# Patient Record
Sex: Female | Born: 1940 | Race: Black or African American | Hispanic: No | Smoking: Never smoker
Health system: Southern US, Community
[De-identification: ages and names within clinical notes are randomized; demographics above are authoritative.]

## PROBLEM LIST (undated history)

## (undated) DIAGNOSIS — E785 Hyperlipidemia, unspecified: Secondary | ICD-10-CM

## (undated) DIAGNOSIS — Z923 Personal history of irradiation: Secondary | ICD-10-CM

## (undated) DIAGNOSIS — G4730 Sleep apnea, unspecified: Secondary | ICD-10-CM

## (undated) DIAGNOSIS — H9190 Unspecified hearing loss, unspecified ear: Secondary | ICD-10-CM

## (undated) DIAGNOSIS — T7840XA Allergy, unspecified, initial encounter: Secondary | ICD-10-CM

## (undated) DIAGNOSIS — Z973 Presence of spectacles and contact lenses: Secondary | ICD-10-CM

## (undated) DIAGNOSIS — E079 Disorder of thyroid, unspecified: Secondary | ICD-10-CM

## (undated) DIAGNOSIS — J302 Other seasonal allergic rhinitis: Secondary | ICD-10-CM

## (undated) DIAGNOSIS — C50919 Malignant neoplasm of unspecified site of unspecified female breast: Secondary | ICD-10-CM

## (undated) DIAGNOSIS — H269 Unspecified cataract: Secondary | ICD-10-CM

## (undated) DIAGNOSIS — G709 Myoneural disorder, unspecified: Secondary | ICD-10-CM

## (undated) DIAGNOSIS — K219 Gastro-esophageal reflux disease without esophagitis: Secondary | ICD-10-CM

## (undated) DIAGNOSIS — H409 Unspecified glaucoma: Secondary | ICD-10-CM

## (undated) DIAGNOSIS — M1990 Unspecified osteoarthritis, unspecified site: Secondary | ICD-10-CM

## (undated) DIAGNOSIS — C801 Malignant (primary) neoplasm, unspecified: Secondary | ICD-10-CM

## (undated) DIAGNOSIS — D649 Anemia, unspecified: Secondary | ICD-10-CM

## (undated) DIAGNOSIS — F419 Anxiety disorder, unspecified: Secondary | ICD-10-CM

## (undated) HISTORY — DX: Sleep apnea, unspecified: G47.30

## (undated) HISTORY — PX: LEFT OOPHORECTOMY: SHX1961

## (undated) HISTORY — PX: COLONOSCOPY: SHX174

## (undated) HISTORY — DX: Myoneural disorder, unspecified: G70.9

## (undated) HISTORY — DX: Disorder of thyroid, unspecified: E07.9

## (undated) HISTORY — DX: Gastro-esophageal reflux disease without esophagitis: K21.9

## (undated) HISTORY — DX: Anemia, unspecified: D64.9

## (undated) HISTORY — DX: Unspecified glaucoma: H40.9

## (undated) HISTORY — PX: BREAST LUMPECTOMY: SHX2

## (undated) HISTORY — DX: Unspecified cataract: H26.9

## (undated) HISTORY — DX: Anxiety disorder, unspecified: F41.9

## (undated) HISTORY — PX: EYE SURGERY: SHX253

## (undated) HISTORY — PX: ABDOMINAL HYSTERECTOMY: SHX81

## (undated) HISTORY — DX: Allergy, unspecified, initial encounter: T78.40XA

## (undated) HISTORY — DX: Hyperlipidemia, unspecified: E78.5

---

## 2000-12-29 ENCOUNTER — Ambulatory Visit (HOSPITAL_COMMUNITY): Admission: RE | Admit: 2000-12-29 | Discharge: 2000-12-29 | Payer: Self-pay | Admitting: Gastroenterology

## 2003-04-26 HISTORY — PX: BREAST EXCISIONAL BIOPSY: SUR124

## 2003-07-04 LAB — HM COLONOSCOPY: HM Colonoscopy: NORMAL

## 2006-04-25 LAB — HM COLONOSCOPY: HM Colonoscopy: NORMAL

## 2008-12-09 ENCOUNTER — Encounter (INDEPENDENT_AMBULATORY_CARE_PROVIDER_SITE_OTHER): Payer: Self-pay | Admitting: *Deleted

## 2008-12-22 ENCOUNTER — Ambulatory Visit: Payer: Self-pay | Admitting: Family Medicine

## 2008-12-22 DIAGNOSIS — J029 Acute pharyngitis, unspecified: Secondary | ICD-10-CM

## 2008-12-22 DIAGNOSIS — M542 Cervicalgia: Secondary | ICD-10-CM

## 2008-12-22 DIAGNOSIS — E785 Hyperlipidemia, unspecified: Secondary | ICD-10-CM | POA: Insufficient documentation

## 2008-12-22 DIAGNOSIS — R42 Dizziness and giddiness: Secondary | ICD-10-CM

## 2008-12-22 DIAGNOSIS — M810 Age-related osteoporosis without current pathological fracture: Secondary | ICD-10-CM | POA: Insufficient documentation

## 2008-12-22 DIAGNOSIS — J309 Allergic rhinitis, unspecified: Secondary | ICD-10-CM | POA: Insufficient documentation

## 2008-12-24 ENCOUNTER — Telehealth (INDEPENDENT_AMBULATORY_CARE_PROVIDER_SITE_OTHER): Payer: Self-pay | Admitting: *Deleted

## 2008-12-24 ENCOUNTER — Encounter: Payer: Self-pay | Admitting: Family Medicine

## 2008-12-24 LAB — CONVERTED CEMR LAB
ALT: 18 units/L (ref 0–35)
AST: 21 units/L (ref 0–37)
BUN: 16 mg/dL (ref 6–23)
CO2: 28 meq/L (ref 19–32)
Chloride: 110 meq/L (ref 96–112)
Creatinine, Ser: 1.1 mg/dL (ref 0.4–1.2)
Direct LDL: 169.6 mg/dL
Eosinophils Relative: 1.7 % (ref 0.0–5.0)
Free T4: 0.8 ng/dL (ref 0.6–1.6)
Glucose, Bld: 90 mg/dL (ref 70–99)
HCT: 36.9 % (ref 36.0–46.0)
HDL: 62.1 mg/dL (ref >39.00)
MCHC: 33.6 g/dL (ref 30.0–36.0)
Monocytes Absolute: 0.5 10*3/uL (ref 0.1–1.0)
Monocytes Relative: 11.3 % (ref 3.0–12.0)
Neutrophils Relative %: 39.8 % — ABNORMAL LOW (ref 43.0–77.0)
Platelets: 221 10*3/uL (ref 150.0–400.0)
RDW: 14 % (ref 11.5–14.6)
Sodium: 142 meq/L (ref 135–145)
T3, Free: 2.7 pg/mL (ref 2.3–4.2)
TSH: 5.2 microintl units/mL (ref 0.35–5.50)
Total Protein: 7.6 g/dL (ref 6.0–8.3)
VLDL: 16.8 mg/dL (ref 0.0–40.0)

## 2009-02-10 ENCOUNTER — Ambulatory Visit: Payer: Self-pay | Admitting: Family Medicine

## 2009-02-10 DIAGNOSIS — E559 Vitamin D deficiency, unspecified: Secondary | ICD-10-CM | POA: Insufficient documentation

## 2009-02-10 DIAGNOSIS — D239 Other benign neoplasm of skin, unspecified: Secondary | ICD-10-CM | POA: Insufficient documentation

## 2009-02-11 ENCOUNTER — Encounter: Payer: Self-pay | Admitting: Family Medicine

## 2009-02-11 ENCOUNTER — Telehealth (INDEPENDENT_AMBULATORY_CARE_PROVIDER_SITE_OTHER): Payer: Self-pay | Admitting: *Deleted

## 2009-03-05 ENCOUNTER — Encounter: Admission: RE | Admit: 2009-03-05 | Discharge: 2009-03-05 | Payer: Self-pay | Admitting: Family Medicine

## 2009-03-05 ENCOUNTER — Encounter: Payer: Self-pay | Admitting: Internal Medicine

## 2009-03-06 ENCOUNTER — Encounter: Payer: Self-pay | Admitting: Internal Medicine

## 2009-12-02 ENCOUNTER — Ambulatory Visit: Payer: Self-pay | Admitting: Family Medicine

## 2009-12-02 DIAGNOSIS — E039 Hypothyroidism, unspecified: Secondary | ICD-10-CM

## 2009-12-03 ENCOUNTER — Telehealth: Payer: Self-pay | Admitting: Family Medicine

## 2009-12-03 LAB — CONVERTED CEMR LAB
ALT: 16 units/L (ref 0–35)
Alkaline Phosphatase: 35 units/L — ABNORMAL LOW (ref 39–117)
Basophils Absolute: 0 10*3/uL (ref 0.0–0.1)
Basophils Relative: 0.3 % (ref 0.0–3.0)
Bilirubin, Direct: 0.1 mg/dL (ref 0.0–0.3)
CO2: 27 meq/L (ref 19–32)
Calcium: 9 mg/dL (ref 8.4–10.5)
Chloride: 105 meq/L (ref 96–112)
Eosinophils Absolute: 0.1 10*3/uL (ref 0.0–0.7)
Lymphocytes Relative: 48.1 % — ABNORMAL HIGH (ref 12.0–46.0)
Lymphs Abs: 2.2 10*3/uL (ref 0.7–4.0)
MCHC: 33.6 g/dL (ref 30.0–36.0)
Monocytes Absolute: 0.5 10*3/uL (ref 0.1–1.0)
Monocytes Relative: 10.7 % (ref 3.0–12.0)
Neutro Abs: 1.8 10*3/uL (ref 1.4–7.7)
RBC: 3.89 M/uL (ref 3.87–5.11)
RDW: 15.4 % — ABNORMAL HIGH (ref 11.5–14.6)
Total Bilirubin: 0.5 mg/dL (ref 0.3–1.2)
Total CHOL/HDL Ratio: 3
Total Protein: 6.7 g/dL (ref 6.0–8.3)
Triglycerides: 46 mg/dL (ref 0.0–149.0)
VLDL: 9.2 mg/dL (ref 0.0–40.0)
WBC: 4.5 10*3/uL (ref 4.5–10.5)

## 2010-03-25 ENCOUNTER — Ambulatory Visit: Payer: Self-pay | Admitting: Family Medicine

## 2010-03-25 ENCOUNTER — Other Ambulatory Visit
Admission: RE | Admit: 2010-03-25 | Discharge: 2010-03-25 | Payer: Self-pay | Source: Home / Self Care | Admitting: Family Medicine

## 2010-03-25 DIAGNOSIS — M21619 Bunion of unspecified foot: Secondary | ICD-10-CM

## 2010-03-25 LAB — CONVERTED CEMR LAB
Alkaline Phosphatase: 38 units/L — ABNORMAL LOW (ref 39–117)
BUN: 14 mg/dL (ref 6–23)
Basophils Absolute: 0 10*3/uL (ref 0.0–0.1)
Basophils Relative: 0.3 % (ref 0.0–3.0)
Bilirubin, Direct: 0.1 mg/dL (ref 0.0–0.3)
CO2: 30 meq/L (ref 19–32)
Calcium: 9.2 mg/dL (ref 8.4–10.5)
Chloride: 103 meq/L (ref 96–112)
Cholesterol: 171 mg/dL (ref 0–200)
Creatinine, Ser: 1.1 mg/dL (ref 0.4–1.2)
Eosinophils Relative: 1.7 % (ref 0.0–5.0)
GFR calc non Af Amer: 66.33 mL/min (ref >60)
Glucose, Bld: 88 mg/dL (ref 70–99)
HCT: 36.8 % (ref 36.0–46.0)
LDL Cholesterol: 95 mg/dL (ref 0–99)
Lymphocytes Relative: 44.6 % (ref 12.0–46.0)
Lymphs Abs: 1.8 10*3/uL (ref 0.7–4.0)
Neutro Abs: 1.7 10*3/uL (ref 1.4–7.7)
Neutrophils Relative %: 42.5 % — ABNORMAL LOW (ref 43.0–77.0)
Potassium: 4.3 meq/L (ref 3.5–5.1)
RDW: 15.1 % — ABNORMAL HIGH (ref 11.5–14.6)
Sodium: 139 meq/L (ref 135–145)
Total Bilirubin: 0.7 mg/dL (ref 0.3–1.2)
Total Protein: 7.2 g/dL (ref 6.0–8.3)
VLDL: 9.4 mg/dL (ref 0.0–40.0)

## 2010-03-26 LAB — CONVERTED CEMR LAB: Vit D, 25-Hydroxy: 49 ng/mL

## 2010-03-31 ENCOUNTER — Encounter (INDEPENDENT_AMBULATORY_CARE_PROVIDER_SITE_OTHER): Payer: Self-pay | Admitting: *Deleted

## 2010-04-08 ENCOUNTER — Encounter: Payer: Self-pay | Admitting: Family Medicine

## 2010-04-08 ENCOUNTER — Encounter
Admission: RE | Admit: 2010-04-08 | Discharge: 2010-04-08 | Payer: Self-pay | Source: Home / Self Care | Attending: Family Medicine | Admitting: Family Medicine

## 2010-05-03 ENCOUNTER — Encounter: Payer: Self-pay | Admitting: Family Medicine

## 2010-05-27 NOTE — Consult Note (Signed)
Summary: [facility]   Imported By: Lanelle Bal 05/10/2010 10:09:14  _____________________________________________________________________  External Attachment:    Type:   Image     Comment:   External Document

## 2010-05-27 NOTE — Assessment & Plan Note (Signed)
Summary: cpx & lab/cbs   Vital Signs:  Patient profile:   70 year old female Height:      64 inches Weight:      160 pounds BMI:     27.56 Pulse rate:   86 / minute BP sitting:   110 / 64  (left arm)  Vitals Entered By: Doristine Devoid CMA (March 25, 2010 8:10 AM) CC: CPX AND LABS W/ PAP   History of Present Illness: 70 yo woman here today for CPE w/ pap.  Due for mammogram (breast center).  colonoscopy UTD.  1) R bunion- progressively worsening, having difficulty finding shoes.  when wearing shoes develops a burning pain in her foot.  Preventive Screening-Counseling & Management  Alcohol-Tobacco     Alcohol drinks/day: <1     Smoking Status: never  Caffeine-Diet-Exercise     Does Patient Exercise: no      Drug Use:  never.    Current Medications (verified): 1)  Armour Thyroid 30 Mg Tabs (Thyroid) .... Take One Tablet Daily-Office Visit Due 2)  Nasonex 50 Mcg/act Susp (Mometasone Furoate) .... 2 Sprays Each Nostril Once Daily 3)  Fosamax 70 Mg Tabs (Alendronate Sodium) .Marland Kitchen.. 1 Tab Weekly 4)  Crestor 10 Mg Tabs (Rosuvastatin Calcium) .... Take 1 Tab By Mouth At Bedtime  Allergies (verified): 1)  ! Codeine  Past History:  Past medical, surgical, family and social histories (including risk factors) reviewed, and no changes noted (except as noted below).  Past Medical History: Reviewed history from 12/22/2008 and no changes required. Hyperlipidemia Osteoporosis  Past Surgical History: Reviewed history from 12/22/2008 and no changes required. hysterectomy- fibroids 1 ovary removed (not sure which side, thinks L)  Family History: Reviewed history from 12/22/2008 and no changes required. CAD-no HTN-mother DM-maternal aunt STROKE-grandfather COLON CA-no BREAST CA-no   Social History: Reviewed history from 12/22/2008 and no changes required. 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    Physical Exam  General:  Well-developed,well-nourished,in no acute distress; alert,appropriate and cooperative throughout examination Head:  Normocephalic and atraumatic without obvious abnormalities. No apparent alopecia or balding. Eyes:  No corneal or conjunctival inflammation noted. EOMI. Perrla. Funduscopic exam benign, without hemorrhages, exudates or papilledema. Vision grossly normal. Ears:  External ear exam shows no significant lesions or deformities.  Otoscopic examination reveals clear canals, tympanic membranes are intact bilaterally without bulging, retraction, inflammation or discharge. Hearing is grossly normal bilaterally. Nose:  edematous turbinates Mouth:  Oral mucosa and oropharynx without lesions or exudates.  Teeth in good repair.  + PND Neck:  No deformities, masses, or tenderness noted. Breasts:  No mass, nodules, thickening, tenderness, bulging, retraction, inflamation, nipple discharge or skin changes noted.   Lungs:  Normal respiratory effort, chest expands symmetrically. Lungs are clear to auscultation, no crackles or wheezes. Heart:  Normal rate and regular rhythm. S1 and S2 normal without gallop, murmur, click, rub or other extra sounds. Abdomen:  Bowel sounds positive,abdomen soft and non-tender without masses, organomegaly or hernias noted. Genitalia:  normal introitus, no external lesions, no vaginal discharge, mucosa pink and moist, no vaginal atrophy, and no adnexal masses or tenderness.  vaginal wall scraping obtained  Pulses:  +2 carotid, radial, DP Extremities:  No  clubbing, cyanosis, edema  R foot bunion- prominent Neurologic:  No cranial nerve  deficits noted. Station and gait are normal. Plantar reflexes are down-going bilaterally. DTRs are symmetrical throughout. Sensory, motor and coordinative functions appear intact. Skin:  Intact without suspicious lesions or rashes, multiple hyperpigmented areas consistent w/ 'age spots' Cervical Nodes:  No lymphadenopathy noted Axillary Nodes:  No palpable lymphadenopathy Psych:  Cognition and judgment appear intact. Alert and cooperative with normal attention span and concentration. No apparent delusions, illusions, hallucinations   Impression & Recommendations:  Problem # 1:  PHYSICAL EXAMINATION (ICD-V70.0) Assessment Unchanged pt's PE WNL.  check labs.  anticipatory guidance provided. Orders: TLB-BMP (Basic Metabolic Panel-BMET) (80048-METABOL) TLB-CBC Platelet - w/Differential (85025-CBCD) Specimen Handling (11914)  Problem # 2:  OSTEOPOROSIS (ICD-733.00) Assessment: Unchanged due for DEXA The following medications were removed from the medication list:    Vitamin D (ergocalciferol) 50000 Unit Caps (Ergocalciferol) .Marland Kitchen... 1 tablet once a week for 10 weeks. Her updated medication list for this problem includes:    Fosamax 70 Mg Tabs (Alendronate sodium) .Marland Kitchen... 1 tab weekly  Orders: T-Vitamin D (25-Hydroxy) [id number]) Radiology Referral (Radiology)  Problem # 3:  BUNION (ICD-727.1) Assessment: New  refer to podiatry  Orders: Podiatry Referral (Podiatry)  Complete Medication List: 1)  Armour Thyroid 30 Mg Tabs (Thyroid) .... Take one tablet daily-office visit due 2)  Nasonex 50 Mcg/act Susp (Mometasone furoate) .... 2 sprays each nostril once daily 3)  Fosamax 70 Mg Tabs (Alendronate sodium) .Marland Kitchen.. 1 tab weekly 4)  Crestor 10 Mg Tabs (Rosuvastatin calcium) .... Take 1 tab by mouth at bedtime  Other Orders: Venipuncture (86578) TLB-Lipid Panel (80061-LIPID) TLB-Hepatic/Liver Function Pnl (80076-HEPATIC) TLB-TSH (Thyroid Stimulating Hormone) (46962-XBM)  Patient  Instructions: 1)  Follow up in 6 months to recheck your cholesterol 2)  Your exam looks great!  Keep up the good work! 3)  We'll call you with your podiatry appt 4)  We'll notify you of your mammogram and bone density appt 5)  Call with any questions or concerns 6)  Happy Holidays!!   Orders Added: 1)  Venipuncture [36415] 2)  TLB-Lipid Panel [80061-LIPID] 3)  TLB-Hepatic/Liver Function Pnl [80076-HEPATIC] 4)  TLB-TSH (Thyroid Stimulating Hormone) [84443-TSH] 5)  TLB-BMP (Basic Metabolic Panel-BMET) [80048-METABOL] 6)  TLB-CBC Platelet - w/Differential [85025-CBCD] 7)  T-Vitamin D (25-Hydroxy) [84132-44010] 8)  Specimen Handling [99000] 9)  Podiatry Referral [Podiatry] 10)  Radiology Referral [Radiology] 11)  Est. Patient 65& > [27253]

## 2010-05-27 NOTE — Assessment & Plan Note (Signed)
Summary: followup on meds//kn   Vital Signs:  Patient profile:   70 year old female Height:      64 inches Weight:      157 pounds BMI:     27.05 Temp:     98.0 degrees F Pulse rate:   72 / minute BP sitting:   118 / 76  (left arm)  Vitals Entered By: Almeta Monas CMA Duncan Dull) (December 02, 2009 11:06 AM) CC: f/u pt c/o cugh and sore throat   History of Present Illness: 70 yo woman here today for  1) hypothyroid- taking Armour thyroid.  denies heat or cold intolerance.  no alopecia, brittle nails  2) Hyperlipidemia- stopped Crestor months ago.  due for labs  3) sore throat- R side discomfort, 'i get a lot of mucous'.  no difficulty w/ swallowing.  feels cough and sore throat improved when on the nasal spray.  stopped w/out reason.  acknowledges seasonal allergies, not currently treated.  4) cough- intermittant, persistant dry cough.  no fevers, chills.  Problems Prior to Update: 1)  Cough  (ICD-786.2) 2)  Hypothyroidism  (ICD-244.9) 3)  Benign Neoplasm of Skin Site Unspecified  (ICD-216.9) 4)  Vitamin D Deficiency  (ICD-268.9) 5)  Physical Examination  (ICD-V70.0) 6)  Neck Pain  (ICD-723.1) 7)  Sore Throat  (ICD-462) 8)  Rhinitis  (ICD-477.9) 9)  Vertigo  (ICD-780.4) 10)  Osteoporosis  (ICD-733.00) 11)  Hyperlipidemia  (ICD-272.4)  Current Medications (verified): 1)  Armour Thyroid 30 Mg Tabs (Thyroid) .... Take One Tablet Daily-Office Visit Due  Allergies (verified): 1)  ! 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 No apparent alopecia or balding. Nose:  edematous turbinates Mouth:  Oral mucosa and oropharynx without lesions or exudates.  Teeth in good repair.  + PND Neck:  No deformities, masses, or tenderness noted. Lungs:  Normal respiratory effort, chest expands symmetrically. Lungs are clear to auscultation, no crackles or wheezes. Heart:  Normal rate and regular rhythm. S1 and S2 normal without gallop, murmur, click, rub or other extra sounds. Pulses:  +2 carotid, radial, DP Extremities:  No clubbing, cyanosis, edema, or deformity noted   Impression & Recommendations:  Problem # 1:  HYPOTHYROIDISM (ICD-244.9) Assessment Unchanged due for labs.  asymptomatic at this time. Her updated medication list for this problem includes:    Armour Thyroid 30 Mg Tabs (Thyroid) .Marland Kitchen... Take one tablet daily-office visit due  Orders: Venipuncture ([address]) TLB-TSH (Thyroid Stimulating Hormone) (84443-TSH) TLB-BMP (Basic Metabolic Panel-BMET) (80048-METABOL) TLB-CBC Platelet - w/Differential (85025-CBCD)  Problem # 2:  HYPERLIPIDEMIA (ICD-272.4) Assessment: Unchanged no longer on meds, ran out.  will check labs and restart meds at appropriate dose. The following medications were removed from the medication list:    Crestor 10 Mg Tabs (Rosuvastatin calcium) .Marland Kitchen... Take one tablet daily  Orders: TLB-Lipid Panel (80061-LIPID) TLB-Hepatic/Liver Function Pnl (80076-HEPATIC)  Problem # 3:  SORE THROAT (ICD-462) Assessment: Unchanged pt's sxs again most likely related to PND.  restart nasal steroid.  if no improvement in sxs will refer to ENT.  Problem # 4:  COUGH (ICD-786.2) Assessment: Unchanged likely combo  of untreated PND and GERD.  start nasal steroids and PPI.  if no improvement will get CXR.  Complete Medication List: 1)  Armour Thyroid 30 Mg Tabs (Thyroid) .... Take one tablet daily-office visit due 2)  Nasonex 50 Mcg/act Susp (Mometasone  furoate) .... 2 sprays each nostril once daily 3)  Fosamax 70 Mg Tabs (Alendronate sodium) .Marland Kitchen.. 1 tab weekly  Other Orders: T-Vitamin D (25-Hydroxy) [id number])  Patient Instructions: 1)  Schedule your complete physical for October- you can eat before this appt 2)  We'll notify you of your lab results and adjust your cholesterol meds based on this 3)  Start the Nasonex daily for the post nasal drip 4)  Start over the counter Prilosec for the cough 5)  Call with any questions or concerns 6)  Welcome Back...it's been awhile! Prescriptions: FOSAMAX 70 MG TABS (ALENDRONATE SODIUM) 1 tab weekly  #4 x 11   Entered and Authorized by:   Neena Rhymes MD   Signed by:   Neena Rhymes MD on 12/02/2009   Method used:   Electronically to        [facility] [phone]* (retail)       [address]       [place], [place]  [address]       Ph: [phone]       Fax: [phone]   RxID:   [id number] NASONEX 50 MCG/ACT SUSP (MOMETASONE FUROATE) 2 sprays each nostril once daily  #1 x 3   Entered and Authorized by:   Neena Rhymes MD   Signed by:   Neena Rhymes MD on 12/02/2009   Method used:   Electronically to        [facility] [phone]* (retail)       [address]       [place], [place]  [address]       Ph: [phone]       Fax: [phone]   RxID:   [id number] ARMOUR THYROID 30 MG TABS (THYROID) take one tablet daily-OFFICE VISIT DUE  #30 x 6   Entered and Authorized by:   Neena Rhymes MD   Signed by:   Neena Rhymes MD on 12/02/2009   Method used:   Electronically to        [facility] [phone]* (retail)       [address]       [place], [place]  [address]       Ph: [phone]       Fax: [phone]   RxID:   [id number]

## 2010-05-27 NOTE — Letter (Signed)
Summary: Results Follow up Letter  [facility] [place]  [address] [place], [place] [address]   Phone: [phone]  Fax: [phone]    03/31/2010 MRN: [id number]  [facility] [address] [place], [place]  [address]  Dear Ms. Usery,  The following are the results of your recent test(s):  Test         Result    Pap Smear:        Normal __X___  Not Normal _____ Comments: no further paps needed due to hysterectomy

## 2010-05-27 NOTE — Progress Notes (Signed)
Summary: rx crestor, vit d  Phone Note Outgoing Call      New/Updated Medications: CRESTOR 10 MG TABS (ROSUVASTATIN CALCIUM) Take 1 tab by mouth at bedtime VITAMIN D (ERGOCALCIFEROL) 50000 UNIT CAPS (ERGOCALCIFEROL) 1 tablet once a week for 10 weeks. Prescriptions: VITAMIN D (ERGOCALCIFEROL) 50000 UNIT CAPS (ERGOCALCIFEROL) 1 tablet once a week for 10 weeks.  #4 x 3   Entered by:   Mervin Kung CMA (AAMA)   Authorized by:   Neena Rhymes MD   Signed by:   Mervin Kung CMA (AAMA) on 12/03/2009   Method used:   Electronically to        [facility] [phone]* (retail)       [address]       [place], [place]  [address]       Ph: [phone]       Fax: [phone]   RxID:   [id number] CRESTOR 10 MG TABS (ROSUVASTATIN CALCIUM) Take 1 tab by mouth at bedtime  #30 x 3   Entered by:   Mervin Kung CMA (AAMA)   Authorized by:   Neena Rhymes MD   Signed by:   Mervin Kung CMA (AAMA) on 12/03/2009   Method used:   Electronically to        [facility] [phone]* (retail)       [address]       [place], [place]  [address]       Ph: [phone]       Fax: [phone]   RxID:   [id number]

## 2010-08-23 ENCOUNTER — Other Ambulatory Visit: Payer: Self-pay | Admitting: *Deleted

## 2010-08-23 MED ORDER — THYROID 30 MG PO TABS: 30.0000 mg | ORAL_TABLET | Freq: Every day | ORAL | Status: DC

## 2010-08-23 NOTE — Telephone Encounter (Signed)
Pt will be due for cholesterol check in June 2012.

## 2010-09-01 ENCOUNTER — Encounter: Payer: Self-pay | Admitting: Family Medicine

## 2010-09-10 NOTE — Op Note (Signed)
[facility]  Patient:    Monica Diaz, Monica Diaz Visit Number: [id number] MRN: [id number]          Service Type: END Location: ENDO Attending Physician:  Charna Elizabeth Dictated by:   Anselmo Rod, M.D. Proc. Date: 12/29/00 Admit Date:  12/29/2000   CC:         Cala Bradford R. Renae Gloss, M.D.   Operative Report  DATE OF BIRTH:  09-20-1940.  PROCEDURE:  Colonoscopy.  ENDOSCOPIST:  Anselmo Rod, M.D.  INSTRUMENT USED:  Olympus video colonoscope.  INDICATION FOR PROCEDURE:  Blood in stool in a 70 year old African-American female.  Patient also has a recent change in bowel habits.  Rule out colonic polyps, masses, hemorrhoids, etc.  PREPROCEDURE PREPARATION:  Informed consent was procured from the patient. The patient was fasted for eight hours prior to the procedure and prepped with a bottle of magnesium citrate and a gallon of NuLytely the night prior to the procedure.  PREPROCEDURE PHYSICAL:  VITAL SIGNS:  The patient had stable vital signs.  NECK:  Supple.  CHEST:  Clear to auscultation.  S1, S2 regular.  ABDOMEN:  Soft with normal bowel sounds.  DESCRIPTION OF PROCEDURE:  The patient was placed in the left lateral decubitus position and sedated with 60 mg of Demerol and 7 mg of Versed intravenously.  Once the patient was adequately sedate and maintained on low-flow oxygen and continuous cardiac monitoring, the Olympus video colonoscope was advanced from the rectum to the cecum with extreme difficulty secondary to a large amount of residual stool in the colon.  No masses, polyps, erosions, ulcerations, or diverticula were seen.  IMPRESSION:  Normal colon.  Small lesions could have been missed secondary to an inadequate prep.  RECOMMENDATIONS: 1. Continue high-fiber diet. 2. Outpatient follow-up within the next four weeks.  Further recommendations    will be made at that time. Dictated by:   Anselmo Rod, M.D. Attending  Physician:  Charna Elizabeth DD:  12/29/00 TD:  12/30/00 Job: [id number]

## 2010-11-25 ENCOUNTER — Other Ambulatory Visit: Payer: Self-pay | Admitting: Family Medicine

## 2010-11-25 MED ORDER — THYROID 30 MG PO TABS: 30.0000 mg | ORAL_TABLET | Freq: Every day | ORAL | Status: DC

## 2010-11-25 NOTE — Telephone Encounter (Signed)
done

## 2011-03-30 ENCOUNTER — Other Ambulatory Visit: Payer: Self-pay | Admitting: Family Medicine

## 2011-03-30 DIAGNOSIS — Z1231 Encounter for screening mammogram for malignant neoplasm of breast: Secondary | ICD-10-CM

## 2011-04-04 ENCOUNTER — Other Ambulatory Visit: Payer: Self-pay | Admitting: Family Medicine

## 2011-04-05 NOTE — Telephone Encounter (Signed)
.  left message to have patient return my call. Per pt needs OV

## 2011-04-11 ENCOUNTER — Encounter: Payer: Self-pay | Admitting: Family Medicine

## 2011-04-11 ENCOUNTER — Ambulatory Visit (INDEPENDENT_AMBULATORY_CARE_PROVIDER_SITE_OTHER): Payer: BC Managed Care – PPO | Admitting: Family Medicine

## 2011-04-11 DIAGNOSIS — E785 Hyperlipidemia, unspecified: Secondary | ICD-10-CM

## 2011-04-11 DIAGNOSIS — E039 Hypothyroidism, unspecified: Secondary | ICD-10-CM

## 2011-04-11 DIAGNOSIS — E559 Vitamin D deficiency, unspecified: Secondary | ICD-10-CM

## 2011-04-11 NOTE — Telephone Encounter (Signed)
Pt came in for OV, MD Tabori wanted to wait til the labs came back before we sent thyroid medicine to see if any changes needed to be made

## 2011-04-11 NOTE — Progress Notes (Signed)
  Subjective:    Patient ID: Monica Diaz, female    DOB: 1940-11-21, 70 y.o.   MRN: [id number]  HPI Hypothyroid- overdue on labs.  Needs refill on meds.  Denies heat or cold intolerance, denies changes to skin/hair/nails.  Hyperlipidemia- has been managing w/ diet and exercise, not currently on meds.  Vit D deficiency- pt overdue for labs.  Need to determine whether prescription replacement tx is required.  Hx of osteoporosis- has stopped fosamax.  Due for CPE.  Of note, pt did not mention any of the concerns she brought up to the nurse- when asked if she had other concerns, she replied 'i guess i'm due for my physical- i'll be sure to schedule that'.   Review of Systems For ROS see HPI     Objective:   Physical Exam  Vitals reviewed. Constitutional: She is oriented to person, place, and time. She appears well-developed and well-nourished. No distress.  HENT:  Head: Normocephalic and atraumatic.  Eyes: Conjunctivae and EOM are normal. Pupils are equal, round, and reactive to light.  Neck: Normal range of motion. Neck supple. No thyromegaly present.  Cardiovascular: Normal rate, regular rhythm, normal heart sounds and intact distal pulses.   No murmur heard. Pulmonary/Chest: Effort normal and breath sounds normal. No respiratory distress.  Abdominal: Soft. She exhibits no distension. There is no tenderness.  Musculoskeletal: She exhibits no edema.  Lymphadenopathy:    She has no cervical adenopathy.  Neurological: She is alert and oriented to person, place, and time.  Skin: Skin is warm and dry.  Psychiatric: She has a normal mood and affect. Her behavior is normal.          Assessment & Plan:

## 2011-04-11 NOTE — Patient Instructions (Signed)
Schedule your complete physical at your convenience- you can eat before this We'll notify you of your lab results and make any adjustments as needed Call with any questions or concerns Happy Holidays!!!

## 2011-04-12 LAB — CBC WITH DIFFERENTIAL/PLATELET
Basophils Relative: 0.2 % (ref 0.0–3.0)
Eosinophils Absolute: 0.1 10*3/uL (ref 0.0–0.7)
Eosinophils Relative: 1.6 % (ref 0.0–5.0)
Hemoglobin: 11.9 g/dL — ABNORMAL LOW (ref 12.0–15.0)
Lymphs Abs: 2.4 10*3/uL (ref 0.7–4.0)
MCHC: 33.3 g/dL (ref 30.0–36.0)
MCV: 94 fl (ref 78.0–100.0)
Monocytes Absolute: 0.5 10*3/uL (ref 0.1–1.0)
Monocytes Relative: 13.6 % — ABNORMAL HIGH (ref 3.0–12.0)
Neutro Abs: 0.5 10*3/uL — ABNORMAL LOW (ref 1.4–7.7)
Platelets: 235 10*3/uL (ref 150.0–400.0)
RBC: 3.8 Mil/uL — ABNORMAL LOW (ref 3.87–5.11)
RDW: 15.1 % — ABNORMAL HIGH (ref 11.5–14.6)
WBC: 3.4 10*3/uL — ABNORMAL LOW (ref 4.5–10.5)

## 2011-04-12 LAB — LIPID PANEL
Cholesterol: 257 mg/dL — ABNORMAL HIGH (ref 0–200)
HDL: 74.1 mg/dL (ref >39.00)
Triglycerides: 61 mg/dL (ref 0.0–149.0)
VLDL: 12.2 mg/dL (ref 0.0–40.0)

## 2011-04-12 LAB — BASIC METABOLIC PANEL
BUN: 12 mg/dL (ref 6–23)
Chloride: 110 mEq/L (ref 96–112)
Creatinine, Ser: 0.9 mg/dL (ref 0.4–1.2)
Glucose, Bld: 88 mg/dL (ref 70–99)

## 2011-04-12 LAB — HEPATIC FUNCTION PANEL
Albumin: 4.1 g/dL (ref 3.5–5.2)
Bilirubin, Direct: 0 mg/dL (ref 0.0–0.3)

## 2011-04-12 LAB — LDL CHOLESTEROL, DIRECT: Direct LDL: 163.8 mg/dL

## 2011-04-13 ENCOUNTER — Ambulatory Visit: Payer: Self-pay

## 2011-04-14 ENCOUNTER — Other Ambulatory Visit: Payer: Self-pay | Admitting: Family Medicine

## 2011-04-14 MED ORDER — THYROID 30 MG PO TABS: 30.0000 mg | ORAL_TABLET | Freq: Every day | ORAL | Status: DC

## 2011-04-14 NOTE — Telephone Encounter (Signed)
rx sent to pharmacy by e-script  

## 2011-04-17 NOTE — Assessment & Plan Note (Signed)
Chronic problem, overdue for labs.  Not on meds.  Attempting to control w/ diet and exercise.

## 2011-04-17 NOTE — Assessment & Plan Note (Signed)
Chronic problem, due for labs.  Restart Vit D prn.

## 2011-04-17 NOTE — Assessment & Plan Note (Signed)
Chronic problem for pt.  Overdue on labs.  Adjust meds prn.

## 2011-04-20 ENCOUNTER — Ambulatory Visit
Admission: RE | Admit: 2011-04-20 | Discharge: 2011-04-20 | Disposition: A | Discharge status: Home / Self Care | Payer: BC Managed Care – PPO | Source: Ambulatory Visit | Attending: Family Medicine | Admitting: Family Medicine

## 2011-04-20 DIAGNOSIS — Z1231 Encounter for screening mammogram for malignant neoplasm of breast: Secondary | ICD-10-CM

## 2011-04-22 MED ORDER — ROSUVASTATIN CALCIUM 10 MG PO TABS: 10.0000 mg | ORAL_TABLET | Freq: Every day | ORAL | Status: DC

## 2011-04-22 NOTE — Progress Notes (Signed)
Addended by: Derry Lory A on: 04/22/2011 03:39 PM   Modules accepted: Orders

## 2011-05-13 ENCOUNTER — Ambulatory Visit (INDEPENDENT_AMBULATORY_CARE_PROVIDER_SITE_OTHER): Payer: BC Managed Care – PPO | Admitting: Family Medicine

## 2011-05-13 ENCOUNTER — Encounter: Payer: BC Managed Care – PPO | Admitting: Family Medicine

## 2011-05-13 ENCOUNTER — Telehealth: Payer: Self-pay | Admitting: *Deleted

## 2011-05-13 ENCOUNTER — Encounter: Payer: Self-pay | Admitting: Family Medicine

## 2011-05-13 DIAGNOSIS — D708 Other neutropenia: Secondary | ICD-10-CM

## 2011-05-13 DIAGNOSIS — M810 Age-related osteoporosis without current pathological fracture: Secondary | ICD-10-CM

## 2011-05-13 DIAGNOSIS — Z0000 Encounter for general adult medical examination without abnormal findings: Secondary | ICD-10-CM

## 2011-05-13 LAB — CBC WITH DIFFERENTIAL/PLATELET
Basophils Absolute: 0 10*3/uL (ref 0.0–0.1)
Basophils Relative: 0 % (ref 0–1)
Hemoglobin: 11.9 g/dL — ABNORMAL LOW (ref 12.0–15.0)
Lymphocytes Relative: 52 % — ABNORMAL HIGH (ref 12–46)
Monocytes Absolute: 0.4 10*3/uL (ref 0.1–1.0)
Neutro Abs: 1.7 10*3/uL (ref 1.7–7.7)
Neutrophils Relative %: 37 % — ABNORMAL LOW (ref 43–77)
Platelets: 278 10*3/uL (ref 150–400)
RBC: 3.98 MIL/uL (ref 3.87–5.11)
RDW: 13.9 % (ref 11.5–15.5)
WBC: 4.6 10*3/uL (ref 4.0–10.5)

## 2011-05-13 MED ORDER — MOMETASONE FUROATE 50 MCG/ACT NA SUSP: 2.0000 | Freq: Every day | NASAL | Status: DC

## 2011-05-13 NOTE — Progress Notes (Signed)
  Subjective:    Patient ID: Monica Diaz, female    DOB: [birth year], 71 y.o.   MRN: [id number]  HPI Here today for CPE.  Physical Activity: plans on starting exercise program now that she retired Fall Risk: steady on feet, very low risk Depression: denies sxs Hearing: decreased to whispered voice ADL's: independent Cognitive: normal linear thought process, memory and attention intact Home Safety: safe at home Height, Weight, BMI, Visual Acuity: see vitals, vision corrected to 20/20 w/ glasses Counseling: UTD on mammo, had colonoscopy 3 yrs ago w/ Dr Loreta Ave, overdue for DEXA Labs Ordered: See A&P Care Plan: See A&P    Review of Systems Patient reports no vision/ hearing changes, adenopathy,fever, weight change,  persistant/recurrent hoarseness , swallowing issues, chest pain, palpitations, edema, persistant/recurrent cough, hemoptysis, dyspnea (rest/exertional/paroxysmal nocturnal), gastrointestinal bleeding (melena, rectal bleeding), abdominal pain, significant heartburn, bowel changes, GU symptoms (dysuria, hematuria, incontinence), Gyn symptoms (abnormal  bleeding, pain),  syncope, focal weakness, memory loss, numbness & tingling, skin/hair/nail changes, abnormal bruising or bleeding, anxiety, or depression.     Objective:   Physical Exam General Appearance:    Alert, cooperative, no distress, appears stated age  Head:    Normocephalic, without obvious abnormality, atraumatic  Eyes:    PERRL, conjunctiva/corneas clear, EOM's intact, fundi    benign, both eyes  Ears:    Normal TM's and external ear canals, both ears  Nose:   Nares normal, septum midline, mucosa normal, no drainage    or sinus tenderness  Throat:   Lips, mucosa, and tongue normal; teeth and gums normal  Neck:   Supple, symmetrical, trachea midline, no adenopathy;    Thyroid: no enlargement/tenderness/nodules  Back:     Symmetric, no curvature, ROM normal, no CVA tenderness  Lungs:     Clear to auscultation  bilaterally, respirations unlabored  Chest Wall:    No tenderness or deformity   Heart:    Regular rate and rhythm, S1 and S2 normal, no murmur, rub   or gallop  Breast Exam:    Deferred at pt's request  Abdomen:     Soft, non-tender, bowel sounds active all four quadrants,    no masses, no organomegaly  Genitalia:    Deferred at pt's request  Rectal:    Extremities:   Extremities normal, atraumatic, no cyanosis or edema  Pulses:   2+ and symmetric all extremities  Skin:   Skin color, texture, turgor normal, no rashes or lesions  Lymph nodes:   Cervical, supraclavicular, and axillary nodes normal  Neurologic:   CNII-XII intact, normal strength, sensation and reflexes    throughout          Assessment & Plan:

## 2011-05-13 NOTE — Assessment & Plan Note (Signed)
Overdue for DEXA- will refer.

## 2011-05-13 NOTE — Telephone Encounter (Signed)
Pt called concerned about her abnormal labs per appt was cancelled due to office opening late, spoke with MD Tabori and set up appt for 1pm for exam, advised pt she can eat per does not have to be fasting for her labs per MD Tabori, pt understood and accepted appt

## 2011-05-13 NOTE — Assessment & Plan Note (Signed)
Pt's PE WNL.  UTD on colonoscopy and mammo- no need for paps due to TAH-BSO.  Labs reviewed from previous visit.  Anticipatory guidance provided.

## 2011-05-13 NOTE — Patient Instructions (Signed)
Schedule a follow up in 6 months to recheck your cholesterol Someone will call you with your bone density appt (We'll hold off on the chest xray until Medicare- they cover this) We'll notify you of your lab results Call with any questions or concerns Keep up the good work!  You look great! Enjoy DC!

## 2011-05-13 NOTE — Assessment & Plan Note (Signed)
Repeat CBC

## 2011-05-18 MED ORDER — ALENDRONATE SODIUM 70 MG PO TABS: 70.0000 mg | ORAL_TABLET | ORAL | Status: DC

## 2011-05-18 NOTE — Progress Notes (Signed)
Addended by: Derry Lory A on: 05/18/2011 12:04 PM   Modules accepted: Orders

## 2011-08-12 ENCOUNTER — Other Ambulatory Visit: Payer: Self-pay | Admitting: Family Medicine

## 2011-08-12 MED ORDER — THYROID 30 MG PO TABS: 30.0000 mg | ORAL_TABLET | Freq: Every day | ORAL | Status: DC

## 2011-08-12 NOTE — Telephone Encounter (Signed)
rx sent to pharmacy by e-script  

## 2011-08-12 NOTE — Telephone Encounter (Signed)
Refill for Thyroid Armour 0.5GR 30MG  Tabs Qty 30 Take 1-tablet by mouth daily Last filled 3.23.13  Last OV 1.18.13

## 2011-12-20 ENCOUNTER — Other Ambulatory Visit: Payer: Self-pay | Admitting: Family Medicine

## 2011-12-20 NOTE — Telephone Encounter (Signed)
Pt to have apt tomorrow per protocol will await lab results prior to sending medication refill per possible med changes

## 2011-12-20 NOTE — Telephone Encounter (Signed)
Refill Thyroid (Tab) ARMOUR 30 MG Take 1 tablet (30 mg total) by mouth daily #30 last fill 7.31.13 last ov 1.18.13 V70

## 2011-12-20 NOTE — Telephone Encounter (Signed)
Refill Alendronate Sodium (Tab) 70 MG Take 1 tablet (70 mg total) by mouth every 7 (seven) days. Take with a full glass of water on an empty stomach. #4, last filll 7.31.13

## 2011-12-21 ENCOUNTER — Encounter: Payer: Self-pay | Admitting: Family Medicine

## 2011-12-21 ENCOUNTER — Ambulatory Visit (INDEPENDENT_AMBULATORY_CARE_PROVIDER_SITE_OTHER): Payer: Medicare Other | Admitting: Family Medicine

## 2011-12-21 ENCOUNTER — Encounter: Payer: Self-pay | Admitting: *Deleted

## 2011-12-21 VITALS — BP 123/75 | HR 77 | Temp 97.9°F | Ht 63.75 in | Wt 162.2 lb

## 2011-12-21 DIAGNOSIS — E785 Hyperlipidemia, unspecified: Secondary | ICD-10-CM

## 2011-12-21 DIAGNOSIS — J309 Allergic rhinitis, unspecified: Secondary | ICD-10-CM

## 2011-12-21 DIAGNOSIS — E039 Hypothyroidism, unspecified: Secondary | ICD-10-CM

## 2011-12-21 DIAGNOSIS — M542 Cervicalgia: Secondary | ICD-10-CM

## 2011-12-21 LAB — LIPID PANEL
Cholesterol: 145 mg/dL (ref 0–200)
HDL: 74.9 mg/dL (ref >39.00)
LDL Cholesterol: 61 mg/dL (ref 0–99)
Total CHOL/HDL Ratio: 2
Triglycerides: 48 mg/dL (ref 0.0–149.0)
VLDL: 9.6 mg/dL (ref 0.0–40.0)

## 2011-12-21 LAB — HEPATIC FUNCTION PANEL
ALT: 20 U/L (ref 0–35)
AST: 26 U/L (ref 0–37)
Albumin: 4.1 g/dL (ref 3.5–5.2)
Alkaline Phosphatase: 36 U/L — ABNORMAL LOW (ref 39–117)
Bilirubin, Direct: 0.1 mg/dL (ref 0.0–0.3)
Total Bilirubin: 0.4 mg/dL (ref 0.3–1.2)
Total Protein: 7.6 g/dL (ref 6.0–8.3)

## 2011-12-21 LAB — TSH: TSH: 2.48 u[IU]/mL (ref 0.35–5.50)

## 2011-12-21 NOTE — Assessment & Plan Note (Signed)
Recurrent issue.  Asymptomatic today.  Pt feels claritin improved her sxs- not clear on how this works but since pt is asymptomatic will not investigate further at this time.

## 2011-12-21 NOTE — Assessment & Plan Note (Signed)
Chronic problem.  Pt feels thyroid dose is appropriate.  Currently asymptomatic.  Due for labs.  Adjust meds prn.

## 2011-12-21 NOTE — Assessment & Plan Note (Signed)
Chronic problem.  Encouraged pt to continue Claritin and nasal steroid daily.  Will continue to follow.

## 2011-12-21 NOTE — Patient Instructions (Addendum)
Schedule your complete physical after 1/18 We'll notify you of your lab results and make any changes if needed Keep up the good work! Continue the Claritin and nasal spray daily If your head/neck pain returns- please call! Enjoy your trip!!!

## 2011-12-21 NOTE — Progress Notes (Signed)
  Subjective:    Patient ID: Monica Diaz, female    DOB: 05-29-1940, 71 y.o.   MRN: [id number]  HPI Hyperlipidemia- chronic problem, on Crestor.  No abd pain, N/V, myalgias.  Hypothyroid- chronic problem, on 30mg  Armour thyroid.  Denies fatigue, heat/cold intolerance.  R head/neck pain- going on for several months but has recently intensified.  Difficult to turn head, felt like a tight muscle.  sxs improved when she started Claritin regularly.  + nasal congestion, PND, no facial pain/pressure.  Denies increased stress.   Review of Systems For ROS see HPI     Objective:   Physical Exam  Vitals reviewed. Constitutional: She appears well-developed and well-nourished. No distress.  HENT:  Head: Normocephalic and atraumatic.  Right Ear: Tympanic membrane normal.  Left Ear: Tympanic membrane normal.  Nose: Mucosal edema and rhinorrhea present. Right sinus exhibits no maxillary sinus tenderness and no frontal sinus tenderness. Left sinus exhibits no maxillary sinus tenderness and no frontal sinus tenderness.  Mouth/Throat: Mucous membranes are normal. Posterior oropharyngeal erythema (w/ PND) present.  Eyes: Conjunctivae and EOM are normal. Pupils are equal, round, and reactive to light.  Neck: Normal range of motion. Neck supple. No thyromegaly present.       No pain w/ palpation, no trap spasm, and no limitation of motion  Cardiovascular: Normal rate, regular rhythm and normal heart sounds.   Pulmonary/Chest: Effort normal and breath sounds normal. No respiratory distress. She has no wheezes. She has no rales.  Abdominal: Soft. Bowel sounds are normal. She exhibits no distension. There is no tenderness. There is no rebound.  Lymphadenopathy:    She has no cervical adenopathy.          Assessment & Plan:

## 2011-12-21 NOTE — Assessment & Plan Note (Signed)
Chronic problem, tolerating statin w/out difficulty.  Due for labs.  Adjust meds prn. 

## 2011-12-23 MED ORDER — THYROID 30 MG PO TABS: 30.0000 mg | ORAL_TABLET | Freq: Every day | ORAL | Status: DC

## 2011-12-23 MED ORDER — ALENDRONATE SODIUM 70 MG PO TABS: 70.0000 mg | ORAL_TABLET | ORAL | Status: DC

## 2011-12-23 NOTE — Telephone Encounter (Signed)
rx sent to pharmacy by e-script  

## 2012-01-09 ENCOUNTER — Telehealth: Payer: Self-pay | Admitting: Family Medicine

## 2012-01-09 NOTE — Telephone Encounter (Signed)
Refill: Crestor 10mg  tablets. Take 1 tablet by mouth every day.. Qty 30. Last fill 6.6.13

## 2012-01-10 ENCOUNTER — Other Ambulatory Visit: Payer: Self-pay | Admitting: Family Medicine

## 2012-01-10 MED ORDER — ROSUVASTATIN CALCIUM 10 MG PO TABS: 10.0000 mg | ORAL_TABLET | Freq: Every day | ORAL | Status: DC

## 2012-01-10 NOTE — Telephone Encounter (Signed)
Medication refilled by e-scribe.

## 2012-03-20 ENCOUNTER — Ambulatory Visit (INDEPENDENT_AMBULATORY_CARE_PROVIDER_SITE_OTHER): Payer: Medicare Other | Admitting: Family Medicine

## 2012-03-20 VITALS — BP 100/80 | HR 79 | Wt 165.0 lb

## 2012-03-20 DIAGNOSIS — G4752 REM sleep behavior disorder: Secondary | ICD-10-CM | POA: Insufficient documentation

## 2012-03-20 DIAGNOSIS — S0510XA Contusion of eyeball and orbital tissues, unspecified eye, initial encounter: Secondary | ICD-10-CM

## 2012-03-20 NOTE — Patient Instructions (Addendum)
Please place your mattress on the floor to prevent additional harm Clear furniture from the immediate vicinity We'll call you with your neuro appt and try and get this ASAP Call with any questions or concerns Happy Holidays!!

## 2012-03-20 NOTE — Progress Notes (Signed)
  Subjective:    Patient ID: Monica Diaz, female    DOB: [birth year], 71 y.o.   MRN: [id number]  HPI Black eye- 'i'm going to kill myself in my own bedroom'.  Having very 'vivid, interactive' dreams.  sxs started 4-5 yrs ago.  Was occuring infrequently previously but now happening more often.  In Sept jumped out of bed, hit head on corner of nightstand, broke 3 fingernails and had knot on side of head.  Friday night had violent dream and hit her R eye on brass handle of dresser- black eye, hematoma of R forehead.  Not taking sleep aides.  Not on any psychotropic meds.   Review of Systems For ROS see HPI     Objective:   Physical Exam  Vitals reviewed. Constitutional: She is oriented to person, place, and time. She appears well-developed and well-nourished. No distress.  HENT:       R forehead hematoma- mildly TTP but w/out obvious bony deformity  Eyes: Conjunctivae normal and EOM are normal. Pupils are equal, round, and reactive to light.       R orbit tender and ecchymotic w/out obvious bony deformity  Neck: Normal range of motion. Neck supple.  Lymphadenopathy:    She has no cervical adenopathy.  Neurological: She is alert and oriented to person, place, and time. No cranial nerve deficit. Coordination normal.  Skin: Skin is warm and dry.  Psychiatric: She has a normal mood and affect. Her behavior is normal. Thought content normal.          Assessment & Plan:

## 2012-03-20 NOTE — Assessment & Plan Note (Signed)
New.  Pt reports she has had vivid interactive dreams for 'years' but these have been worsening and twice in the last 2 months has injured herself.  Not on any psychotropic meds that could cause this.  Reviewed bedroom modifications for safety.  Will refer to neuro for complete evaluation.  Pt expressed understanding and is in agreement w/ plan.

## 2012-03-20 NOTE — Assessment & Plan Note (Signed)
New.  Self inflicted during sleep activity.  No evidence of bony deformity.  No pain over globe.  Reviewed supportive care.

## 2012-04-02 ENCOUNTER — Other Ambulatory Visit: Payer: Self-pay | Admitting: Family Medicine

## 2012-04-02 DIAGNOSIS — Z1231 Encounter for screening mammogram for malignant neoplasm of breast: Secondary | ICD-10-CM

## 2012-04-20 ENCOUNTER — Ambulatory Visit (HOSPITAL_COMMUNITY)
Admission: RE | Admit: 2012-04-20 | Discharge: 2012-04-20 | Disposition: A | Discharge status: Home / Self Care | Payer: Medicare Other | Source: Ambulatory Visit | Attending: Family Medicine | Admitting: Family Medicine

## 2012-04-20 DIAGNOSIS — M810 Age-related osteoporosis without current pathological fracture: Secondary | ICD-10-CM

## 2012-04-20 DIAGNOSIS — Z13820 Encounter for screening for osteoporosis: Secondary | ICD-10-CM | POA: Insufficient documentation

## 2012-04-20 DIAGNOSIS — Z1231 Encounter for screening mammogram for malignant neoplasm of breast: Secondary | ICD-10-CM

## 2012-05-15 ENCOUNTER — Encounter: Payer: Self-pay | Admitting: Family Medicine

## 2012-05-15 ENCOUNTER — Ambulatory Visit (INDEPENDENT_AMBULATORY_CARE_PROVIDER_SITE_OTHER): Payer: BC Managed Care – PPO | Admitting: Family Medicine

## 2012-05-15 VITALS — BP 128/68 | HR 71 | Temp 98.1°F | Ht 65.0 in | Wt 168.4 lb

## 2012-05-15 DIAGNOSIS — E785 Hyperlipidemia, unspecified: Secondary | ICD-10-CM

## 2012-05-15 DIAGNOSIS — E559 Vitamin D deficiency, unspecified: Secondary | ICD-10-CM

## 2012-05-15 DIAGNOSIS — E039 Hypothyroidism, unspecified: Secondary | ICD-10-CM

## 2012-05-15 DIAGNOSIS — Z0000 Encounter for general adult medical examination without abnormal findings: Secondary | ICD-10-CM

## 2012-05-15 DIAGNOSIS — G4733 Obstructive sleep apnea (adult) (pediatric): Secondary | ICD-10-CM

## 2012-05-15 LAB — CBC WITH DIFFERENTIAL/PLATELET
Basophils Absolute: 0 10*3/uL (ref 0.0–0.1)
Basophils Relative: 0.2 % (ref 0.0–3.0)
Eosinophils Absolute: 0.1 10*3/uL (ref 0.0–0.7)
HCT: 35.3 % — ABNORMAL LOW (ref 36.0–46.0)
Hemoglobin: 11.8 g/dL — ABNORMAL LOW (ref 12.0–15.0)
Lymphocytes Relative: 41.7 % (ref 12.0–46.0)
MCV: 90.7 fl (ref 78.0–100.0)
WBC: 4.5 10*3/uL (ref 4.5–10.5)

## 2012-05-15 LAB — HEPATIC FUNCTION PANEL
ALT: 21 U/L (ref 0–35)
AST: 25 U/L (ref 0–37)
Alkaline Phosphatase: 36 U/L — ABNORMAL LOW (ref 39–117)
Bilirubin, Direct: 0 mg/dL (ref 0.0–0.3)
Total Bilirubin: 0.4 mg/dL (ref 0.3–1.2)

## 2012-05-15 LAB — LIPID PANEL
HDL: 66.1 mg/dL (ref >39.00)
LDL Cholesterol: 56 mg/dL (ref 0–99)
VLDL: 13.4 mg/dL (ref 0.0–40.0)

## 2012-05-15 LAB — BASIC METABOLIC PANEL
Chloride: 105 mEq/L (ref 96–112)
GFR: 73.02 mL/min (ref >60.00)
Potassium: 3.7 mEq/L (ref 3.5–5.1)
Sodium: 137 mEq/L (ref 135–145)

## 2012-05-15 NOTE — Assessment & Plan Note (Signed)
Pt's PE WNL.  UTD on mammo, DEXA, colonoscopy.  Check labs.  Anticipatory guidance provided.  

## 2012-05-15 NOTE — Patient Instructions (Addendum)
Follow up in 6 months to recheck cholesterol Keep up the good work!  You look great! We'll notify you of your lab results Call Neuro and set up your follow up appt Call with any questions or concerns Have a great trip! Happy Early [place] Ouch!!!

## 2012-05-15 NOTE — Assessment & Plan Note (Signed)
Chronic problem.  Currently asymptomatic.  Check labs.  Adjust meds prn  

## 2012-05-15 NOTE — Assessment & Plan Note (Signed)
New to provider.  Pt upset b/c she had sleep study done but no f/u OV w/ MD to discuss results.  Pt now wearing CPAP nightly but has multiple questions she would like answered.  Encouraged her to set up appt w/ Neuro to review test results and get answers to her questions.  Will follow.

## 2012-05-15 NOTE — Assessment & Plan Note (Signed)
Chronic problem.  Tolerating statin w/out difficulty.  Check labs.  Adjust meds prn  

## 2012-05-15 NOTE — Progress Notes (Signed)
  Subjective:    Patient ID: Monica Diaz, female    DOB: 06-Mar-1941, 72 y.o.   MRN: [id number]  HPI Here today for CPE.  Risk Factors: Hyperlipidemia- chronic problem, on Crestor nightly.  Denies abd pain, N/V, myalgias. Hypothyroid- chronic problem, on Armour thyroid.  Denies fatigue, palpitations, heat/cold intolerance. Vit D deficiency/osteopenia- UTD on DEXA, now taking CitraCal daily.  Due for Vit D level. Physical Activity: limited exercise, plans to start Silver Sneakers Fall Risk: low risk, only fall occurred during a dream Depression: denies current symptoms Hearing: normal to conversational tones and whispered voice ADL's: independent Cognitive: normal linear thought process, memory/attention intact Home Safety: safe at home Height, Weight, BMI, Visual Acuity: see vitals, vision corrected to 20/20 w/ glasses Counseling: UTD on mammo, DEXA, colonoscopy (3-4 yrs ago w/ Dr Loreta Ave) Labs Ordered: See A&P Care Plan: See A&P    Review of Systems Patient reports no vision/ hearing changes, adenopathy,fever, weight change,  persistant/recurrent hoarseness , swallowing issues, chest pain, palpitations, edema, persistant/recurrent cough, hemoptysis, dyspnea (rest/exertional/paroxysmal nocturnal), gastrointestinal bleeding (melena, rectal bleeding), abdominal pain, significant heartburn, bowel changes, GU symptoms (dysuria, hematuria, incontinence), Gyn symptoms (abnormal  bleeding, pain),  syncope, focal weakness, memory loss, numbness & tingling, skin/hair/nail changes, abnormal bruising or bleeding, anxiety, or depression.     Objective:   Physical Exam General Appearance:    Alert, cooperative, no distress, appears younger than stated age  Head:    Normocephalic, without obvious abnormality, atraumatic  Eyes:    PERRL, conjunctiva/corneas clear, EOM's intact, fundi    benign, both eyes  Ears:    Normal TM's and external ear canals, both ears  Nose:   Nares normal, septum  midline, mucosa normal, no drainage    or sinus tenderness  Throat:   Lips, mucosa, and tongue normal; teeth and gums normal  Neck:   Supple, symmetrical, trachea midline, no adenopathy;    Thyroid: no enlargement/tenderness/nodules  Back:     Symmetric, no curvature, ROM normal, no CVA tenderness  Lungs:     Clear to auscultation bilaterally, respirations unlabored  Chest Wall:    No tenderness or deformity   Heart:    Regular rate and rhythm, S1 and S2 normal, no murmur, rub   or gallop  Breast Exam:    Deferred to mammo  Abdomen:     Soft, non-tender, bowel sounds active all four quadrants,    no masses, no organomegaly  Genitalia:    Deferred due to TAH  Rectal:    Extremities:   Extremities normal, atraumatic, no cyanosis or edema  Pulses:   2+ and symmetric all extremities  Skin:   Skin color, texture, turgor normal, no rashes or lesions  Lymph nodes:   Cervical, supraclavicular, and axillary nodes normal  Neurologic:   CNII-XII intact, normal strength, sensation and reflexes    throughout          Assessment & Plan:

## 2012-05-15 NOTE — Assessment & Plan Note (Signed)
Check labs.  Replete prn. 

## 2012-05-16 ENCOUNTER — Encounter: Payer: Self-pay | Admitting: *Deleted

## 2012-05-18 LAB — VITAMIN D 1,25 DIHYDROXY
Vitamin D 1, 25 (OH)2 Total: 69 pg/mL (ref 18–72)
Vitamin D3 1, 25 (OH)2: 58 pg/mL

## 2012-05-23 ENCOUNTER — Encounter: Payer: Self-pay | Admitting: *Deleted

## 2012-07-09 ENCOUNTER — Telehealth: Payer: Self-pay | Admitting: Family Medicine

## 2012-07-09 NOTE — Telephone Encounter (Signed)
refill Thyroid (Tab) 30 MG Take 1 tablet (30 mg total) by mouth daily. #30 last fill 2.16.14

## 2012-07-11 MED ORDER — THYROID 30 MG PO TABS: 30.0000 mg | ORAL_TABLET | Freq: Every day | ORAL | Status: DC

## 2012-07-23 ENCOUNTER — Telehealth: Payer: Self-pay | Admitting: Family Medicine

## 2012-07-23 NOTE — Telephone Encounter (Signed)
Refill- alendronate 70mg  tablets(4pk). Take one tablet by mouth every 7 days, take with a full glass of water on an empty stomach. Qty 4 last fill 3.2.14

## 2012-07-24 NOTE — Telephone Encounter (Signed)
LM @ (4:55pm) asking the pt to RTC.//AB/CMA

## 2012-07-25 NOTE — Telephone Encounter (Signed)
Pt returned your call-please call back @ [phone]

## 2012-08-01 ENCOUNTER — Other Ambulatory Visit: Payer: Self-pay | Admitting: *Deleted

## 2012-08-01 MED ORDER — ALENDRONATE SODIUM 70 MG PO TABS: 70.0000 mg | ORAL_TABLET | ORAL | Status: DC

## 2012-09-19 ENCOUNTER — Other Ambulatory Visit: Payer: Self-pay | Admitting: Family Medicine

## 2012-09-20 NOTE — Telephone Encounter (Signed)
Med filled.  

## 2012-11-13 ENCOUNTER — Encounter: Payer: Self-pay | Admitting: Neurology

## 2012-11-23 NOTE — Progress Notes (Signed)
Quick Note:  Patient is compliant with CPAP use well over 90% , no changes necessary . CD ______

## 2012-11-24 ENCOUNTER — Encounter: Payer: Self-pay | Admitting: Neurology

## 2013-01-25 ENCOUNTER — Ambulatory Visit (INDEPENDENT_AMBULATORY_CARE_PROVIDER_SITE_OTHER): Payer: Medicare Other | Admitting: Nurse Practitioner

## 2013-01-25 ENCOUNTER — Encounter: Payer: Self-pay | Admitting: Nurse Practitioner

## 2013-01-25 ENCOUNTER — Ambulatory Visit: Payer: Self-pay | Admitting: Nurse Practitioner

## 2013-01-25 VITALS — BP 104/60 | HR 86 | Temp 98.7°F | Ht 64.25 in | Wt 165.0 lb

## 2013-01-25 DIAGNOSIS — G4733 Obstructive sleep apnea (adult) (pediatric): Secondary | ICD-10-CM

## 2013-01-25 DIAGNOSIS — R0609 Other forms of dyspnea: Secondary | ICD-10-CM

## 2013-01-25 DIAGNOSIS — G4739 Other sleep apnea: Secondary | ICD-10-CM

## 2013-01-25 DIAGNOSIS — G4752 REM sleep behavior disorder: Secondary | ICD-10-CM

## 2013-01-25 NOTE — Patient Instructions (Addendum)
Revisit in 6-8 month with NP or Dr. Vickey Huger, with CPAP machine.

## 2013-01-25 NOTE — Progress Notes (Signed)
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 As a younger woman, she  never was able to remember her dreams. Now she has vivid dreams,  starting about five years ago.  These are detailed and  with scarry and threatening  content. "Being mugged,  chasing after something, caught in a house on fire,  having intruders in the house, yelling for help." During one of these spells, she  injured herself, "poked"  her eye.   She started banging on pillows and awoke  from her activities,  embarrassed from this. Her family memembers  are concerned about her falling out of bed.  Her behavior started in the morning hours, and is very likely REM related. Epworth 2 points.  Thunderous snoring and apnea were witnessed by her daughters, who don't want to share a hotel room with her.   06-25-12 , patient underwent SPLIT study with a diagnostic AHi of 15.9 and titrated to 7 cm water. Her  download from DME 2-11 showed an AHI of 2.2 and todays in - office download shows again 2.1 AHi and  6.53 hours nightly use.   She is  compliant but has questions about alternatives therapies as she travels a lot and feels it's a hassle to "drag along".we discussed  TMJ and dental devices. She is travelling to [place] and [place]- and for african travel I would support rather a dental device even if less effective. education about OSA and Sleep study/ download data.   UPDATE 01/25/13 (LL):  Ms. Bonnet returns for follow up for sleep apnea.  Doing very well. She has brought in her machine and results have been downloaded.  She reports much more restful sleep and feeling refreshed when she awakens. AHI now 1.3. CMS compliance established, average hours of usage is 6 hours and 48 minutes, Usage days are 88/90, 98%.  Epworth today  is 2. She discussed getting a dental appliance for travel with her dentist, who refused to make her one, saying it was not advisable due to problems with jaw and teeth alignment that could result because of use.  She takes her machine with her whenever she travels, but has forgotten it a couple times on short trips.  She admits worry when she does not have it.  REVIEW OF SYSTEMS: Full 14 system review of systems performed and notable only for:  Ear/Nose/Throat: hearing loss  Skin: moles  Respiratory: cough   ALLERGIES: Allergies  Allergen Reactions  . Codeine     REACTION: sick to stomach    HOME MEDICATIONS: Outpatient Prescriptions Prior to Visit  Medication Sig Dispense Refill  . alendronate (FOSAMAX) 70 MG tablet Take 1 tablet (70 mg total) by mouth every 7 (seven) days. Take with a full glass of water on an empty stomach.  30 tablet  1  . Coenzyme Q10 (COQ10) 200 MG CAPS Take 1 capsule by mouth at bedtime.      . CRESTOR 10 MG tablet TAKE 1 TABLET BY MOUTH DAILY  30 tablet  3  . loratadine (CLARITIN) 10 MG tablet Take 10 mg by mouth daily.      . mometasone (NASONEX) 50 MCG/ACT nasal spray Place 2 sprays into the nose daily.  17 g  3  . Multiple Vitamins-Minerals (CENTRUM SILVER PO) Take 1 tablet by mouth.      Marland Kitchen  omeprazole (PRILOSEC) 10 MG capsule Take 10 mg by mouth.      . thyroid (ARMOUR) 30 MG tablet Take 1 tablet (30 mg total) by mouth daily.  30 tablet  11  . vitamin B-12 (CYANOCOBALAMIN) 50 MCG tablet Take 50 mcg by mouth.       No facility-administered medications prior to visit.    PAST MEDICAL HISTORY: Past Medical History  Diagnosis Date  . Hyperlipidemia   . Osteoporosis   . Thyroid disease     PAST SURGICAL HISTORY: Past Surgical History  Procedure Laterality Date  . Abdominal hysterectomy      fibroids  . Left oophorectomy      FAMILY HISTORY: Family History  Problem Relation Age of Onset  . Hypertension Mother   . Diabetes Maternal Aunt   .  Stroke      grandfather    SOCIAL HISTORY: History   Social History  . Marital Status: Divorced    Spouse Name: N/A    Number of Children: N/A  . Years of Education: N/A   Occupational History  . Not on file.   Social History Main Topics  . Smoking status: Never Smoker   . Smokeless tobacco: Not on file  . Alcohol Use: Yes     Comment: occasionally  . Drug Use: No  . Sexual Activity: Not on file   Other Topics Concern  . Not on file   Social History Narrative  . No narrative on file    PHYSICAL EXAM  Filed Vitals:   01/25/13 1102  BP: 104/60  Pulse: 86  Temp: 98.7 F (37.1 C)  TempSrc: Oral  Height: 5' 4.25" (1.632 m)  Weight: 165 lb (74.844 kg)   Body mass index is 28.1 kg/(m^2).  General: Patient is awake, alert and in no acute distress. Well developed and groomed. Head: Normocephalic.  Ears, Nose and Throat: Mallompatti 3.  Neck: Neck is supple. Respiratory: LCTA. Cardiovascular: No carotid artery bruits.  Heart is regular rate and rhythm with no murmurs. Skin: No rash. Bruising over the right  eye.  Trunk: Slender.   Neurologic Exam  Mental Status: Awake, alert and oriented to person, place and time.  Recent and remote memory, attention span, concentration and fund of knowledge are normal.  Language is fluent and comprehension intact. Cranial Nerves:  Pupils are equal and reactive to light.  Visual fields are full to confrontation.  Conjugate eye movements are full and symmetric.  Facial sensation and strength are symmetric.  Hearing is intact.  Palate elevated symmetrically and uvula is midline.  Shoulder shrug is symmetric.  Tongue is midline. Motor: Normal bulk and tone.  Full strength in the upper and lower extremities.  No cogwheeling and no  pronator drift. Sensory: Intact and symmetric to light touch, pinprick. Coordination: No ataxia or dysmetria on finger-nose or rapid alternating movement testing. Gait and Station: Narrow based gait, able to  walk on heels and toes. Romberg is negative. Reflexes: Deep tendon reflexes in the upper and lower extremities are present,  1+ and symmetric.    DIAGNOSTIC DATA (LABS, IMAGING, TESTING) - I reviewed patient records, labs, notes, testing and imaging myself where available.  Lab Results  Component Value Date   WBC 4.5 05/15/2012   HGB 11.8* 05/15/2012   HCT 35.3* 05/15/2012   MCV 90.7 05/15/2012   PLT 231.0 05/15/2012      Component Value Date/Time   NA 137 05/15/2012 1100   K 3.7 05/15/2012 1100  CL 105 05/15/2012 1100   CO2 26 05/15/2012 1100   GLUCOSE 81 05/15/2012 1100   BUN 12 05/15/2012 1100   CREATININE 1.0 05/15/2012 1100   CALCIUM 9.1 05/15/2012 1100   PROT 7.6 05/15/2012 1100   ALBUMIN 4.1 05/15/2012 1100   AST 25 05/15/2012 1100   ALT 21 05/15/2012 1100   ALKPHOS 36* 05/15/2012 1100   BILITOT 0.4 05/15/2012 1100   GFRNONAA 66.33 03/25/2010 0828   Lab Results  Component Value Date   CHOL 135 05/15/2012   HDL 66.10 05/15/2012   LDLCALC 56 05/15/2012   LDLDIRECT 163.8 04/11/2011   TRIG 67.0 05/15/2012   CHOLHDL 2 05/15/2012   No results found for this basename: HGBA1C   No results found for this basename: VITAMINB12   Lab Results  Component Value Date   TSH 2.10 05/15/2012    PSG/CPAP Reviewed     ASSESSMENT AND PLAN 1) Patient doing well with CPAP treatment 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 3 downloads compared, AHI now 1.3. CMS compliance established, average hours of usage is 6 hours and 48 minutes, Usage days are 88/90, 98%.  Epworth today is 2.   2) No more reported episodes of confusional arousals on CPAP . 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  Onset five years ago. Some of these led to a fall out of bed.  Medication list reviewed, and no medication found that may promote these changes.   Disposition: Rv in 6-8 month with NP or Dr. Vickey Huger, CPAP   Tawny Asal LAM,  MSN, NP-C 01/25/2013, 11:46 AM [facility] [address], [address] [place], [place] [address] [phone]

## 2013-02-12 NOTE — Progress Notes (Signed)
I agree with the discussion and plan as outlined  by Heide Guile, NP

## 2013-03-27 ENCOUNTER — Telehealth: Payer: Self-pay | Admitting: *Deleted

## 2013-03-27 NOTE — Telephone Encounter (Signed)
Called patient to inform her that we do not have the shingle vaccine and would need to reschedule the appointment for 03/28/2013.

## 2013-03-28 ENCOUNTER — Ambulatory Visit: Payer: Medicare Other

## 2013-04-12 ENCOUNTER — Other Ambulatory Visit: Payer: Self-pay

## 2013-04-12 DIAGNOSIS — Z1231 Encounter for screening mammogram for malignant neoplasm of breast: Secondary | ICD-10-CM

## 2013-04-15 ENCOUNTER — Ambulatory Visit
Admission: RE | Admit: 2013-04-15 | Discharge: 2013-04-15 | Disposition: A | Discharge status: Home / Self Care | Payer: Medicare Other | Source: Ambulatory Visit

## 2013-04-15 DIAGNOSIS — Z1231 Encounter for screening mammogram for malignant neoplasm of breast: Secondary | ICD-10-CM

## 2013-04-25 DIAGNOSIS — Z923 Personal history of irradiation: Secondary | ICD-10-CM

## 2013-04-25 DIAGNOSIS — C50919 Malignant neoplasm of unspecified site of unspecified female breast: Secondary | ICD-10-CM

## 2013-04-25 DIAGNOSIS — C801 Malignant (primary) neoplasm, unspecified: Secondary | ICD-10-CM

## 2013-04-25 HISTORY — PX: BREAST BIOPSY: SHX20

## 2013-04-25 HISTORY — DX: Malignant (primary) neoplasm, unspecified: C80.1

## 2013-04-25 HISTORY — DX: Malignant neoplasm of unspecified site of unspecified female breast: C50.919

## 2013-04-25 HISTORY — DX: Personal history of irradiation: Z92.3

## 2013-04-30 ENCOUNTER — Other Ambulatory Visit: Payer: Self-pay | Admitting: Family Medicine

## 2013-04-30 DIAGNOSIS — R928 Other abnormal and inconclusive findings on diagnostic imaging of breast: Secondary | ICD-10-CM

## 2013-05-06 ENCOUNTER — Ambulatory Visit
Admission: RE | Admit: 2013-05-06 | Discharge: 2013-05-06 | Disposition: A | Discharge status: Home / Self Care | Payer: Medicare PPO | Source: Ambulatory Visit | Attending: Family Medicine | Admitting: Family Medicine

## 2013-05-06 DIAGNOSIS — R928 Other abnormal and inconclusive findings on diagnostic imaging of breast: Secondary | ICD-10-CM

## 2013-05-09 ENCOUNTER — Other Ambulatory Visit: Payer: Self-pay | Admitting: Family Medicine

## 2013-05-09 DIAGNOSIS — R928 Other abnormal and inconclusive findings on diagnostic imaging of breast: Secondary | ICD-10-CM

## 2013-05-14 ENCOUNTER — Other Ambulatory Visit: Payer: Self-pay | Admitting: Family Medicine

## 2013-05-14 ENCOUNTER — Ambulatory Visit
Admission: RE | Admit: 2013-05-14 | Discharge: 2013-05-14 | Disposition: A | Discharge status: Home / Self Care | Payer: Medicare PPO | Source: Ambulatory Visit | Attending: Family Medicine | Admitting: Family Medicine

## 2013-05-14 DIAGNOSIS — N63 Unspecified lump in unspecified breast: Secondary | ICD-10-CM

## 2013-05-14 DIAGNOSIS — R928 Other abnormal and inconclusive findings on diagnostic imaging of breast: Secondary | ICD-10-CM

## 2013-05-15 ENCOUNTER — Other Ambulatory Visit: Payer: Self-pay | Admitting: Family Medicine

## 2013-05-15 ENCOUNTER — Telehealth: Payer: Self-pay | Admitting: General Practice

## 2013-05-15 ENCOUNTER — Ambulatory Visit
Admission: RE | Admit: 2013-05-15 | Discharge: 2013-05-15 | Disposition: A | Discharge status: Home / Self Care | Payer: Medicare PPO | Source: Ambulatory Visit | Attending: Family Medicine | Admitting: Family Medicine

## 2013-05-15 DIAGNOSIS — N63 Unspecified lump in unspecified breast: Secondary | ICD-10-CM

## 2013-05-15 DIAGNOSIS — C50919 Malignant neoplasm of unspecified site of unspecified female breast: Secondary | ICD-10-CM

## 2013-05-15 NOTE — Telephone Encounter (Signed)
New referral placed due to [facility].

## 2013-05-16 ENCOUNTER — Telehealth: Payer: Self-pay | Admitting: *Deleted

## 2013-05-16 DIAGNOSIS — Z170 Estrogen receptor positive status [ER+]: Secondary | ICD-10-CM | POA: Insufficient documentation

## 2013-05-16 DIAGNOSIS — C50411 Malignant neoplasm of upper-outer quadrant of right female breast: Secondary | ICD-10-CM

## 2013-05-16 NOTE — Telephone Encounter (Signed)
Confirmed [facility] for 05/22/13 at 1200 .  Instructions and contact information given. 

## 2013-05-20 ENCOUNTER — Ambulatory Visit
Admission: RE | Admit: 2013-05-20 | Discharge: 2013-05-20 | Disposition: A | Discharge status: Home / Self Care | Payer: Medicare PPO | Source: Ambulatory Visit | Attending: Family Medicine | Admitting: Family Medicine

## 2013-05-20 DIAGNOSIS — C50919 Malignant neoplasm of unspecified site of unspecified female breast: Secondary | ICD-10-CM

## 2013-05-20 MED ORDER — GADOBENATE DIMEGLUMINE 529 MG/ML IV SOLN
15.0000 mL | Freq: Once | INTRAVENOUS | Status: AC | PRN
  Administered 2013-05-20: 15 mL via INTRAVENOUS

## 2013-05-22 ENCOUNTER — Encounter: Payer: Self-pay | Admitting: Oncology

## 2013-05-22 ENCOUNTER — Ambulatory Visit
Admission: RE | Admit: 2013-05-22 | Discharge: 2013-05-22 | Disposition: A | Discharge status: Home / Self Care | Payer: Medicare PPO | Source: Ambulatory Visit | Attending: Radiation Oncology | Admitting: Radiation Oncology

## 2013-05-22 ENCOUNTER — Encounter (INDEPENDENT_AMBULATORY_CARE_PROVIDER_SITE_OTHER): Payer: Self-pay | Admitting: General Surgery

## 2013-05-22 ENCOUNTER — Ambulatory Visit (HOSPITAL_BASED_OUTPATIENT_CLINIC_OR_DEPARTMENT_OTHER): Payer: Medicare PPO

## 2013-05-22 ENCOUNTER — Ambulatory Visit (HOSPITAL_BASED_OUTPATIENT_CLINIC_OR_DEPARTMENT_OTHER): Payer: Medicare PPO | Admitting: General Surgery

## 2013-05-22 ENCOUNTER — Telehealth: Payer: Self-pay | Admitting: *Deleted

## 2013-05-22 ENCOUNTER — Other Ambulatory Visit (HOSPITAL_BASED_OUTPATIENT_CLINIC_OR_DEPARTMENT_OTHER): Payer: Medicare PPO

## 2013-05-22 ENCOUNTER — Ambulatory Visit: Payer: Medicare PPO | Attending: General Surgery | Admitting: Physical Therapy

## 2013-05-22 ENCOUNTER — Ambulatory Visit (HOSPITAL_BASED_OUTPATIENT_CLINIC_OR_DEPARTMENT_OTHER): Payer: Medicare PPO | Admitting: Oncology

## 2013-05-22 VITALS — BP 158/88 | HR 76 | Temp 98.5°F | Resp 18 | Ht 64.25 in | Wt 167.5 lb

## 2013-05-22 DIAGNOSIS — Z170 Estrogen receptor positive status [ER+]: Secondary | ICD-10-CM

## 2013-05-22 DIAGNOSIS — G4733 Obstructive sleep apnea (adult) (pediatric): Secondary | ICD-10-CM

## 2013-05-22 DIAGNOSIS — G4730 Sleep apnea, unspecified: Secondary | ICD-10-CM | POA: Insufficient documentation

## 2013-05-22 DIAGNOSIS — C50411 Malignant neoplasm of upper-outer quadrant of right female breast: Secondary | ICD-10-CM

## 2013-05-22 DIAGNOSIS — M810 Age-related osteoporosis without current pathological fracture: Secondary | ICD-10-CM

## 2013-05-22 DIAGNOSIS — IMO0001 Reserved for inherently not codable concepts without codable children: Secondary | ICD-10-CM | POA: Insufficient documentation

## 2013-05-22 DIAGNOSIS — E785 Hyperlipidemia, unspecified: Secondary | ICD-10-CM

## 2013-05-22 DIAGNOSIS — Z853 Personal history of malignant neoplasm of breast: Secondary | ICD-10-CM | POA: Insufficient documentation

## 2013-05-22 DIAGNOSIS — C50419 Malignant neoplasm of upper-outer quadrant of unspecified female breast: Secondary | ICD-10-CM

## 2013-05-22 DIAGNOSIS — R293 Abnormal posture: Secondary | ICD-10-CM | POA: Insufficient documentation

## 2013-05-22 DIAGNOSIS — M549 Dorsalgia, unspecified: Secondary | ICD-10-CM

## 2013-05-22 DIAGNOSIS — M949 Disorder of cartilage, unspecified: Secondary | ICD-10-CM

## 2013-05-22 DIAGNOSIS — G8929 Other chronic pain: Secondary | ICD-10-CM

## 2013-05-22 DIAGNOSIS — M899 Disorder of bone, unspecified: Secondary | ICD-10-CM

## 2013-05-22 LAB — CBC WITH DIFFERENTIAL/PLATELET
BASO%: 0.4 % (ref 0.0–2.0)
Basophils Absolute: 0 10*3/uL (ref 0.0–0.1)
EOS%: 1.1 % (ref 0.0–7.0)
Eosinophils Absolute: 0.1 10*3/uL (ref 0.0–0.5)
HCT: 34.5 % — ABNORMAL LOW (ref 34.8–46.6)
HGB: 11.4 g/dL — ABNORMAL LOW (ref 11.6–15.9)
LYMPH%: 45.4 % (ref 14.0–49.7)
MCH: 30.9 pg (ref 25.1–34.0)
MCHC: 33 g/dL (ref 31.5–36.0)
MCV: 93.5 fL (ref 79.5–101.0)
MONO#: 0.5 10*3/uL (ref 0.1–0.9)
MONO%: 10.9 % (ref 0.0–14.0)
NEUT%: 42.2 % (ref 38.4–76.8)
NEUTROS ABS: 2 10*3/uL (ref 1.5–6.5)
PLATELETS: 227 10*3/uL (ref 145–400)
RBC: 3.69 10*6/uL — AB (ref 3.70–5.45)
RDW: 14.5 % (ref 11.2–14.5)
WBC: 4.7 10*3/uL (ref 3.9–10.3)
lymph#: 2.1 10*3/uL (ref 0.9–3.3)

## 2013-05-22 LAB — COMPREHENSIVE METABOLIC PANEL (CC13)
ALT: 13 U/L (ref 0–55)
AST: 17 U/L (ref 5–34)
Albumin: 3.9 g/dL (ref 3.5–5.0)
Alkaline Phosphatase: 42 U/L (ref 40–150)
Anion Gap: 7 mEq/L (ref 3–11)
BUN: 10.8 mg/dL (ref 7.0–26.0)
CO2: 26 mEq/L (ref 22–29)
Calcium: 9.6 mg/dL (ref 8.4–10.4)
Chloride: 108 mEq/L (ref 98–109)
Creatinine: 1.1 mg/dL (ref 0.6–1.1)
Glucose: 106 mg/dl (ref 70–140)
Potassium: 4 mEq/L (ref 3.5–5.1)
Sodium: 141 mEq/L (ref 136–145)
Total Bilirubin: 0.39 mg/dL (ref 0.20–1.20)
Total Protein: 7.1 g/dL (ref 6.4–8.3)

## 2013-05-22 NOTE — Patient Instructions (Signed)
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  We have discussed all of your surgical options.  You will be scheduled for right partial mastectomy and right axillary sentinel lymph node biopsy.  Decisions about other forms of treatment will be made after surgery is completed.     Lumpectomy A lumpectomy is a form of "breast conserving" or "breast preservation" surgery. It may also be referred to as a partial mastectomy. During a lumpectomy, the portion of the breast that contains the cancerous tumor or breast mass (the lump) is removed. Some normal tissue around the lump may also be removed to make sure all the tumor has been removed. This surgery should take 40 minutes or less. LET [facility] CARE PROVIDER KNOW ABOUT:  Any allergies you have.  All medicines you are taking, including vitamins, herbs, eye drops, creams, and over-the-counter medicines.  Previous problems you or members of your family have had with the use of anesthetics.  Any blood disorders you have.  Previous surgeries you have had.  Medical conditions you have. RISKS AND COMPLICATIONS Generally, this is a safe procedure. However, as with any procedure, complications can occur. Possible complications include:  Bleeding.  Infection.  Pain.  Temporary swelling.  Change in the shape of the breast, particularly if a large portion is removed. BEFORE THE PROCEDURE  Ask your health care provider about changing or stopping your regular medicines.  Do not eat or drink anything for 7 8 hours before the surgery or as directed by your health care provider. Ask your health care provider if you can take a sip of water with any approved medicines.  On the day of surgery, your healthcare provider will use a mammogram or ultrasound to locate and mark the tumor in your breast. These markings on your breast will show where the cut (incision) will be made. PROCEDURE   An IV tube will be put  into one of your veins.  You may be given medicine to help you relax before the surgery (sedative). You will be given one of the following:  A medicine that numbs the area (local anesthesia).  A medicine that makes you go to sleep (general anesthesia).  Your health care provider will use a kind of electric scalpel that uses heat to minimize bleeding (electrocautery knife).  A curved incision (like a smile or frown) that follows the natural curve of your breast is made, to allow for minimal scarring and better healing.  The tumor will be removed with some of the surrounding tissue. This will be sent to the lab for analysis. Your health care provider may also remove your lymph nodes at this time if needed.  Sometimes, but not always, a rubber tube called a drain will be surgically inserted into your breast area or armpit to collect excess fluid that may accumulate in the space where the tumor was. This drain is connected to a plastic bulb on the outside of your body. This drain creates suction to help remove the fluid.  The incisions will be closed with stitches (sutures).  A bandage may be placed over the incisions. AFTER THE PROCEDURE  You will be taken to the recovery area.  You will be given medicine for pain.  A small rubber drain may be placed in the breast for 2 3 days to prevent a collection of blood (hematoma) from developing in the breast. You will be given instructions on caring for the drain before you go home.  A pressure bandage (dressing) will be applied for 1 2 days to prevent bleeding. Ask your health care provider how to care for your bandage at home. Document Released: 05/23/2006 Document Revised: 12/12/2012 Document Reviewed: 09/14/2012 [facility] Patient Information 2014 [place].

## 2013-05-22 NOTE — Telephone Encounter (Signed)
appts made and printed. Pt is aware that cs will call for her PET appt...td

## 2013-05-22 NOTE — Progress Notes (Signed)
Patient ID: Monica Diaz, female   DOB: 1941-01-25, 73 y.o.   MRN: [id number]  No chief complaint on file.   HPI Monica Diaz is a 73 y.o. female.  She is referred by Dr. Ulyess Blossom for evaluation and management of an invasive ductal carcinoma of the right breast, upper outer quadrant. Dr. Birdie Riddle is her PCP. She is being evaluated in the be in the [facility]  today by Dr. Jana Hakim, Dr. Lisbeth Renshaw, and me.   She has no significant prior breast problems. She was told that she had fibroadenomas in college. She had open right breast biopsy in 2005 for [facility] for benign disease.  Recent screening mammograms and ultrasound show a 14 mm lobulated density in the right breast at the 10:00 position, 5 cm from the nipple. 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 ER 99%, PR 50%, T6-T7 91%, HER-2-negative. Subsequent MRI shows a 2.3 cm enhancing mass in the upper outer right breast. No significant hematoma. The rest of the breast and lymph nodes looked normal. As a solitary finding. Also noted was a 6 mm enhancing sternal lesion. Dr. Jana Hakim is considering whether to do MRI or PET scan.  Family history is negative for breast or ovarian cancer.  Comorbidities include obstructive sleep apnea with CPAP at night, hyperlipidemia, TAH and LSO in the past. HPI  Past Medical History  Diagnosis Date  . Hyperlipidemia   . Osteoporosis   . Thyroid disease   . Sleep apnea     Past Surgical History  Procedure Laterality Date  . Abdominal hysterectomy      fibroids  . Left oophorectomy      Family History  Problem Relation Age of Onset  . Hypertension Mother   . Diabetes Maternal Aunt   . Stroke      grandfather    Social History History  Substance Use Topics  . Smoking status: Never Smoker   . Smokeless tobacco: Not on file  . Alcohol Use: Yes     Comment: occasionally    Allergies  Allergen Reactions  . Codeine     REACTION:  sick to stomach    Current Outpatient Prescriptions  Medication Sig Dispense Refill  . alendronate (FOSAMAX) 70 MG tablet Take 1 tablet (70 mg total) by mouth every 7 (seven) days. Take with a full glass of water on an empty stomach.  30 tablet  1  . calcium citrate-vitamin D (CITRACAL+D) 315-200 MG-UNIT per tablet Take 1 tablet by mouth 2 (two) times daily.      . Coenzyme Q10 (COQ10) 200 MG CAPS Take 1 capsule by mouth at bedtime.      . CRESTOR 10 MG tablet TAKE 1 TABLET BY MOUTH DAILY  30 tablet  3  . loratadine (CLARITIN) 10 MG tablet Take 10 mg by mouth daily as needed.       . mometasone (NASONEX) 50 MCG/ACT nasal spray Place 2 sprays into the nose daily as needed.      . Multiple Vitamins-Minerals (CENTRUM SILVER PO) Take 1 tablet by mouth.      Marland Kitchen omeprazole (PRILOSEC) 10 MG capsule Take 10 mg by mouth.      Marland Kitchen OVER THE COUNTER MEDICATION Mega Red Capsules 1-2 per month      . OVER THE COUNTER MEDICATION Take 2 capsules by mouth daily. Collagen Renew Supplement      . thyroid (ARMOUR) 30 MG tablet Take 1 tablet (30 mg total)  by mouth daily.  30 tablet  11  . vitamin B-12 (CYANOCOBALAMIN) 50 MCG tablet Take 50 mcg by mouth.       No current facility-administered medications for this visit.    Review of Systems Review of Systems  Constitutional: Negative for fever, chills and unexpected weight change.  HENT: Negative for congestion, hearing loss, sore throat, trouble swallowing and voice change.   Eyes: Negative for visual disturbance.  Respiratory: Positive for apnea. Negative for cough and wheezing.   Cardiovascular: Negative for chest pain, palpitations and leg swelling.  Gastrointestinal: Negative for nausea, vomiting, abdominal pain, diarrhea, constipation, blood in stool, abdominal distention and anal bleeding.  Genitourinary: Negative for hematuria, vaginal bleeding and difficulty urinating.  Musculoskeletal: Negative for arthralgias.  Skin: Negative for rash and wound.    Neurological: Negative for seizures, syncope and headaches.  Hematological: Negative for adenopathy. Does not bruise/bleed easily.  Psychiatric/Behavioral: Negative for confusion.    There were no vitals taken for this visit.  Physical Exam Physical Exam  Constitutional: She is oriented to person, place, and time. She appears well-developed and well-nourished. No distress.  HENT:  Head: Normocephalic and atraumatic.  Nose: Nose normal.  Mouth/Throat: No oropharyngeal exudate.  Eyes: Conjunctivae and EOM are normal. Pupils are equal, round, and reactive to light. Left eye exhibits no discharge. No scleral icterus.  Neck: Neck supple. No JVD present. No tracheal deviation present. No thyromegaly present.  Cardiovascular: Normal rate, regular rhythm, normal heart sounds and intact distal pulses.   No murmur heard. Pulmonary/Chest: Effort normal and breath sounds normal. No respiratory distress. She has no wheezes. She has no rales. She exhibits no tenderness.    Bruise and a vague 1.5 cm palpable mass, 10:00 position right breast, just outside the areolar margin. Well-healed scar upper outer quadrant right breast.  Abdominal: Soft. Bowel sounds are normal. She exhibits no distension and no mass. There is no tenderness. There is no rebound and no guarding.  Well healed Pfannenstiel scar.  Musculoskeletal: She exhibits no edema and no tenderness.  Lymphadenopathy:    She has no cervical adenopathy.  Neurological: She is alert and oriented to person, place, and time. She exhibits normal muscle tone. Coordination normal.  Skin: Skin is warm. No rash noted. She is not diaphoretic. No erythema. No pallor.  Psychiatric: She has a normal mood and affect. Her behavior is normal. Judgment and thought content normal.    Data Reviewed All imaging studies and pathology. Case discussed in conference this morning.treatment plan coordinated with Dr. Jana Hakim and Dr. Lisbeth Renshaw.  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 Clinical stage T2, N0   Obstructive sleep apnea with CPAP use at night  Hyperlipidemia  Status post TAH and LSO.  Question of 6 mm sternal nodule, enhancing on MRI.    Plan    I had a long discussion with the patient and her family regarding her breast cancer diagnosis, its implications and natural history, all treatment options with particular attention to surgical options. At the end of the discussion she is strongly motivated for breast conservation, and I think she is a good candidate for that.  She is aware of the small sternal nodule. She is aware that Dr. Jana Hakim will further work that up  She will be scheduled for right partial mastectomy with needle localization and right axillary sentinel lymph node biopsy.  She is aware  that she may or may not require chemotherapy, but no decision has been made this time  I have discussed indications, details, techniques, and numerous risks of the surgery with her and her family. She is aware of the risk of bleeding, infection, reoperation for positive margins, cosmetic deformity, arm swelling and numbness, and other unforseen problems. She understands all these issues. All of her questions are answered. She agrees with this plan.        Edsel Petrin. Dalbert Batman, M.D., [facility] General and Minimally invasive Surgery Breast and Colorectal Surgery Office:   [phone] Pager:   [phone]  05/22/2013, 4:37 PM

## 2013-05-22 NOTE — Progress Notes (Signed)
Patient has [facility] packet. No financial issues for new patient checked in. She has appt card.

## 2013-05-23 ENCOUNTER — Encounter: Payer: Self-pay | Admitting: *Deleted

## 2013-05-23 ENCOUNTER — Encounter (HOSPITAL_BASED_OUTPATIENT_CLINIC_OR_DEPARTMENT_OTHER): Payer: Self-pay | Admitting: *Deleted

## 2013-05-23 ENCOUNTER — Other Ambulatory Visit: Payer: Self-pay | Admitting: Oncology

## 2013-05-23 NOTE — Progress Notes (Signed)
Monica Diaz  [phone] Fax:([phone]     ID: Monica Diaz OB: 03/18/1941  MR#: [id number]  YPP#:[id number]  PCP: Annye Asa, MD GYN:   SU: Monica Diaz OTHER MD: Jaquita Folds Dohmier, Monica Diaz  CHIEF COMPLAINT: "What I have to remove?"   HISTORY OF PRESENT ILLNESS: Monica Diaz had bilateral screening mammography at the breast center 04/15/2013. This showed a possible mass in the right breast. Diagnostic right mammography and ultrasonography 05/09/2013 showed an irregular area of focal asymmetry in the right breast, which was not palpable. Ultrasound showed dystrophic calcification close to the area of asymmetry. The area of abnormality measured approximately 1.4 cm.  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 HER-2 was reported as negative during the multidisciplinary breast cancer conference 05/22/2013. The written report is pending.   On 05/20/2013 the patient underwent bilateral breast MRI which showed a 2.3 cm irregular enhancing mass in the right breast at the 12:00 position. There were no other areas of concern in either breast and there were no abnormal appearing lymph nodes. However incidentally a 6 mm enhancing right paramedian sternal lesion was noted.  The patient's subsequent history is as detailed below   INTERVAL HISTORY: Monica Diaz was evaluated in the multidisciplinary breast cancer clinic 05/22/2013, accompanied by her daughter Monica Diaz and her niece Monica Diaz.Monica Diaz  REVIEW OF SYSTEMS: There were no specific symptoms leading to the original mammogram, which was routinely scheduled. The patient denies unusual headaches, visual changes, nausea, vomiting, stiff neck, dizziness, or  gait imbalance. There has been no cough, phlegm production, or pleurisy, no chest pain or pressure, and no change in bowel or bladder habits. The patient denies fever, rash, bleeding, unexplained fatigue or unexplained weight loss. The patient is having mild seasonal allergies, and has chronic low back pain which is not more intense or persistent than before. A detailed review of systems was otherwise entirely negative today.  PAST MEDICAL HISTORY: Past Medical History  Diagnosis Date  . Hyperlipidemia   . Osteoporosis   . Thyroid disease   . Wears glasses   . HOH (hard of hearing)   . Sleep apnea     uses a cpap  . Arthritis   . Cancer     breast  . Seasonal allergies     PAST SURGICAL HISTORY: Past Surgical History  Procedure Laterality Date  . Abdominal hysterectomy      fibroids  . Left oophorectomy    . Colonoscopy      FAMILY HISTORY Family History  Problem Relation Age of Onset  . Hypertension Mother   . Diabetes Maternal Aunt   . Stroke      grandfather  The patient knows little about her father. Her mother is living, 16 years old as of January 2015. The patient was an only child. There is no history of breast or ovarian cancer in the family  GYNECOLOGIC HISTORY:  Menarche age 23, first live birth age 4, the patient is Monica Diaz P2. 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 She is divorced and lives alone with Monica Diaz, who are two nonverbal parakeets. Her daughter Ronne Diaz is  a housewife in [place]. Her daughter Monica Diaz is a college professor in [employer], teaching urban education. The patient's niece [name] MD is a neurologist working at [place]: Not in place. The patient was given the appropriate documents at  the time of her first visit here 05/22/2013 to complete and notarize at her discretion. She intends to name her daughter [name] as her healthcare power of attorney. She can be reached at [phone]   HEALTH MAINTENANCE: History  Substance Use Topics  . Smoking status: Never Smoker   . Smokeless tobacco: Not on file  . Alcohol Use: Yes     Comment: occasionally     Colonoscopy:  PAP:  Bone density:04/20/2012 showed mild osteopenia with a T score of -1.3  Lipid panel:  Allergies  Allergen Reactions  . Codeine     REACTION: sick to stomach    Current Outpatient Prescriptions  Medication Sig Dispense Refill  . alendronate (FOSAMAX) 70 MG tablet Take 1 tablet (70 mg total) by mouth every 7 (seven) days. Take with a full glass of water on an empty stomach.  30 tablet  1  . calcium citrate-vitamin D (CITRACAL+D) 315-200 MG-UNIT per tablet Take 1 tablet by mouth 2 (two) times daily.      . Coenzyme Q10 (COQ10) 200 MG CAPS Take 1 capsule by mouth at bedtime.      . CRESTOR 10 MG tablet TAKE 1 TABLET BY MOUTH DAILY  30 tablet  3  . Multiple Vitamins-Minerals (CENTRUM SILVER PO) Take 1 tablet by mouth.      Marland Kitchen OVER THE COUNTER MEDICATION Mega Red Capsules 1-2 per month      . OVER THE COUNTER MEDICATION Take 2 capsules by mouth daily. Collagen Renew Supplement      . thyroid (ARMOUR) 30 MG tablet Take 1 tablet (30 mg total) by mouth daily.  30 tablet  11  . loratadine (CLARITIN) 10 MG tablet Take 10 mg by mouth daily as needed.       . mometasone (NASONEX) 50 MCG/ACT nasal spray Place 2 sprays into the nose daily as needed.      Marland Kitchen omeprazole (PRILOSEC) 10 MG capsule Take 10 mg by mouth.      . vitamin B-12 (CYANOCOBALAMIN) 50 MCG tablet Take 50 mcg by mouth.       No current facility-administered medications for this visit.    OBJECTIVE: Middle-aged [place] American woman in no acute distress  Filed Vitals:   05/22/13 1251  BP: 158/88  Pulse: 76  Temp: 98.5 F (36.9 C)    Resp: 18     Body mass index is 28.53 kg/(m^2).    ECOG FS:0 - Asymptomatic  Ocular: Sclerae unicteric, pupils equal, round and reactive to light Ear-nose-throat: Oropharynx clear, dentition  in good repair  Lymphatic: No cervical or supraclavicular adenopathy Lungs no rales or rhonchi, good excursion bilaterally Heart regular rate and rhythm, no murmur appreciated Abd soft, nontender, positive bowel sounds MSK no focal spinal tenderness, no joint edema, no palpable sternal mass and no sternal tenderness  Neuro: non-focal, well-oriented, appropriate affect Breasts: The right breast is status post recent biopsy. There is a mild ecchymosis. There are no skin or nipple changes of concern. The right axilla is benign. The left breast is unremarkable.   LAB RESULTS:  CMP     Component Value Date/Time   NA 141 05/22/2013 1227   NA 137 05/15/2012 1100  K 4.0 05/22/2013 1227   K 3.7 05/15/2012 1100   CL 105 05/15/2012 1100   CO2 26 05/22/2013 1227   CO2 26 05/15/2012 1100   GLUCOSE 106 05/22/2013 1227   GLUCOSE 81 05/15/2012 1100   BUN 10.8 05/22/2013 1227   BUN 12 05/15/2012 1100   CREATININE 1.1 05/22/2013 1227   CREATININE 1.0 05/15/2012 1100   CALCIUM 9.6 05/22/2013 1227   CALCIUM 9.1 05/15/2012 1100   PROT 7.1 05/22/2013 1227   PROT 7.6 05/15/2012 1100   ALBUMIN 3.9 05/22/2013 1227   ALBUMIN 4.1 05/15/2012 1100   AST 17 05/22/2013 1227   AST 25 05/15/2012 1100   ALT 13 05/22/2013 1227   ALT 21 05/15/2012 1100   ALKPHOS 42 05/22/2013 1227   ALKPHOS 36* 05/15/2012 1100   BILITOT 0.39 05/22/2013 1227   BILITOT 0.4 05/15/2012 1100   GFRNONAA 66.33 03/25/2010 0828    I No results found for this basename: SPEP, UPEP,  kappa and lambda light chains    Lab Results  Component Value Date   WBC 4.7 05/22/2013   NEUTROABS 2.0 05/22/2013   HGB 11.4* 05/22/2013   HCT 34.5* 05/22/2013   MCV 93.5 05/22/2013   PLT 227 05/22/2013      Chemistry      Component Value Date/Time   NA 141 05/22/2013 1227    NA 137 05/15/2012 1100   K 4.0 05/22/2013 1227   K 3.7 05/15/2012 1100   CL 105 05/15/2012 1100   CO2 26 05/22/2013 1227   CO2 26 05/15/2012 1100   BUN 10.8 05/22/2013 1227   BUN 12 05/15/2012 1100   CREATININE 1.1 05/22/2013 1227   CREATININE 1.0 05/15/2012 1100      Component Value Date/Time   CALCIUM 9.6 05/22/2013 1227   CALCIUM 9.1 05/15/2012 1100   ALKPHOS 42 05/22/2013 1227   ALKPHOS 36* 05/15/2012 1100   AST 17 05/22/2013 1227   AST 25 05/15/2012 1100   ALT 13 05/22/2013 1227   ALT 21 05/15/2012 1100   BILITOT 0.39 05/22/2013 1227   BILITOT 0.4 05/15/2012 1100       No results found for this basename: LABCA2    No components found with this basename: LABCA125    No results found for this basename: INR,  in the last 168 hours  Urinalysis No results found for this basename: colorurine, appearanceur, labspec, phurine, glucoseu, hgbur, bilirubinur, ketonesur, proteinur, urobilinogen, nitrite, leukocytesur    STUDIES: Mr Breast Bilateral W Wo Contrast  05/20/2013   CLINICAL DATA:  73 year old female with 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 Preoperative treatment planning. History of benign excisional biopsy of the right breast in 2005.  EXAM: BILATERAL BREAST MRI WITH AND WITHOUT CONTRAST  LABS:  BUN and creatinine were obtained on site at [facility] at [address]  Results:  BUN 10 mg/dL,  Creatinine 1.0 mg/dL.  TECHNIQUE: Multiplanar, multisequence MR images of both breasts were obtained prior to and following the intravenous administration of 50m of MultiHance.  THREE-DIMENSIONAL MR IMAGE RENDERING ON INDEPENDENT WORKSTATION:  Three-dimensional MR images were rendered by post-processing of the original MR data on an independent workstation. The three-dimensional MR images were interpreted, and findings are reported in the following complete MRI report for this study. Three dimensional images were evaluated at the independent DynaCad workstation   COMPARISON:  Recent mammograms and ultrasounds from the breast center.  FINDINGS: Breast composition: c:  Heterogeneous fibroglandular tissue  Background parenchymal enhancement:  Moderate  Right breast: A 2.2 x 2.3 x 1.7 cm irregular enhancing mass with rapid washout kinetics and interna biopsy clip artifact is identified at the 12 o'clock position of the right breast (middle third).  No other suspicious areas of enhancement are identified within the right breast. Compatible with biopsy-proven neoplasm.  Left breast: No mass or abnormal enhancement.  Lymph nodes: No abnormal appearing lymph nodes.  Ancillary findings: A 6 mm enhancing right paramedian sternal lesion is present (image #101 axial first pass postcontrast sequence).  IMPRESSION: 2.2 x 2.3 x 1.7 cm biopsy-proven neoplasm within the upper right breast. No evidence of multifocal, multicentric or contralateral disease. No abnormal lymph nodes identified.  Indeterminate 6 mm enhancing sternal lesion. Metastatic disease is not entirely excluded.  RECOMMENDATION: Treatment plan.  Recommend comparison to any outside cross-sectional imaging that includes the sternum to assess stability of the sternal lesion. Bone scan or PET-CT may or may not be helpful for further evaluation.  BI-RADS CATEGORY  6: Known biopsy-proven malignancy - appropriate action should be taken.   Electronically Signed   By: Hassan Rowan M.D.   On: 05/20/2013 14:53   Mm Digital Diagnostic Unilat R  05/14/2013   CLINICAL DATA:  Ultrasound-guided core needle biopsy of a mass at 10 o'clock 5 cm from the right nipple with clip placement.  EXAM: POST-BIOPSY CLIP PLACEMENT RIGHT DIAGNOSTIC MAMMOGRAM  COMPARISON:  Previous exams.  FINDINGS: Films are performed following ultrasound guided biopsy of a mass at 10 o'clock 5 cm from the right nipple. The ribbon clip is appropriately positioned within the mass.  IMPRESSION: Appropriate clip placement following ultrasound-guided core needle biopsy of a  mass at 10 o'clock 5 cm from the right nipple.  Final Assessment: Post Procedure Mammograms for Marker Placement   Electronically Signed   By: Ulyess Blossom M.D.   On: 05/14/2013 08:55   Mm Digital Diagnostic Unilat R  05/09/2013   ADDENDUM REPORT: 05/09/2013 14:36  ADDENDUM: I have reviewed this patient's imaging, which has discrepant findings. The most concerning findings are what appears to be a progression of focal density in the right breast in the region of the previous biopsy. This also appeared concerning on spot compression imaging. For this reason, the patient was asked to return for a repeat right breast ultrasound.  Repeat right breast ultrasound, somewhat ill-defined hypoechoic mildly shadowing lobulated area was noted in the 10 o'clock position 5 cm from the nipple, more discretely seen on the antiradial than on the radial view. Measures approximately 14 mm in greatest dimension. There was no other suspicious finding. Although this finding may reflect the mammographic abnormality, this is not conclusive.  Overall, the constellation of findings in the right breast are suspicious enough to warrant biopsy, despite the history of a previous benign biopsy in this location. Since the mammographic and ultrasound abnormalities are not conclusively this same finding, stereotactic breast biopsy is recommended.  IMPRESSION:  REVISED :  SUSPICIOUS ABNORMALITY IN THE RIGHT BREAST.  THIS  COULD REFLECT AN INVASIVE LOBULAR CARCINOMA.: IMPRESSION: REVISED : SUSPICIOUS ABNORMALITY IN THE RIGHT BREAST. THIS COULD REFLECT AN INVASIVE LOBULAR CARCINOMA. RECOMMENDATION:  STEREOTACTIC BREAST BIOPSY.  Patient is scheduled for right breast stereotactic breast biopsy on January 20th at a.m.  CATEGORY : Revised: 4: Suspicious abnormality - biopsy should be considered.   Electronically Signed   By: Lajean Manes M.D.   On: 05/09/2013 14:36   05/09/2013   CLINICAL DATA:  Call back from screening for an asymmetry in  the  right breast. Patient has had a previous benign biopsy in the location of the asymmetry, performed in 2005.  EXAM: DIGITAL DIAGNOSTIC  RIGHT MAMMOGRAM  ULTRASOUND RIGHT BREAST  COMPARISON:  04/15/2013 and prior studies.  ACR Breast Density Category c: The breast tissue is heterogeneously dense, which may obscure small masses.  FINDINGS: On spot compression imaging, the area of focal asymmetry persists, but has irregular, somewhat ill-defined margins. There is associated mild architectural distortion.  On physical exam, there is a scar for 4-5 cm above the nipple. No discrete mass is palpated.  Ultrasound is performed, showing a dense calcification reflecting a benign dystrophic calcification, which mammographically lies in close proximity to the area of asymmetry and architectural distortion. No other sonographic abnormality is noted in the upper left breast. No sonographic abnormality is seen to correspond to the mammographic abnormality.  IMPRESSION: No evidence of malignancy. The area of asymmetry and architectural distortion is presumed to be from the prior excisional biopsy. On review of the tomosynthesis performed on the current screening study, this does not appear to be mass, but an area asymmetric density and architectural distortion consistent with scarring.  RECOMMENDATION: Screening mammogram in one year.(Code:SM-B-01Y)  I have discussed the findings and recommendations with the patient. Results were also provided in writing at the conclusion of the visit. If applicable, a reminder letter will be sent to the patient regarding the next appointment.  BI-RADS CATEGORY  2: Benign Finding(s)  Electronically Signed: By: Lajean Manes M.D. On: 05/06/2013 16:41   Mm Radiologist Eval And Mgmt  05/15/2013   CONSULTATION: HPI: The patient returns for a followup after an ultrasound-guided biopsy of a suspicious mass in the right breast at 10 o'clock. The patient is doing well and denies any biopsy site  complications.  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 This is concordant with the imaging findings.  Biopsy site: Clean, dry, and intact. Steri-Strips and two Band-Aids overlie small skin incision.  PLAN: All the patient's questions were answered. She is aware of the results and demonstrates understanding. The patient is scheduled to follow-up in Multidisciplinary Clinic 05/22/2013 and a breast MRI is scheduled on 05/20/2013 at 10:15 a.m.   Electronically Signed   By: Everlean Alstrom M.D.   On: 05/15/2013 15:28   US Breast Ltd Uni Right Inc Axilla  05/09/2013   ADDENDUM REPORT: 05/09/2013 14:36  ADDENDUM: I have reviewed this patient's imaging, which has discrepant findings. The most concerning findings are what appears to be a progression of focal density in the right breast in the region of the previous biopsy. This also appeared concerning on spot compression imaging. For this reason, the patient was asked to return for a repeat right breast ultrasound.  Repeat right breast ultrasound, somewhat ill-defined hypoechoic mildly shadowing lobulated area was noted in the 10 o'clock position 5 cm from the nipple, more discretely seen on the antiradial than on the radial view. Measures approximately 14 mm in greatest dimension. There was no other suspicious finding. Although this finding may reflect the mammographic abnormality, this is not conclusive.  Overall, the constellation of findings in the right breast are suspicious enough to warrant biopsy, despite the history of a previous benign biopsy in this location. Since the mammographic and ultrasound abnormalities are not conclusively this same finding, stereotactic breast biopsy is recommended.  IMPRESSION:  REVISED :  SUSPICIOUS ABNORMALITY IN THE RIGHT BREAST.  THIS  COULD REFLECT AN INVASIVE LOBULAR CARCINOMA.: IMPRESSION: REVISED :  SUSPICIOUS ABNORMALITY IN THE RIGHT BREAST. THIS COULD REFLECT AN INVASIVE  LOBULAR CARCINOMA. RECOMMENDATION:  STEREOTACTIC BREAST BIOPSY.  Patient is scheduled for right breast stereotactic breast biopsy on January 20th at a.m.  CATEGORY : Revised: 4: Suspicious abnormality - biopsy should be considered.   Electronically Signed   By: Lajean Manes M.D.   On: 05/09/2013 14:36   05/09/2013   CLINICAL DATA:  Call back from screening for an asymmetry in the right breast. Patient has had a previous benign biopsy in the location of the asymmetry, performed in 2005.  EXAM: DIGITAL DIAGNOSTIC  RIGHT MAMMOGRAM  ULTRASOUND RIGHT BREAST  COMPARISON:  04/15/2013 and prior studies.  ACR Breast Density Category c: The breast tissue is heterogeneously dense, which may obscure small masses.  FINDINGS: On spot compression imaging, the area of focal asymmetry persists, but has irregular, somewhat ill-defined margins. There is associated mild architectural distortion.  On physical exam, there is a scar for 4-5 cm above the nipple. No discrete mass is palpated.  Ultrasound is performed, showing a dense calcification reflecting a benign dystrophic calcification, which mammographically lies in close proximity to the area of asymmetry and architectural distortion. No other sonographic abnormality is noted in the upper left breast. No sonographic abnormality is seen to correspond to the mammographic abnormality.  IMPRESSION: No evidence of malignancy. The area of asymmetry and architectural distortion is presumed to be from the prior excisional biopsy. On review of the tomosynthesis performed on the current screening study, this does not appear to be mass, but an area asymmetric density and architectural distortion consistent with scarring.  RECOMMENDATION: Screening mammogram in one year.(Code:SM-B-01Y)  I have discussed the findings and recommendations with the patient. Results were also provided in writing at the conclusion of the visit. If applicable, a reminder letter will be sent to the patient regarding  the next appointment.  BI-RADS CATEGORY  2: Benign Finding(s)  Electronically Signed: By: Lajean Manes M.D. On: 05/06/2013 16:41   [place] Rt Breast Bx W Loc Dev 1st Lesion Img Bx Spec US Guide  05/14/2013   CLINICAL DATA:  1.5 cm irregular mass at 10 o'clock 5 cm from the right nipple.  EXAM: ULTRASOUND GUIDED RIGHT BREAST CORE NEEDLE BIOPSY WITH VACUUM ASSIST  COMPARISON:  Previous exams.  PROCEDURE: I met with the patient and we discussed the procedure of ultrasound-guided biopsy, including benefits and alternatives. We discussed the high likelihood of a successful procedure. We discussed the risks of the procedure including infection, bleeding, tissue injury, clip migration, and inadequate sampling. Informed written consent was given. The usual time-out protocol was performed immediately prior to the procedure.  Using sterile technique, 2% Lidocaine as local anesthetic, and direct ultrasound visualization, a 12 gauge vacuum-assisteddevice was used to perform biopsy of the mass at 10 o'clock 5 cm from the right nipple using a mediolateral approach. At the conclusion of the procedure, a ribbon tissue marker clip was deployed into the biopsy cavity. Follow-up 2-view mammogram was performed and dictated separately.  IMPRESSION: Ultrasound-guided biopsy of a mass at 10 o'clock 5 cm from the right nipple. No apparent complications.   Electronically Signed   By: Ulyess Blossom M.D.   On: 05/14/2013 09:13    ASSESSMENT: 73 y.o. [place] woman status post right breast biopsy 05/14/2013 for a clinical T2 N0, stage IIA invasive ductal carcinoma, grade 2 or 3, estrogen receptor 99% positive, progesterone receptor 50% positive, with an MIB-1 of 91%, and no HER-2 amplification  (1) 6 mm  enhancing right sternal lesion noted by breast MRI 05/20/2013  PLAN: We spent the better part of today's hour-long appointment discussing the biology of breast cancer in general, and the specifics of the patient's tumor in  particular. The patient understands that from the point of view of local treatment, mastectomy is the same as lumpectomy plus radiation. Nevertheless she is still considering the possibility of mastectomy to obviate the risk of local recurrence.  As far as systemic therapy is concerned, she will clearly benefit from antiestrogen. The question regarding chemotherapy is more complex. She is 73 years old, and in general there is less data available for older patients regarding chemotherapy. NCCN guidelines recommend an Oncotype for patients like her, and even though her MIB-1 is so elevated we are going to put that request through. I think that will give [place] additional information to help [place] make the chemotherapy decision.  Because of this uncertainty I am not requesting that the port be placed at the time of surgery.  As far as the sternal lesion is concerned, I am obtaining a PET scan to start with. If that is completely negative, I will obtain an MRI of the sternum and we will use that for followup. At this point I am not sure we will be able to safely and successfully biopsy that lesion.  The patient is a very good understanding of the overall plan. She agrees with it. She knows that the goal of treatment is cure. She will call with any problems that may develop before her next visit here.   Chauncey Cruel, MD   05/23/2013 11:51 AM

## 2013-05-23 NOTE — Progress Notes (Signed)
[place] Psychosocial Distress Screening Clinical Social Work  Interior and spatial designer met Patient at [facility] [employer].  The distress screening was discussed with Patient.  The patient scored a 2 on the Psychosocial Distress Thermometer which indicates mild distress. Clinical Social Worker Intern talked with Patient to assess for distress and other psychosocial needs. Patient indicated the distress was prior to receiving information about treatment.  Patient stated after meeting with Doctors she is understanding of her current condition and plan.  CSWI shared with Patient resources available.  Patient is aware of services and agrees to seek out further assistance if needed.   Clinical Social Worker follow up needed: no  If yes, follow up plan:   Wrenn Willcox S. [place] Work Intern [employer] [phone]

## 2013-05-23 NOTE — Progress Notes (Signed)
Radiation Oncology         [phone] ________________________________  Name: MIKAYAH JOY MRN: [id number]  Date: 05/22/2013  DOB: [birth year]  TG:GYIRSWNIO Birdie Riddle, MD  Adin Hector, MD   Lurline Del, MD  REFERRING PHYSICIAN: Adin Hector, MD   DIAGNOSIS: The encounter diagnosis was Breast cancer of upper-outer quadrant of right female breast.   HISTORY OF PRESENT ILLNESS::Jaynia Chauncey Cruel [place] is a 73 y.o. female who is seen for an initial consultation visit. The patient was noted to have a suspicious mass within the right breast on recent screening mammogram. This showed a 14 mm lobulated density at the 10:00 position within the right breast on ultrasound. 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 The invasive tumor was a grade 2/3 tumor. Receptor studies have indicated that the tumor is ER positive, PR positive, and HER-2/neu negative. The proliferative index was 91%.  The patient then proceeded to undergo an MRI scan of the breasts bilaterally. This showed a solitary enhancing mass within the upper outer quadrant of the right breast. This measured 2.3 cm. No other suspicious findings within the breast and no axillary adenopathy. 1 notable finding was a small subcentimeter lesion within the sternum of uncertain significance. This was discussed in multidisciplinary breast conference and further imaging workup was recommended.  The patient is seen today in multidisciplinary breast clinic.    PREVIOUS RADIATION THERAPY: No   PAST MEDICAL HISTORY:  has a past medical history of Hyperlipidemia; Osteoporosis; Thyroid disease; Wears glasses; HOH (hard of hearing); Sleep apnea; Arthritis; Cancer; and Seasonal allergies.     PAST SURGICAL HISTORY: Past Surgical History  Procedure Laterality Date  . Abdominal hysterectomy      fibroids  . Left oophorectomy    . Colonoscopy        FAMILY HISTORY: family history includes Diabetes in her maternal aunt; Hypertension in her mother; Stroke in an other family member.   SOCIAL HISTORY:  reports that she has never smoked. She does not have any smokeless tobacco history on file. She reports that she drinks alcohol. She reports that she does not use illicit drugs.   ALLERGIES: Codeine   MEDICATIONS:  Current Outpatient Prescriptions  Medication Sig Dispense Refill  . alendronate (FOSAMAX) 70 MG tablet Take 1 tablet (70 mg total) by mouth every 7 (seven) days. Take with a full glass of water on an empty stomach.  30 tablet  1  . calcium citrate-vitamin D (CITRACAL+D) 315-200 MG-UNIT per tablet Take 1 tablet by mouth 2 (two) times daily.      . Coenzyme Q10 (COQ10) 200 MG CAPS Take 1 capsule by mouth at bedtime.      . CRESTOR 10 MG tablet TAKE 1 TABLET BY MOUTH DAILY  30 tablet  3  . loratadine (CLARITIN) 10 MG tablet Take 10 mg by mouth daily as needed.       . mometasone (NASONEX) 50 MCG/ACT nasal spray Place 2 sprays into the nose daily as needed.      . Multiple Vitamins-Minerals (CENTRUM SILVER PO) Take 1 tablet by mouth.      Marland Kitchen omeprazole (PRILOSEC) 10 MG capsule Take 10 mg by mouth.      Marland Kitchen OVER THE COUNTER MEDICATION Mega Red Capsules 1-2 per month      . OVER THE COUNTER MEDICATION Take 2 capsules by mouth daily. Collagen Renew Supplement      . thyroid (ARMOUR) 30  MG tablet Take 1 tablet (30 mg total) by mouth daily.  30 tablet  11  . vitamin B-12 (CYANOCOBALAMIN) 50 MCG tablet Take 50 mcg by mouth.       No current facility-administered medications for this encounter.     REVIEW OF SYSTEMS:  A 15 point review of systems is documented in the electronic medical record. This was obtained by the nursing staff. However, I reviewed this with the patient to discuss relevant findings and make appropriate changes.  Pertinent items are noted in HPI.    PHYSICAL EXAM:  vitals were not taken for this visit.  ECOG =  0  0 - Asymptomatic (Fully active, able to carry on all predisease activities without restriction)  1 - Symptomatic but completely ambulatory (Restricted in physically strenuous activity but ambulatory and able to carry out work of a light or sedentary nature. For example, light housework, office work)  2 - Symptomatic, <50% in bed during the day (Ambulatory and capable of all self care but unable to carry out any work activities. Up and about more than 50% of waking hours)  3 - Symptomatic, >50% in bed, but not bedbound (Capable of only limited self-care, confined to bed or chair 50% or more of waking hours)  4 - Bedbound (Completely disabled. Cannot carry on any self-care. Totally confined to bed or chair)  5 - Death   Eustace Pen MM, Creech RH, Tormey DC, et al. [phone]). "Toxicity and response criteria of the [facility]". [place] Oncol. 5 (6): 649-55  General: Well-developed, in no acute distress HEENT: Normocephalic, atraumatic; oral cavity clear Neck: Supple without any lymphadenopathy Cardiovascular: Regular rate and rhythm Respiratory: Clear to auscultation bilaterally Breasts:  Some bruising present within the right breast with some underlying biopsy change associated with an underlying mass in the upper outer quadrant. A well-healed incision is present superiorly to this from a surgery from 2005 according to the patient. No axillary adenopathy on the right. No suspicious breast lesions and no axillary adenopathy on the left. GI: Soft, nontender, normal bowel sounds Extremities: No edema present Neuro: No focal deficits     LABORATORY DATA:  Lab Results  Component Value Date   WBC 4.7 05/22/2013   HGB 11.4* 05/22/2013   HCT 34.5* 05/22/2013   MCV 93.5 05/22/2013   PLT 227 05/22/2013   Lab Results  Component Value Date   NA 141 05/22/2013   K 4.0 05/22/2013   CL 105 05/15/2012   CO2 26 05/22/2013   Lab Results  Component Value Date   ALT 13 05/22/2013    AST 17 05/22/2013   ALKPHOS 42 05/22/2013   BILITOT 0.39 05/22/2013      RADIOGRAPHY: Mr Breast Bilateral W Wo Contrast  05/20/2013   CLINICAL DATA:  73 year old female with 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 Preoperative treatment planning. History of benign excisional biopsy of the right breast in 2005.  EXAM: BILATERAL BREAST MRI WITH AND WITHOUT CONTRAST  LABS:  BUN and creatinine were obtained on site at [facility] at [address]  Results:  BUN 10 mg/dL,  Creatinine 1.0 mg/dL.  TECHNIQUE: Multiplanar, multisequence MR images of both breasts were obtained prior to and following the intravenous administration of 82m of MultiHance.  THREE-DIMENSIONAL MR IMAGE RENDERING ON INDEPENDENT WORKSTATION:  Three-dimensional MR images were rendered by post-processing of the original MR data on an independent workstation. The three-dimensional MR images were interpreted, and findings are reported  in the following complete MRI report for this study. Three dimensional images were evaluated at the independent DynaCad workstation  COMPARISON:  Recent mammograms and ultrasounds from the breast center.  FINDINGS: Breast composition: c:  Heterogeneous fibroglandular tissue  Background parenchymal enhancement: Moderate  Right breast: A 2.2 x 2.3 x 1.7 cm irregular enhancing mass with rapid washout kinetics and interna biopsy clip artifact is identified at the 12 o'clock position of the right breast (middle third).  No other suspicious areas of enhancement are identified within the right breast. Compatible with biopsy-proven neoplasm.  Left breast: No mass or abnormal enhancement.  Lymph nodes: No abnormal appearing lymph nodes.  Ancillary findings: A 6 mm enhancing right paramedian sternal lesion is present (image #101 axial first pass postcontrast sequence).  IMPRESSION: 2.2 x 2.3 x 1.7 cm biopsy-proven neoplasm within the upper right breast. No evidence of multifocal,  multicentric or contralateral disease. No abnormal lymph nodes identified.  Indeterminate 6 mm enhancing sternal lesion. Metastatic disease is not entirely excluded.  RECOMMENDATION: Treatment plan.  Recommend comparison to any outside cross-sectional imaging that includes the sternum to assess stability of the sternal lesion. Bone scan or PET-CT may or may not be helpful for further evaluation.  BI-RADS CATEGORY  6: Known biopsy-proven malignancy - appropriate action should be taken.   Electronically Signed   By: Hassan Rowan M.D.   On: 05/20/2013 14:53   Mm Digital Diagnostic Unilat R  05/14/2013   CLINICAL DATA:  Ultrasound-guided core needle biopsy of a mass at 10 o'clock 5 cm from the right nipple with clip placement.  EXAM: POST-BIOPSY CLIP PLACEMENT RIGHT DIAGNOSTIC MAMMOGRAM  COMPARISON:  Previous exams.  FINDINGS: Films are performed following ultrasound guided biopsy of a mass at 10 o'clock 5 cm from the right nipple. The ribbon clip is appropriately positioned within the mass.  IMPRESSION: Appropriate clip placement following ultrasound-guided core needle biopsy of a mass at 10 o'clock 5 cm from the right nipple.  Final Assessment: Post Procedure Mammograms for Marker Placement   Electronically Signed   By: Ulyess Blossom M.D.   On: 05/14/2013 08:55   Mm Digital Diagnostic Unilat R  05/09/2013   ADDENDUM REPORT: 05/09/2013 14:36  ADDENDUM: I have reviewed this patient's imaging, which has discrepant findings. The most concerning findings are what appears to be a progression of focal density in the right breast in the region of the previous biopsy. This also appeared concerning on spot compression imaging. For this reason, the patient was asked to return for a repeat right breast ultrasound.  Repeat right breast ultrasound, somewhat ill-defined hypoechoic mildly shadowing lobulated area was noted in the 10 o'clock position 5 cm from the nipple, more discretely seen on the antiradial than on the radial  view. Measures approximately 14 mm in greatest dimension. There was no other suspicious finding. Although this finding may reflect the mammographic abnormality, this is not conclusive.  Overall, the constellation of findings in the right breast are suspicious enough to warrant biopsy, despite the history of a previous benign biopsy in this location. Since the mammographic and ultrasound abnormalities are not conclusively this same finding, stereotactic breast biopsy is recommended.  IMPRESSION:  REVISED :  SUSPICIOUS ABNORMALITY IN THE RIGHT BREAST.  THIS  COULD REFLECT AN INVASIVE LOBULAR CARCINOMA.: IMPRESSION: REVISED : SUSPICIOUS ABNORMALITY IN THE RIGHT BREAST. THIS COULD REFLECT AN INVASIVE LOBULAR CARCINOMA. RECOMMENDATION:  STEREOTACTIC BREAST BIOPSY.  Patient is scheduled for right breast stereotactic breast biopsy on January 20th at a.m.  CATEGORY : Revised: 4: Suspicious abnormality - biopsy should be considered.   Electronically Signed   By: Lajean Manes M.D.   On: 05/09/2013 14:36   05/09/2013   CLINICAL DATA:  Call back from screening for an asymmetry in the right breast. Patient has had a previous benign biopsy in the location of the asymmetry, performed in 2005.  EXAM: DIGITAL DIAGNOSTIC  RIGHT MAMMOGRAM  ULTRASOUND RIGHT BREAST  COMPARISON:  04/15/2013 and prior studies.  ACR Breast Density Category c: The breast tissue is heterogeneously dense, which may obscure small masses.  FINDINGS: On spot compression imaging, the area of focal asymmetry persists, but has irregular, somewhat ill-defined margins. There is associated mild architectural distortion.  On physical exam, there is a scar for 4-5 cm above the nipple. No discrete mass is palpated.  Ultrasound is performed, showing a dense calcification reflecting a benign dystrophic calcification, which mammographically lies in close proximity to the area of asymmetry and architectural distortion. No other sonographic abnormality is noted in the upper  left breast. No sonographic abnormality is seen to correspond to the mammographic abnormality.  IMPRESSION: No evidence of malignancy. The area of asymmetry and architectural distortion is presumed to be from the prior excisional biopsy. On review of the tomosynthesis performed on the current screening study, this does not appear to be mass, but an area asymmetric density and architectural distortion consistent with scarring.  RECOMMENDATION: Screening mammogram in one year.(Code:SM-B-01Y)  I have discussed the findings and recommendations with the patient. Results were also provided in writing at the conclusion of the visit. If applicable, a reminder letter will be sent to the patient regarding the next appointment.  BI-RADS CATEGORY  2: Benign Finding(s)  Electronically Signed: By: Lajean Manes M.D. On: 05/06/2013 16:41   Mm Radiologist Eval And Mgmt  05/15/2013   CONSULTATION: HPI: The patient returns for a followup after an ultrasound-guided biopsy of a suspicious mass in the right breast at 10 o'clock. The patient is doing well and denies any biopsy site complications.  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 This is concordant with the imaging findings.  Biopsy site: Clean, dry, and intact. Steri-Strips and two Band-Aids overlie small skin incision.  PLAN: All the patient's questions were answered. She is aware of the results and demonstrates understanding. The patient is scheduled to follow-up in Multidisciplinary Clinic 05/22/2013 and a breast MRI is scheduled on 05/20/2013 at 10:15 a.m.   Electronically Signed   By: Everlean Alstrom M.D.   On: 05/15/2013 15:28   US Breast Ltd Uni Right Inc Axilla  05/09/2013   ADDENDUM REPORT: 05/09/2013 14:36  ADDENDUM: I have reviewed this patient's imaging, which has discrepant findings. The most concerning findings are what appears to be a progression of focal density in the right breast in the region of the previous  biopsy. This also appeared concerning on spot compression imaging. For this reason, the patient was asked to return for a repeat right breast ultrasound.  Repeat right breast ultrasound, somewhat ill-defined hypoechoic mildly shadowing lobulated area was noted in the 10 o'clock position 5 cm from the nipple, more discretely seen on the antiradial than on the radial view. Measures approximately 14 mm in greatest dimension. There was no other suspicious finding. Although this finding may reflect the mammographic abnormality, this is not conclusive.  Overall, the constellation of findings in the right breast are suspicious enough to warrant biopsy, despite the history of a previous benign biopsy in  this location. Since the mammographic and ultrasound abnormalities are not conclusively this same finding, stereotactic breast biopsy is recommended.  IMPRESSION:  REVISED :  SUSPICIOUS ABNORMALITY IN THE RIGHT BREAST.  THIS  COULD REFLECT AN INVASIVE LOBULAR CARCINOMA.: IMPRESSION: REVISED : SUSPICIOUS ABNORMALITY IN THE RIGHT BREAST. THIS COULD REFLECT AN INVASIVE LOBULAR CARCINOMA. RECOMMENDATION:  STEREOTACTIC BREAST BIOPSY.  Patient is scheduled for right breast stereotactic breast biopsy on January 20th at a.m.  CATEGORY : Revised: 4: Suspicious abnormality - biopsy should be considered.   Electronically Signed   By: Lajean Manes M.D.   On: 05/09/2013 14:36   05/09/2013   CLINICAL DATA:  Call back from screening for an asymmetry in the right breast. Patient has had a previous benign biopsy in the location of the asymmetry, performed in 2005.  EXAM: DIGITAL DIAGNOSTIC  RIGHT MAMMOGRAM  ULTRASOUND RIGHT BREAST  COMPARISON:  04/15/2013 and prior studies.  ACR Breast Density Category c: The breast tissue is heterogeneously dense, which may obscure small masses.  FINDINGS: On spot compression imaging, the area of focal asymmetry persists, but has irregular, somewhat ill-defined margins. There is associated mild  architectural distortion.  On physical exam, there is a scar for 4-5 cm above the nipple. No discrete mass is palpated.  Ultrasound is performed, showing a dense calcification reflecting a benign dystrophic calcification, which mammographically lies in close proximity to the area of asymmetry and architectural distortion. No other sonographic abnormality is noted in the upper left breast. No sonographic abnormality is seen to correspond to the mammographic abnormality.  IMPRESSION: No evidence of malignancy. The area of asymmetry and architectural distortion is presumed to be from the prior excisional biopsy. On review of the tomosynthesis performed on the current screening study, this does not appear to be mass, but an area asymmetric density and architectural distortion consistent with scarring.  RECOMMENDATION: Screening mammogram in one year.(Code:SM-B-01Y)  I have discussed the findings and recommendations with the patient. Results were also provided in writing at the conclusion of the visit. If applicable, a reminder letter will be sent to the patient regarding the next appointment.  BI-RADS CATEGORY  2: Benign Finding(s)  Electronically Signed: By: Lajean Manes M.D. On: 05/06/2013 16:41   [place] Rt Breast Bx W Loc Dev 1st Lesion Img Bx Spec US Guide  05/14/2013   CLINICAL DATA:  1.5 cm irregular mass at 10 o'clock 5 cm from the right nipple.  EXAM: ULTRASOUND GUIDED RIGHT BREAST CORE NEEDLE BIOPSY WITH VACUUM ASSIST  COMPARISON:  Previous exams.  PROCEDURE: I met with the patient and we discussed the procedure of ultrasound-guided biopsy, including benefits and alternatives. We discussed the high likelihood of a successful procedure. We discussed the risks of the procedure including infection, bleeding, tissue injury, clip migration, and inadequate sampling. Informed written consent was given. The usual time-out protocol was performed immediately prior to the procedure.  Using sterile technique, 2% Lidocaine  as local anesthetic, and direct ultrasound visualization, a 12 gauge vacuum-assisteddevice was used to perform biopsy of the mass at 10 o'clock 5 cm from the right nipple using a mediolateral approach. At the conclusion of the procedure, a ribbon tissue marker clip was deployed into the biopsy cavity. Follow-up 2-view mammogram was performed and dictated separately.  IMPRESSION: Ultrasound-guided biopsy of a mass at 10 o'clock 5 cm from the right nipple. No apparent complications.   Electronically Signed   By: Ulyess Blossom M.D.   On: 05/14/2013 09:13  IMPRESSION: The patient has a recent diagnosis of invasive ductal carcinoma of the right breast, clinically at this time T2, N0, M0. As noted above, the patient has a small lesion noted within the sternum which Dr. Jana Hakim  Is going to proceed with additional imaging. I do not have a strong suspicion that this represents metastatic disease and therefore the clinical stage I believe at this point is M0.   The patient is felt to likely be a good candidate for breast conservation treatment and a lumpectomy has been discussed with the patient today in clinic.  I discussed with the patient the role of adjuvant radiotherapy in this setting. We discussed the improvement expected in terms of local/regional control. We also discussed the possible side effects and risks of treatment as well. All of the patient's questions were answered.  The patient is interested in proceeding with further workup and ultimately a likely lumpectomy. The patient also is being seen by medical oncology and the role of systemic treatment including chemotherapy will be further evaluated after additional information is obtained from surgery.  PLAN: The patient will be seen postoperatively for evaluation and to coordinate an anticipated course of adjuvant radiation treatment.     I spent 60 minutes face to face with the patient and more than 50% of that time was spent in  counseling and/or coordination of care.    ________________________________   Jodelle Gross, MD, PhD

## 2013-05-23 NOTE — Progress Notes (Signed)
To go for cxr-had labs cc-05/22/13

## 2013-05-24 ENCOUNTER — Ambulatory Visit
Admission: RE | Admit: 2013-05-24 | Discharge: 2013-05-24 | Disposition: A | Discharge status: Home / Self Care | Payer: Medicare PPO | Source: Ambulatory Visit | Attending: General Surgery | Admitting: General Surgery

## 2013-05-27 ENCOUNTER — Other Ambulatory Visit (HOSPITAL_COMMUNITY): Payer: Medicare PPO

## 2013-05-27 ENCOUNTER — Telehealth: Payer: Self-pay | Admitting: *Deleted

## 2013-05-27 NOTE — H&P (Signed)
Monica Diaz   MRN:  [id number]   Description: 73 year old female  Provider: Adin Hector, MD  Department: [facility]          Diagnoses      Breast cancer of upper-outer quadrant of right female breast    -  Primary      174.4             History and Physical     Adin Hector, MD       Status: Signed            Patient ID: Monica Diaz, female   DOB: [birth year], 73 y.o.   MRN: [id number]            HPI Monica Diaz is a 73 y.o. female.  She is referred by Dr. Ulyess Blossom for evaluation and management of an invasive ductal carcinoma of the right breast, upper outer quadrant. Dr. Birdie Riddle is her PCP. She is being evaluated in the be in the [facility]  today by Dr. Jana Hakim, Dr. Lisbeth Renshaw, and me.    She has no significant prior breast problems. She was told that she had fibroadenomas in college. She had open right breast biopsy in 2005 for [facility] for benign disease.   Recent screening mammograms and ultrasound show a 14 mm lobulated density in the right breast at the 10:00 position, 5 cm from the nipple. 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 ER 99%, PR 50%, T6-T7 91%, HER-2-negative. Subsequent MRI shows a 2.3 cm enhancing mass in the upper outer right breast. No significant hematoma. The rest of the breast and lymph nodes looked normal. As a solitary finding. Also noted was a 6 mm enhancing sternal lesion. Dr. Jana Hakim is considering whether to do MRI or PET scan.   Family history is negative for breast or ovarian cancer.   Comorbidities include obstructive sleep apnea with CPAP at night, hyperlipidemia, TAH and LSO in the past.        Past Medical History   Diagnosis  Date   .  Hyperlipidemia     .  Osteoporosis     .  Thyroid disease     .  Sleep apnea           Past Surgical History   Procedure  Laterality  Date   .  Abdominal hysterectomy            fibroids   .  Left oophorectomy             Family History   Problem  Relation  Age of Onset   .  Hypertension  Mother     .  Diabetes  Maternal Aunt     .  Stroke           grandfather        Social History History   Substance Use Topics   .  Smoking status:  Never Smoker    .  Smokeless tobacco:  Not on file   .  Alcohol Use:  Yes         Comment: occasionally         Allergies   Allergen  Reactions   .  Codeine         REACTION: sick to stomach         Current Outpatient Prescriptions   Medication  Sig  Dispense  Refill   .  alendronate (FOSAMAX) 70 MG tablet  Take 1 tablet (70 mg total) by mouth every 7 (seven) days. Take with a full glass of water on an empty stomach.   30 tablet   1   .  calcium citrate-vitamin D (CITRACAL+D) 315-200 MG-UNIT per tablet  Take 1 tablet by mouth 2 (two) times daily.         .  Coenzyme Q10 (COQ10) 200 MG CAPS  Take 1 capsule by mouth at bedtime.         .  CRESTOR 10 MG tablet  TAKE 1 TABLET BY MOUTH DAILY   30 tablet   3   .  loratadine (CLARITIN) 10 MG tablet  Take 10 mg by mouth daily as needed.          .  mometasone (NASONEX) 50 MCG/ACT nasal spray  Place 2 sprays into the nose daily as needed.         .  Multiple Vitamins-Minerals (CENTRUM SILVER PO)  Take 1 tablet by mouth.         Marland Kitchen  omeprazole (PRILOSEC) 10 MG capsule  Take 10 mg by mouth.         Marland Kitchen  OVER THE COUNTER MEDICATION  Mega Red Capsules 1-2 per month         .  OVER THE COUNTER MEDICATION  Take 2 capsules by mouth daily. Collagen Renew Supplement         .  thyroid (ARMOUR) 30 MG tablet  Take 1 tablet (30 mg total) by mouth daily.   30 tablet   11   .  vitamin B-12 (CYANOCOBALAMIN) 50 MCG tablet  Take 50 mcg by mouth.             No current facility-administered medications for this visit.        Review of Systems  Constitutional: Negative for fever, chills and unexpected weight change.  HENT: Negative for congestion, hearing loss, sore throat,  trouble swallowing and voice change.   Eyes: Negative for visual disturbance.  Respiratory: Positive for apnea. Negative for cough and wheezing.   Cardiovascular: Negative for chest pain, palpitations and leg swelling.  Gastrointestinal: Negative for nausea, vomiting, abdominal pain, diarrhea, constipation, blood in stool, abdominal distention and anal bleeding.  Genitourinary: Negative for hematuria, vaginal bleeding and difficulty urinating.  Musculoskeletal: Negative for arthralgias.  Skin: Negative for rash and wound.  Neurological: Negative for seizures, syncope and headaches.  Hematological: Negative for adenopathy. Does not bruise/bleed easily.  Psychiatric/Behavioral: Negative for confusion.      There were no vitals taken for this visit.   Physical Exam   Constitutional: She is oriented to person, place, and time. She appears well-developed and well-nourished. No distress.  HENT:   Head: Normocephalic and atraumatic.   Nose: Nose normal.   Mouth/Throat: No oropharyngeal exudate.  Eyes: Conjunctivae and EOM are normal. Pupils are equal, round, and reactive to light. Left eye exhibits no discharge. No scleral icterus.  Neck: Neck supple. No JVD present. No tracheal deviation present. No thyromegaly present.  Cardiovascular: Normal rate, regular rhythm, normal heart sounds and intact distal pulses.    No murmur heard. Pulmonary/Chest: Effort normal and breath sounds normal. No respiratory distress. She has no wheezes. She has no rales. She exhibits no tenderness.    Bruise and a vague 1.5 cm palpable mass, 10:00 position right breast, just outside the areolar margin. Well-healed scar upper outer quadrant right breast.  Abdominal: Soft. Bowel sounds are normal.  She exhibits no distension and no mass. There is no tenderness. There is no rebound and no guarding.  Well healed Pfannenstiel scar.  Musculoskeletal: She exhibits no edema and no tenderness.  Lymphadenopathy:    She  has no cervical adenopathy.  Neurological: She is alert and oriented to person, place, and time. She exhibits normal muscle tone. Coordination normal.  Skin: Skin is warm. No rash noted. She is not diaphoretic. No erythema. No pallor.  Psychiatric: She has a normal mood and affect. Her behavior is normal. Judgment and thought content normal.      Data Reviewed All imaging studies and pathology. Case discussed in conference this morning.treatment plan coordinated with Dr. Jana Hakim and Dr. Lisbeth Renshaw.   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 Clinical stage T2, N0    Obstructive sleep apnea with CPAP use at night   Hyperlipidemia   Status post TAH and LSO.   Question of 6 mm sternal nodule, enhancing on MRI.     Plan    I had a long discussion with the patient and her family regarding her breast cancer diagnosis, its implications and natural history, all treatment options with particular attention to surgical options. At the end of the discussion she is strongly motivated for breast conservation, and I think she is a good candidate for that.   She is aware of the small sternal nodule. She is aware that Dr. Jana Hakim will further work that up   She will be scheduled for right partial mastectomy with needle localization and right axillary sentinel lymph node biopsy.   She is aware that she may or may not require chemotherapy, but no decision has been made this time   I have discussed indications, details, techniques, and numerous risks of the surgery with her and her family. She is aware of the risk of bleeding, infection, reoperation for positive margins, cosmetic deformity, arm swelling and numbness, and other unforseen problems. She understands all these issues. All of her questions are answered. She agrees with this plan.           Edsel Petrin. Dalbert Batman, M.D.,  [facility] General and Minimally invasive Surgery Breast and Colorectal Surgery Office:   [phone] Pager:   [phone]

## 2013-05-27 NOTE — Telephone Encounter (Signed)
Faxed Care Plan to PCP and took to Med [facility] to scan.  

## 2013-05-28 ENCOUNTER — Ambulatory Visit (HOSPITAL_BASED_OUTPATIENT_CLINIC_OR_DEPARTMENT_OTHER): Payer: Medicare PPO | Admitting: Certified Registered"

## 2013-05-28 ENCOUNTER — Encounter (HOSPITAL_BASED_OUTPATIENT_CLINIC_OR_DEPARTMENT_OTHER): Payer: Self-pay | Admitting: Certified Registered"

## 2013-05-28 ENCOUNTER — Ambulatory Visit
Admission: RE | Admit: 2013-05-28 | Discharge: 2013-05-28 | Disposition: A | Discharge status: Home / Self Care | Payer: Medicare PPO | Source: Ambulatory Visit | Attending: General Surgery | Admitting: General Surgery

## 2013-05-28 ENCOUNTER — Encounter (HOSPITAL_BASED_OUTPATIENT_CLINIC_OR_DEPARTMENT_OTHER)
Admission: RE | Disposition: A | Discharge status: Home / Self Care | Payer: Self-pay | Source: Ambulatory Visit | Attending: General Surgery

## 2013-05-28 ENCOUNTER — Ambulatory Visit (HOSPITAL_BASED_OUTPATIENT_CLINIC_OR_DEPARTMENT_OTHER)
Admission: RE | Admit: 2013-05-28 | Discharge: 2013-05-28 | Disposition: A | Discharge status: Home / Self Care | Payer: Medicare PPO | Source: Ambulatory Visit | Attending: General Surgery | Admitting: General Surgery

## 2013-05-28 ENCOUNTER — Encounter (HOSPITAL_BASED_OUTPATIENT_CLINIC_OR_DEPARTMENT_OTHER): Payer: Medicare PPO | Admitting: Certified Registered"

## 2013-05-28 ENCOUNTER — Encounter (HOSPITAL_COMMUNITY)
Admission: RE | Admit: 2013-05-28 | Discharge: 2013-05-28 | Disposition: A | Discharge status: Home / Self Care | Payer: Medicare PPO | Source: Ambulatory Visit | Attending: General Surgery | Admitting: General Surgery

## 2013-05-28 DIAGNOSIS — Z90710 Acquired absence of both cervix and uterus: Secondary | ICD-10-CM | POA: Insufficient documentation

## 2013-05-28 DIAGNOSIS — G4733 Obstructive sleep apnea (adult) (pediatric): Secondary | ICD-10-CM | POA: Insufficient documentation

## 2013-05-28 DIAGNOSIS — D0590 Unspecified type of carcinoma in situ of unspecified breast: Secondary | ICD-10-CM

## 2013-05-28 DIAGNOSIS — Z170 Estrogen receptor positive status [ER+]: Secondary | ICD-10-CM | POA: Diagnosis present

## 2013-05-28 DIAGNOSIS — C50411 Malignant neoplasm of upper-outer quadrant of right female breast: Secondary | ICD-10-CM

## 2013-05-28 DIAGNOSIS — E039 Hypothyroidism, unspecified: Secondary | ICD-10-CM | POA: Insufficient documentation

## 2013-05-28 DIAGNOSIS — C50419 Malignant neoplasm of upper-outer quadrant of unspecified female breast: Secondary | ICD-10-CM | POA: Insufficient documentation

## 2013-05-28 DIAGNOSIS — E785 Hyperlipidemia, unspecified: Secondary | ICD-10-CM | POA: Insufficient documentation

## 2013-05-28 HISTORY — PX: PARTIAL MASTECTOMY WITH NEEDLE LOCALIZATION AND AXILLARY SENTINEL LYMPH NODE BX: SHX6009

## 2013-05-28 HISTORY — DX: Malignant (primary) neoplasm, unspecified: C80.1

## 2013-05-28 HISTORY — DX: Other seasonal allergic rhinitis: J30.2

## 2013-05-28 HISTORY — DX: Presence of spectacles and contact lenses: Z97.3

## 2013-05-28 HISTORY — DX: Unspecified hearing loss, unspecified ear: H91.90

## 2013-05-28 HISTORY — DX: Unspecified osteoarthritis, unspecified site: M19.90

## 2013-05-28 SURGERY — PARTIAL MASTECTOMY WITH NEEDLE LOCALIZATION AND AXILLARY SENTINEL LYMPH NODE BX
Anesthesia: Regional | Site: Breast | Laterality: Right

## 2013-05-28 MED ORDER — OXYCODONE HCL 5 MG PO TABS
5.0000 mg | ORAL_TABLET | Freq: Once | ORAL | Status: AC | PRN
  Administered 2013-05-28: 5 mg via ORAL

## 2013-05-28 MED ORDER — PROMETHAZINE HCL 25 MG/ML IJ SOLN
6.2500 mg | INTRAMUSCULAR | Status: DC | PRN
  Administered 2013-05-28: 6.25 mg via INTRAVENOUS
  Filled 2013-05-28: qty 1

## 2013-05-28 MED ORDER — FENTANYL CITRATE 0.05 MG/ML IJ SOLN
50.0000 ug | INTRAMUSCULAR | Status: DC | PRN
  Administered 2013-05-28: 100 ug via INTRAVENOUS

## 2013-05-28 MED ORDER — LACTATED RINGERS IV SOLN
INTRAVENOUS | Status: DC
  Administered 2013-05-28 (×3): via INTRAVENOUS

## 2013-05-28 MED ORDER — FENTANYL CITRATE 0.05 MG/ML IJ SOLN: 25.0000 ug | INTRAMUSCULAR | Status: DC | PRN

## 2013-05-28 MED ORDER — METHYLENE BLUE 1 % INJ SOLN
INTRAMUSCULAR | Status: AC
  Filled 2013-05-28: qty 10

## 2013-05-28 MED ORDER — ONDANSETRON HCL 4 MG/2ML IJ SOLN
INTRAMUSCULAR | Status: DC | PRN
  Administered 2013-05-28: 4 mg via INTRAVENOUS

## 2013-05-28 MED ORDER — LIDOCAINE HCL (CARDIAC) 20 MG/ML IV SOLN
INTRAVENOUS | Status: DC | PRN
  Administered 2013-05-28: 30 mg via INTRAVENOUS

## 2013-05-28 MED ORDER — SODIUM CHLORIDE 0.9 % IJ SOLN
INTRAMUSCULAR | Status: AC
  Filled 2013-05-28: qty 10

## 2013-05-28 MED ORDER — SODIUM CHLORIDE 0.9 % IV SOLN: 250.0000 mL | INTRAVENOUS | Status: DC | PRN

## 2013-05-28 MED ORDER — HYDROMORPHONE HCL PF 1 MG/ML IJ SOLN: 0.2500 mg | INTRAMUSCULAR | Status: DC | PRN

## 2013-05-28 MED ORDER — SODIUM CHLORIDE 0.9 % IJ SOLN: 3.0000 mL | Freq: Two times a day (BID) | INTRAMUSCULAR | Status: DC

## 2013-05-28 MED ORDER — SODIUM CHLORIDE 0.9 % IJ SOLN: 3.0000 mL | INTRAMUSCULAR | Status: DC | PRN

## 2013-05-28 MED ORDER — CEFAZOLIN SODIUM-DEXTROSE 2-3 GM-% IV SOLR
2.0000 g | INTRAVENOUS | Status: AC
  Administered 2013-05-28: 2 g via INTRAVENOUS

## 2013-05-28 MED ORDER — CEFAZOLIN SODIUM-DEXTROSE 2-3 GM-% IV SOLR
INTRAVENOUS | Status: AC
  Filled 2013-05-28: qty 50

## 2013-05-28 MED ORDER — FENTANYL CITRATE 0.05 MG/ML IJ SOLN
INTRAMUSCULAR | Status: AC
  Filled 2013-05-28: qty 2

## 2013-05-28 MED ORDER — MIDAZOLAM HCL 2 MG/2ML IJ SOLN
INTRAMUSCULAR | Status: AC
  Filled 2013-05-28: qty 2

## 2013-05-28 MED ORDER — ONDANSETRON HCL 4 MG/2ML IJ SOLN: 4.0000 mg | Freq: Four times a day (QID) | INTRAMUSCULAR | Status: DC | PRN

## 2013-05-28 MED ORDER — SODIUM CHLORIDE 0.9 % IJ SOLN
INTRAMUSCULAR | Status: DC | PRN
  Administered 2013-05-28: 13:00:00 via INTRAMUSCULAR

## 2013-05-28 MED ORDER — OXYCODONE HCL 5 MG PO TABS
ORAL_TABLET | ORAL | Status: AC
  Filled 2013-05-28: qty 1

## 2013-05-28 MED ORDER — ACETAMINOPHEN 650 MG RE SUPP: 650.0000 mg | RECTAL | Status: DC | PRN

## 2013-05-28 MED ORDER — CHLORHEXIDINE GLUCONATE 4 % EX LIQD: 1.0000 "application " | Freq: Once | CUTANEOUS | Status: DC

## 2013-05-28 MED ORDER — EPHEDRINE SULFATE 50 MG/ML IJ SOLN
INTRAMUSCULAR | Status: DC | PRN
  Administered 2013-05-28 (×3): 10 mg via INTRAVENOUS

## 2013-05-28 MED ORDER — ROPIVACAINE HCL 5 MG/ML IJ SOLN
INTRAMUSCULAR | Status: DC | PRN
  Administered 2013-05-28: 30 mL

## 2013-05-28 MED ORDER — FENTANYL CITRATE 0.05 MG/ML IJ SOLN
INTRAMUSCULAR | Status: AC
  Filled 2013-05-28: qty 6

## 2013-05-28 MED ORDER — HYDROCODONE-ACETAMINOPHEN 5-325 MG PO TABS: 1.0000 | ORAL_TABLET | ORAL | Status: DC | PRN

## 2013-05-28 MED ORDER — SODIUM BICARBONATE 4 % IV SOLN
INTRAVENOUS | Status: AC
  Filled 2013-05-28: qty 5

## 2013-05-28 MED ORDER — MIDAZOLAM HCL 2 MG/2ML IJ SOLN
1.0000 mg | INTRAMUSCULAR | Status: DC | PRN
  Administered 2013-05-28: 2 mg via INTRAVENOUS

## 2013-05-28 MED ORDER — FENTANYL CITRATE 0.05 MG/ML IJ SOLN
INTRAMUSCULAR | Status: DC | PRN
  Administered 2013-05-28: 50 ug via INTRAVENOUS

## 2013-05-28 MED ORDER — OXYCODONE HCL 5 MG PO TABS: 5.0000 mg | ORAL_TABLET | ORAL | Status: DC | PRN

## 2013-05-28 MED ORDER — SODIUM CHLORIDE 0.9 % IV SOLN: INTRAVENOUS | Status: DC

## 2013-05-28 MED ORDER — TECHNETIUM TC 99M SULFUR COLLOID FILTERED
1.0000 | Freq: Once | INTRAVENOUS | Status: AC | PRN
  Administered 2013-05-28: 1 via INTRADERMAL

## 2013-05-28 MED ORDER — ACETAMINOPHEN 325 MG PO TABS: 650.0000 mg | ORAL_TABLET | ORAL | Status: DC | PRN

## 2013-05-28 MED ORDER — DEXAMETHASONE SODIUM PHOSPHATE 4 MG/ML IJ SOLN
INTRAMUSCULAR | Status: DC | PRN
  Administered 2013-05-28: 8 mg via INTRAVENOUS

## 2013-05-28 MED ORDER — PROPOFOL 10 MG/ML IV BOLUS
INTRAVENOUS | Status: DC | PRN
  Administered 2013-05-28: 100 mg via INTRAVENOUS

## 2013-05-28 MED ORDER — BUPIVACAINE-EPINEPHRINE PF 0.5-1:200000 % IJ SOLN
INTRAMUSCULAR | Status: AC
  Filled 2013-05-28: qty 30

## 2013-05-28 MED ORDER — OXYCODONE HCL 5 MG/5ML PO SOLN: 5.0000 mg | Freq: Once | ORAL | Status: AC | PRN

## 2013-05-28 SURGICAL SUPPLY — 70 items
ADH SKN CLS APL DERMABOND .7 (GAUZE/BANDAGES/DRESSINGS) ×1
APL SKNCLS STERI-STRIP NONHPOA (GAUZE/BANDAGES/DRESSINGS)
APPLIER CLIP 11 MED OPEN (CLIP) ×3
APR CLP MED 11 20 MLT OPN (CLIP) ×1
BANDAGE ELASTIC 6 VELCRO ST LF (GAUZE/BANDAGES/DRESSINGS) IMPLANT
BENZOIN TINCTURE PRP APPL 2/3 (GAUZE/BANDAGES/DRESSINGS) IMPLANT
BINDER BREAST LRG (GAUZE/BANDAGES/DRESSINGS) ×2 IMPLANT
BLADE HEX COATED 2.75 (ELECTRODE) ×3 IMPLANT
BLADE SURG 10 STRL SS (BLADE) IMPLANT
BLADE SURG 15 STRL LF DISP TIS (BLADE) ×2 IMPLANT
BLADE SURG 15 STRL SS (BLADE) ×6
CANISTER SUCT 1200ML W/VALVE (MISCELLANEOUS) ×3 IMPLANT
CHLORAPREP W/TINT 26ML (MISCELLANEOUS) ×3 IMPLANT
CLIP APPLIE 11 MED OPEN (CLIP) ×1 IMPLANT
CLOSURE WOUND 1/2 X4 (GAUZE/BANDAGES/DRESSINGS)
COVER MAYO STAND STRL (DRAPES) ×3 IMPLANT
COVER PROBE W GEL 5X96 (DRAPES) ×3 IMPLANT
COVER TABLE BACK 60X90 (DRAPES) ×3 IMPLANT
DECANTER SPIKE VIAL GLASS SM (MISCELLANEOUS) IMPLANT
DERMABOND ADVANCED (GAUZE/BANDAGES/DRESSINGS) ×2
DERMABOND ADVANCED .7 DNX12 (GAUZE/BANDAGES/DRESSINGS) IMPLANT
DEVICE DUBIN W/COMP PLATE 8390 (MISCELLANEOUS) ×3 IMPLANT
DRAIN CHANNEL 19F RND (DRAIN) IMPLANT
DRAIN HEMOVAC 1/8 X 5 (WOUND CARE) IMPLANT
DRAPE LAPAROSCOPIC ABDOMINAL (DRAPES) ×3 IMPLANT
DRAPE UTILITY XL STRL (DRAPES) ×3 IMPLANT
ELECT REM PT RETURN 9FT ADLT (ELECTROSURGICAL) ×3
ELECTRODE REM PT RTRN 9FT ADLT (ELECTROSURGICAL) ×1 IMPLANT
EVACUATOR SILICONE 100CC (DRAIN) IMPLANT
GAUZE SPONGE 4X4 16PLY XRAY LF (GAUZE/BANDAGES/DRESSINGS) IMPLANT
GLOVE BIOGEL PI IND STRL 7.5 (GLOVE) IMPLANT
GLOVE BIOGEL PI INDICATOR 7.5 (GLOVE) ×2
GLOVE EUDERMIC 7 POWDERFREE (GLOVE) ×3 IMPLANT
GLOVE SURG SS PI 7.5 STRL IVOR (GLOVE) ×2 IMPLANT
GOWN STRL REUS W/ TWL LRG LVL3 (GOWN DISPOSABLE) ×1 IMPLANT
GOWN STRL REUS W/ TWL XL LVL3 (GOWN DISPOSABLE) ×1 IMPLANT
GOWN STRL REUS W/TWL LRG LVL3 (GOWN DISPOSABLE) ×6
GOWN STRL REUS W/TWL XL LVL3 (GOWN DISPOSABLE) ×3
KIT MARKER MARGIN INK (KITS) ×3 IMPLANT
NDL HYPO 25X1 1.5 SAFETY (NEEDLE) ×2 IMPLANT
NDL SAFETY ECLIPSE 18X1.5 (NEEDLE) ×1 IMPLANT
NEEDLE HYPO 18GX1.5 SHARP (NEEDLE) ×3
NEEDLE HYPO 25X1 1.5 SAFETY (NEEDLE) ×6 IMPLANT
NS IRRIG 1000ML POUR BTL (IV SOLUTION) ×3 IMPLANT
PACK BASIN DAY SURGERY FS (CUSTOM PROCEDURE TRAY) ×3 IMPLANT
PAD ABD 8X10 STRL (GAUZE/BANDAGES/DRESSINGS) ×2 IMPLANT
PAD ALCOHOL SWAB (MISCELLANEOUS) ×3 IMPLANT
PENCIL BUTTON HOLSTER BLD 10FT (ELECTRODE) ×3 IMPLANT
PIN SAFETY STERILE (MISCELLANEOUS) IMPLANT
SHEET MEDIUM DRAPE 40X70 STRL (DRAPES) ×2 IMPLANT
SLEEVE SCD COMPRESS KNEE MED (MISCELLANEOUS) ×2 IMPLANT
SPONGE GAUZE 4X4 12PLY (GAUZE/BANDAGES/DRESSINGS) ×3 IMPLANT
SPONGE GAUZE 4X4 12PLY STER LF (GAUZE/BANDAGES/DRESSINGS) IMPLANT
SPONGE LAP 18X18 X RAY DECT (DISPOSABLE) IMPLANT
SPONGE LAP 4X18 X RAY DECT (DISPOSABLE) ×5 IMPLANT
STRIP CLOSURE SKIN 1/2X4 (GAUZE/BANDAGES/DRESSINGS) IMPLANT
SUT ETHILON 3 0 FSL (SUTURE) IMPLANT
SUT MNCRL AB 4-0 PS2 18 (SUTURE) ×6 IMPLANT
SUT SILK 2 0 SH (SUTURE) ×3 IMPLANT
SUT VIC AB 2-0 CT1 27 (SUTURE)
SUT VIC AB 2-0 CT1 TAPERPNT 27 (SUTURE) IMPLANT
SUT VIC AB 3-0 SH 27 (SUTURE)
SUT VIC AB 3-0 SH 27X BRD (SUTURE) IMPLANT
SUT VICRYL 3-0 CR8 SH (SUTURE) ×3 IMPLANT
SYR CONTROL 10ML LL (SYRINGE) ×6 IMPLANT
TOWEL OR 17X24 6PK STRL BLUE (TOWEL DISPOSABLE) ×4 IMPLANT
TOWEL OR NON WOVEN STRL DISP B (DISPOSABLE) ×1 IMPLANT
TUBE CONNECTING 20'X1/4 (TUBING) ×1
TUBE CONNECTING 20X1/4 (TUBING) ×2 IMPLANT
YANKAUER SUCT BULB TIP NO VENT (SUCTIONS) ×3 IMPLANT

## 2013-05-28 NOTE — Transfer of Care (Signed)
Immediate Anesthesia Transfer of Care Note  Patient: Monica Diaz  Procedure(s) Performed: Procedure(s): PARTIAL MASTECTOMY WITH NEEDLE LOCALIZATION AND AXILLARY SENTINEL LYMPH NODE BIOPSY (Right)  Patient Location: PACU  Anesthesia Type:GA combined with regional for post-op pain  Level of Consciousness: awake, alert , oriented and patient cooperative  Airway & Oxygen Therapy: Patient Spontanous Breathing and Patient connected to face mask oxygen  Post-op Assessment: Report given to PACU RN and Post -op Vital signs reviewed and stable  Post vital signs: Reviewed and stable  Complications: No apparent anesthesia complications

## 2013-05-28 NOTE — Discharge Instructions (Signed)
[facility] °Office Phone Number [phone] ° °BREAST BIOPSY/ PARTIAL MASTECTOMY: POST OP INSTRUCTIONS ° °Always review your discharge instruction sheet given to you by the facility where your surgery was performed. ° °IF YOU HAVE DISABILITY OR FAMILY LEAVE FORMS, YOU MUST BRING THEM TO THE OFFICE FOR PROCESSING.  DO NOT GIVE THEM TO YOUR DOCTOR. ° °1. A prescription for pain medication may be given to you upon discharge.  Take your pain medication as prescribed, if needed.  If narcotic pain medicine is not needed, then you may take acetaminophen (Tylenol) or ibuprofen (Advil) as needed. °2. Take your usually prescribed medications unless otherwise directed °3. If you need a refill on your pain medication, please contact your pharmacy.  They will contact our office to request authorization.  Prescriptions will not be filled after 5pm or on week-ends. °4. You should eat very light the first 24 hours after surgery, such as soup, crackers, pudding, etc.  Resume your normal diet the day after surgery. °5. Most patients will experience some swelling and bruising in the breast.  Ice packs and a good support bra will help.  Swelling and bruising can take several days to resolve.  °6. It is common to experience some constipation if taking pain medication after surgery.  Increasing fluid intake and taking a stool softener will usually help or prevent this problem from occurring.  A mild laxative (Milk of Magnesia or Miralax) should be taken according to package directions if there are no bowel movements after 48 hours. °7. Unless discharge instructions indicate otherwise, you may remove your bandages 24-48 hours after surgery, and you may shower at that time.  You may have steri-strips (small skin tapes) in place directly over the incision.  These strips should be left on the skin for 7-10 days.  If your surgeon used skin glue on the incision, you may shower in 24 hours.  The glue will flake off over the  next 2-3 weeks.  Any sutures or staples will be removed at the office during your follow-up visit. °8. ACTIVITIES:  You may resume regular daily activities (gradually increasing) beginning the next day.  Wearing a good support bra or sports bra minimizes pain and swelling.  You may have sexual intercourse when it is comfortable. °a. You may drive when you no longer are taking prescription pain medication, you can comfortably wear a seatbelt, and you can safely maneuver your car and apply brakes. °b. RETURN TO WORK:  ______________________________________________________________________________________ °9. You should see your doctor in the office for a follow-up appointment approximately two weeks after your surgery.  Your doctor’s nurse will typically make your follow-up appointment when she calls you with your pathology report.  Expect your pathology report 2-3 business days after your surgery.  You may call to check if you do not hear from us after three days. °10. OTHER INSTRUCTIONS: _______________________________________________________________________________________________ _____________________________________________________________________________________________________________________________________ °_____________________________________________________________________________________________________________________________________ °_____________________________________________________________________________________________________________________________________ ° °WHEN TO CALL YOUR DOCTOR: °1. Fever over 101.0 °2. Nausea and/or vomiting. °3. Extreme swelling or bruising. °4. Continued bleeding from incision. °5. Increased pain, redness, or drainage from the incision. ° °The clinic staff is available to answer your questions during regular business hours.  Please don’t hesitate to call and ask to speak to one of the nurses for clinical concerns.  If you have a medical emergency, go to the nearest  emergency room or call 911.  A surgeon from [facility] is always on call at the hospital. ° °For further questions, please visit [email or web address]  ° ° °  Post Anesthesia Home Care Instructions ° °Activity: °Get plenty of rest for the remainder of the day. A responsible adult should stay with you for 24 hours following the procedure.  °For the next 24 hours, DO NOT: °-Drive a car °-Operate machinery °-Drink alcoholic beverages °-Take any medication unless instructed by your physician °-Make any legal decisions or sign important papers. ° °Meals: °Start with liquid foods such as gelatin or soup. Progress to regular foods as tolerated. Avoid greasy, spicy, heavy foods. If nausea and/or vomiting occur, drink only clear liquids until the nausea and/or vomiting subsides. Call your physician if vomiting continues. ° °Special Instructions/Symptoms: °Your throat may feel dry or sore from the anesthesia or the breathing tube placed in your throat during surgery. If this causes discomfort, gargle with warm salt water. The discomfort should disappear within 24 hours. ° °

## 2013-05-28 NOTE — Progress Notes (Signed)
Assisted Dr. Ola Spurr with right, ultrasound guided, pectoralis block. Side rails up, monitors on throughout procedure. See vital signs in flow sheet. Tolerated Procedure well.

## 2013-05-28 NOTE — Interval H&P Note (Signed)
History and Physical Interval Note:  05/28/2013 10:43 AM  Monica Diaz  has presented today for surgery, with the diagnosis of invasive cancer right breast   The various methods of treatment have been discussed with the patient and family. After consideration of risks, benefits and other options for treatment, the patient has consented to  Procedure(s): PARTIAL MASTECTOMY WITH NEEDLE LOCALIZATION AND AXILLARY SENTINEL LYMPH NODE BX (Right) as a surgical intervention .  The patient's history has been reviewed, patient examined, no change in status, stable for surgery.  I have reviewed the patient's chart and labs.  Questions were answered to the patient's satisfaction.     Adin Hector

## 2013-05-28 NOTE — Anesthesia Procedure Notes (Addendum)
Anesthesia Regional Block:  Pectoralis block  Pre-Anesthetic Checklist: ,, timeout performed, Correct Patient, Correct Site, Correct Laterality, Correct Procedure, Correct Position, site marked, Risks and benefits discussed, pre-op evaluation, post-op pain management  Laterality: Right  Prep: chloraprep       Needles:  Injection technique: Single-shot  Needle Type: Echogenic Stimulator Needle      Needle Gauge: 21 and 21 G    Additional Needles:  Procedures: ultrasound guided (picture in chart) Pectoralis block Narrative:  Start time: 05/28/2013 11:25 AM End time: 05/28/2013 11:38 AM Injection made incrementally with aspirations every 5 mL.  Performed by: Personally  Anesthesiologist: Fitzgerald,MD  Additional Notes: 2% Lidocaine for skin.   Procedure Name: LMA Insertion Date/Time: 05/28/2013 12:24 PM Performed by: Jeda Pardue Pre-anesthesia Checklist: Patient identified, Emergency Drugs available, Suction available and Patient being monitored Patient Re-evaluated:Patient Re-evaluated prior to inductionOxygen Delivery Method: Circle System Utilized Preoxygenation: Pre-oxygenation with 100% oxygen Intubation Type: IV induction Ventilation: Mask ventilation without difficulty LMA: LMA inserted LMA Size: 4.0 Number of attempts: 1 Airway Equipment and Method: bite block Placement Confirmation: positive ETCO2 Tube secured with: Tape Dental Injury: Teeth and Oropharynx as per pre-operative assessment

## 2013-05-28 NOTE — Anesthesia Postprocedure Evaluation (Signed)
  Anesthesia Post-op Note  Patient: Monica Diaz  Procedure(s) Performed: Procedure(s): PARTIAL MASTECTOMY WITH NEEDLE LOCALIZATION AND AXILLARY SENTINEL LYMPH NODE BIOPSY (Right)  Patient Location: PACU  Anesthesia Type:General and PECS block  Level of Consciousness: awake and alert   Airway and Oxygen Therapy: Patient Spontanous Breathing  Post-op Pain: none  Post-op Assessment: Post-op Vital signs reviewed, Patient's Cardiovascular Status Stable and Respiratory Function Stable  Post-op Vital Signs: Reviewed  Filed Vitals:   05/28/13 1430  BP: 153/85  Pulse: 87  Temp:   Resp: 21    Complications: No apparent anesthesia complications

## 2013-05-28 NOTE — Anesthesia Preprocedure Evaluation (Addendum)
Anesthesia Evaluation  Patient identified by MRN, date of birth, ID band Patient awake    Reviewed: Allergy & Precautions, H&P , NPO status , Patient's Chart, lab work & pertinent test results  Airway Mallampati: I TM Distance: >3 FB Neck ROM: Full    Dental no notable dental hx. (+) Teeth Intact and Dental Advisory Given   Pulmonary sleep apnea and Continuous Positive Airway Pressure Ventilation ,  breath sounds clear to auscultation  Pulmonary exam normal       Cardiovascular negative cardio ROS  Rhythm:Regular Rate:Normal     Neuro/Psych negative neurological ROS  negative psych ROS   GI/Hepatic negative GI ROS, Neg liver ROS,   Endo/Other  Hypothyroidism   Renal/GU negative Renal ROS  negative genitourinary   Musculoskeletal   Abdominal   Peds  Hematology negative hematology ROS (+)   Anesthesia Other Findings   Reproductive/Obstetrics negative OB ROS                          Anesthesia Physical Anesthesia Plan  ASA: III  Anesthesia Plan: General and Regional   Post-op Pain Management:    Induction: Intravenous  Airway Management Planned: LMA  Additional Equipment:   Intra-op Plan:   Post-operative Plan: Extubation in OR  Informed Consent: I have reviewed the patients History and Physical, chart, labs and discussed the procedure including the risks, benefits and alternatives for the proposed anesthesia with the patient or authorized representative who has indicated his/her understanding and acceptance.   Dental advisory given  Plan Discussed with: CRNA  Anesthesia Plan Comments:         Anesthesia Quick Evaluation

## 2013-05-28 NOTE — Op Note (Signed)
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 Clinical stage T2, N0      Post op Diagnosis:    same  Procedure:                 Inject blue dye right breast, right partial mastectomy with needle localization, right axillary sentinel node biopsy  Surgeon:                     Edsel Petrin. Dalbert Batman, M.D., FACS  Assistant:                      None  Operative Indications:   Monica Diaz is a 73 y.o. female. She is referred by Dr. Ulyess Blossom for evaluation and management of an invasive ductal carcinoma of the right breast, upper outer quadrant. Dr. Birdie Riddle is her PCP. She was recently evaluated  in the [facility]  by Dr. Jana Hakim, Dr. Lisbeth Renshaw, and me.  She has no significant prior breast problems. She was told that she had fibroadenomas in college. She had open right breast biopsy in the upper outer quadrant of this breast  in 2005 for [facility] for benign disease.  Recent screening mammograms and ultrasound show a 14 mm lobulated density in the right breast at the 10:00 position, 5 cm from the nipple. Image guided biopsy showed high-grade ductal carcinoma in situ and invasive ductal carcinoma. ER 99%, PR 50%, Ki-67 91%, HER-2-negative. Subsequent MRI shows a 2.3 cm enhancing mass in the upper outer right breast. No significant hematoma. The rest of the breast and lymph nodes looked normal. As a solitary finding. Also noted was a 6 mm enhancing sternal lesion. Dr. Jana Hakim is planning a PET scan. She is strongly motivated for breast conservation surgery.  Family history is negative for breast or ovarian cancer.  Comorbidities include obstructive sleep apnea with CPAP at night, hyperlipidemia, TAH and LSO in the past   Operative Findings:       The localizing wire was well-placed. It entered from the lateral breast  and went toward the cancer at the 10:00 position. She had had a previous biopsy very near this area and so the tissues were somewhat fibrotic, making palpation somewhat difficult. Because of this I took more tissue than I normally would. The specimen mammogram looked good with the localizing wire marker clip centered within the specimen. We found 2 sentinel lymph nodes that took up the radioactivity and some of the blue dye.  Procedure in Detail:          The localizing wire was placed at the breast center [facility] and then the patient was brought to the holding area where she underwent injection of radionuclide. She was taken to the operating room and underwent  general anesthesia with an LMA device. Surgical time out was performed. Following alcohol prep I injected 5 cc of blue dye into the right breast, subareolar area. We massaged the breast for a few minutes. The right breast and axilla and chest wall were then prepped and draped in a sterile fashion.  Dr. Ola Spurr had performed a pectoral block in the holding area, and no local anesthesia was used.      I  made a curvilinear incision in the upper outer quadrant of the right breast. Dissection was taken down into the breast tissue and widely around the localizing wire at the 10:00 position. There was lots of chronic scar tissue in this area. The specimen was removed and marked with silk sutures and the 6 . Specimen mammogram good as described above. This wound was copiously irrigated with saline. Hemostasis was excellent. The deeper tissues were closed with the rectus sutures of 3-0 Vicryl and the skin closed with a running subcuticular suture of 4-0 Monocryl and Dermabond.      I made an incision the right axilla at the hairline. Dissection was carried down through the subcutaneous tissue and the clavipectoral fascia was incised. Using the neoprobe I found two sentinel lymph nodes that were very hot and somewhat blue. These were sent to the lab. A  little bit of additional tissue was excised in the course of the dissection. Hemostasis was excellent and achieved with electrocautery. After irrigating the wound the deeper tissues were closed with 3-0 Vicryl sutures and skin closed with running subcuticular suture for Monocryl Dermabond. Breast mound was placed and the patient taken to PACU in stable condition. EBL 20 cc. Counts correct. Consultations none.     Edsel Petrin. Dalbert Batman, M.D., FACS General and Minimally Invasive Surgery Breast and Colorectal Surgery  05/28/2013 1:37 PM

## 2013-05-29 ENCOUNTER — Telehealth: Payer: Self-pay | Admitting: *Deleted

## 2013-05-29 NOTE — Telephone Encounter (Signed)
I called patient to f/u [facility] on 05/22/13 and to check in following surgery 05/28/13.  Patient reports that she is doing very well.  She denies any pain except a little pulling of the stitches under her arm which has not needed any pain medication.  We reviewed her upcoming appointments.  Patient verbalized understanding and denied any questions or concerns about her diagnosis or treatment plan.  I gave patient my contact information and encouraged her to call me for any needs.

## 2013-05-30 ENCOUNTER — Encounter (HOSPITAL_BASED_OUTPATIENT_CLINIC_OR_DEPARTMENT_OTHER): Payer: Self-pay | Admitting: General Surgery

## 2013-05-31 ENCOUNTER — Telehealth (INDEPENDENT_AMBULATORY_CARE_PROVIDER_SITE_OTHER): Payer: Self-pay | Admitting: *Deleted

## 2013-05-31 NOTE — Telephone Encounter (Signed)
Message copied by Theora Master on Fri May 31, 2013 12:47 PM ------      Message from: Monica Diaz      Created: Fri May 31, 2013 11:24 AM       Inform patient of Pathology report,.Tell her that the breast cancer was 2.4 cm in diameter and has been completely removed with a good margin and she will not need any further surgery. That is good news. Also tell her that her lymph nodes are negative for cancer and that is excellent news. I will discuss this with her in detail at her first postop visit.            hmi       ------

## 2013-05-31 NOTE — Telephone Encounter (Signed)
Called patient and gave her the below message from Dr. Dalbert Batman.  Patient states understanding of the information.

## 2013-05-31 NOTE — Progress Notes (Signed)
Quick Note:  Inform patient of Pathology report,.Tell her that the breast cancer was 2.4 cm in diameter and has been completely removed with a good margin and she will not need any further surgery. That is good news. Also tell her that her lymph nodes are negative for cancer and that is excellent news. I will discuss this with her in detail at her first postop visit.  hmi  ______

## 2013-06-04 ENCOUNTER — Encounter (HOSPITAL_COMMUNITY): Payer: Self-pay

## 2013-06-04 ENCOUNTER — Ambulatory Visit (HOSPITAL_COMMUNITY)
Admission: RE | Admit: 2013-06-04 | Discharge: 2013-06-04 | Disposition: A | Discharge status: Home / Self Care | Payer: Medicare PPO | Source: Ambulatory Visit | Attending: Oncology | Admitting: Oncology

## 2013-06-04 DIAGNOSIS — C50919 Malignant neoplasm of unspecified site of unspecified female breast: Secondary | ICD-10-CM | POA: Insufficient documentation

## 2013-06-04 DIAGNOSIS — Z9010 Acquired absence of unspecified breast and nipple: Secondary | ICD-10-CM | POA: Insufficient documentation

## 2013-06-04 DIAGNOSIS — C50411 Malignant neoplasm of upper-outer quadrant of right female breast: Secondary | ICD-10-CM

## 2013-06-04 LAB — GLUCOSE, CAPILLARY: Glucose-Capillary: 89 mg/dL (ref 70–99)

## 2013-06-04 MED ORDER — FLUDEOXYGLUCOSE F - 18 (FDG) INJECTION
9.2000 | Freq: Once | INTRAVENOUS | Status: AC | PRN
Start: 2013-06-04 — End: 2013-06-04
  Administered 2013-06-04: 9.2 via INTRAVENOUS

## 2013-06-13 ENCOUNTER — Encounter: Payer: Self-pay | Admitting: *Deleted

## 2013-06-13 NOTE — [facility] Oncology Navigator Note (Signed)
Oncotype ordered 06/13/13. Faxed requisition to Pathology and confirmed receipts with [facility]. Emailed Dr. Jana Hakim.

## 2013-06-20 ENCOUNTER — Encounter: Payer: Self-pay | Admitting: *Deleted

## 2013-06-20 NOTE — Progress Notes (Signed)
Received faxed from Levada Dy at [facility] requesting paperwork to be filled out for [facility].  Placed in Dawn's box to complete.

## 2013-06-21 ENCOUNTER — Encounter (HOSPITAL_COMMUNITY): Payer: Self-pay

## 2013-06-21 ENCOUNTER — Encounter: Payer: Self-pay | Admitting: *Deleted

## 2013-06-21 NOTE — Progress Notes (Signed)
Received Oncotype Dx results of 18.  Emailed Dr. Jana Hakim with results.  Gave copy to MD & took a copy to Med Rec to scan.

## 2013-06-22 ENCOUNTER — Other Ambulatory Visit: Payer: Self-pay | Admitting: Family Medicine

## 2013-06-24 ENCOUNTER — Telehealth: Payer: Self-pay | Admitting: *Deleted

## 2013-06-24 NOTE — Telephone Encounter (Signed)
Faxed PAC and office notes to Levada Dy at [facility].  Took PAC to HIM to scan.

## 2013-06-24 NOTE — Telephone Encounter (Signed)
Med filled and letter mailed for pt to schedule an appt.  

## 2013-06-26 ENCOUNTER — Telehealth: Payer: Self-pay | Admitting: *Deleted

## 2013-06-26 NOTE — Telephone Encounter (Signed)
PA for Armour Thyroid 30mg  tabs approved until 04/24/2014. Approval letter sent to be scanned. JG//CMA

## 2013-06-26 NOTE — Telephone Encounter (Signed)
Prior authorization paperwork submitted for Armour Thyroid 30mg  tabs. Awaiting response. JG//CMA

## 2013-06-27 ENCOUNTER — Encounter (INDEPENDENT_AMBULATORY_CARE_PROVIDER_SITE_OTHER): Payer: Self-pay | Admitting: General Surgery

## 2013-06-27 ENCOUNTER — Ambulatory Visit (INDEPENDENT_AMBULATORY_CARE_PROVIDER_SITE_OTHER): Payer: Medicare PPO | Admitting: General Surgery

## 2013-06-27 ENCOUNTER — Telehealth: Payer: Self-pay | Admitting: Family Medicine

## 2013-06-27 VITALS — BP 122/78 | HR 75 | Temp 99.4°F | Resp 16 | Ht 64.0 in | Wt 166.0 lb

## 2013-06-27 DIAGNOSIS — C50411 Malignant neoplasm of upper-outer quadrant of right female breast: Secondary | ICD-10-CM

## 2013-06-27 DIAGNOSIS — C50419 Malignant neoplasm of upper-outer quadrant of unspecified female breast: Secondary | ICD-10-CM

## 2013-06-27 NOTE — Telephone Encounter (Signed)
Called patient and left a message for her to call our office to schedule an office visit apt with Dr Birdie Riddle to refill any further medications.

## 2013-06-27 NOTE — Progress Notes (Signed)
Patient ID: Monica Diaz, female   DOB: [birth year], 73 y.o.   MRN: [id number] History:  This patient underwent right partial mastectomy and sentinel node biopsy on 05/28/2013. 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 ER-positive 99%, PR-positive 50%, HER-2-negative, Ki-67  91%. Oncotype-DX is 18. Stage TII, N0, stage IIA. She has no pain or wound problems. No arm numbness or swelling. PET scan was performed. She asked about the report. It shows no evidence of metastatic disease. Sternal nodules thought to be benign hemangioma.  Exam: Patient is well. No distress. Right breast incision, upper outer quadrant and right axillary  incision are healing normally. Normal amount of thickening. No infection or significant hematoma. No arm swelling or sensory deficit. Full range of motion.  Assessment: Invasive carcinoma right breast, upper outer quadrant, receptor positive, HER-2-negative, stage T2, N0  (IIA).  Plan: I discussed her pathology report and Oncotype DX and PET/CT with her. See Dr. Jana Hakim next week and discuss pathology and Oncotype DX score Make appt. with Dr. Lisbeth Renshaw to discuss radiation oncology planning Diet and activities discussed Return to see me in 5 months   Monica Diaz, M.D., [facility] General and Minimally invasive Surgery Breast and Colorectal Surgery Office:   [phone] Pager:   [phone]

## 2013-06-27 NOTE — Addendum Note (Signed)
Addended by: Myer Peer on: 06/27/2013 04:26 PM   Modules accepted: Orders

## 2013-06-27 NOTE — Patient Instructions (Signed)
Your right breast and right axillary incisions are healing normally. There is no sign of infection or any complication. The thickening will slowly resolve over the next few months.  You may resume normal physical activities without restriction  We have reviewed your pathology report and your PET scan today. Be sure to discuss this in more detail when you see Dr. Jana Hakim next week.  You also need to make an appointment with Dr. Kyung Rudd, the radiation oncologist.  Return to see Dr. Dalbert Batman in 5 months, sooner if there are problems.

## 2013-07-01 NOTE — Progress Notes (Signed)
Location of Breast Cancer:Upper Outer Right Breast   Histology per Pathology Report: Diagnosis 05/14/13:Breast, right, needle core biopsy, 10 o'clock, 5 cm from nipple- INVASIVE DUCTAL CARCINOMA WITH EXTRACELLULAR MUCIN.- HIGH GRADE DUCTAL CARCINOMA IN SITU WITH NECROSIS.  Receptor Status: ER(+), PR (+), Her2-neu (   - )  Did patient present with symptoms (if so, please note symptoms) or was this found on screening mammography?: found on screening.states PCP was vigilant about finding out what visible shadow was scar tissue versus cancer, subsequently having a needle biopsy.  Past/Anticipated interventions by surgeon, if [facility]:[id number] 05/28/13:1. Breast, lumpectomy, Right- INVASIVE DUCTAL CARCINOMA WITH EXTRACELLULAR MUCIN, GRADE II/III, SPANNING 2.4 CM. 1 of 4 FINAL for Monica Diaz, Monica Diaz ([id number])Diagnosis(continued)- DUCTAL CARCINOMA IN SITU, INTERMEDIATE GRADE.- LOBULAR NEOPLASIA (ATYPICAL LOBULAR HYPERPLASIA). - THE SURGICAL RESECTION MARGINS ARE NEGATIVE FOR CARCINOMA.- SEE ONCOLOGY TABLE BELOW.2. Lymph node, sentinel, biopsy, Right axillary #1- THERE IS NO EVIDENCE OF CARCINOMA IN 1 OF 1 LYMPH NODE (0/1).3. Lymph node, sentinel, biopsy, Right axillary #2- THERE IS NO EVIDENCE OF CARCINOMA IN 1 OF 1 LYMPH NODE (0/1).4. Fatty tissue, Additional right axillary - THERE IS NO EVIDENCE OF CARCINOMA IN 1 OF 1 LYMPH NODE Dr.Haywood Ingram seen f/u 06/27/13 follow up 5 months   Past/Anticipated interventions by medical oncology, if any: Chemotherapy, seen in Breast clinic 05/23/13 ,  Dr. Jana Hakim appt 07/04/13   Lymphedema issues, if any:No Pain issues, if any: No  SAFETY ISSUES:no  Prior radiation? No  Pacemaker/ICD? No  Possible current pregnancy?No  Is the patient on methotrexate? 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     Monica Eaton, RN 07/01/2013,4:02 PM

## 2013-07-03 ENCOUNTER — Encounter: Payer: Self-pay | Admitting: Family Medicine

## 2013-07-03 ENCOUNTER — Ambulatory Visit (INDEPENDENT_AMBULATORY_CARE_PROVIDER_SITE_OTHER): Payer: Medicare PPO | Admitting: Family Medicine

## 2013-07-03 ENCOUNTER — Encounter (INDEPENDENT_AMBULATORY_CARE_PROVIDER_SITE_OTHER): Payer: Self-pay

## 2013-07-03 ENCOUNTER — Ambulatory Visit
Admission: RE | Admit: 2013-07-03 | Discharge: 2013-07-03 | Disposition: A | Discharge status: Home / Self Care | Payer: Medicare PPO | Source: Ambulatory Visit | Attending: Radiation Oncology | Admitting: Radiation Oncology

## 2013-07-03 ENCOUNTER — Encounter: Payer: Self-pay | Admitting: Radiation Oncology

## 2013-07-03 VITALS — BP 120/76 | HR 74 | Temp 98.4°F | Resp 16 | Wt 164.5 lb

## 2013-07-03 VITALS — BP 113/69 | HR 81 | Temp 98.1°F | Resp 16 | Ht 64.0 in | Wt 165.4 lb

## 2013-07-03 DIAGNOSIS — C50411 Malignant neoplasm of upper-outer quadrant of right female breast: Secondary | ICD-10-CM

## 2013-07-03 DIAGNOSIS — Z79899 Other long term (current) drug therapy: Secondary | ICD-10-CM | POA: Insufficient documentation

## 2013-07-03 DIAGNOSIS — E559 Vitamin D deficiency, unspecified: Secondary | ICD-10-CM

## 2013-07-03 DIAGNOSIS — C50919 Malignant neoplasm of unspecified site of unspecified female breast: Secondary | ICD-10-CM | POA: Insufficient documentation

## 2013-07-03 DIAGNOSIS — E039 Hypothyroidism, unspecified: Secondary | ICD-10-CM

## 2013-07-03 DIAGNOSIS — E785 Hyperlipidemia, unspecified: Secondary | ICD-10-CM

## 2013-07-03 NOTE — Assessment & Plan Note (Signed)
Chronic problem.  Currently asymptomatic.  Check labs.  Adjust meds prn  

## 2013-07-03 NOTE — Progress Notes (Signed)
   Subjective:    Patient ID: KENDYLL HUETTNER, female    DOB: 30-Jan-1941, 73 y.o.   MRN: [id number]  HPI Hyperlipidemia- chronic problem, on Crestor.  Denies abd pain, N/V, myalgias.  Hypothyroid- chronic problem, on Armour Thyroid.  Denies excessive fatigue, changes to skin/hair/nails, or constipation.  Vit D deficiency- chronic problem, on Vit D supplement daily.  Hx of osteoporosis.  Due for labs.   Review of Systems For ROS see HPI     Objective:   Physical Exam  Vitals reviewed. Constitutional: She is oriented to person, place, and time. She appears well-developed and well-nourished. No distress.  HENT:  Head: Normocephalic and atraumatic.  Eyes: Conjunctivae and EOM are normal. Pupils are equal, round, and reactive to light.  Neck: Normal range of motion. Neck supple. No thyromegaly present.  Cardiovascular: Normal rate, regular rhythm, normal heart sounds and intact distal pulses.   No murmur heard. Pulmonary/Chest: Effort normal and breath sounds normal. No respiratory distress.  Abdominal: Soft. She exhibits no distension. There is no tenderness.  Musculoskeletal: She exhibits no edema.  Lymphadenopathy:    She has no cervical adenopathy.  Neurological: She is alert and oriented to person, place, and time.  Skin: Skin is warm and dry.  Psychiatric: She has a normal mood and affect. Her behavior is normal.          Assessment & Plan:

## 2013-07-03 NOTE — Progress Notes (Signed)
Please see the Nurse Progress Note in the MD Initial Consult Encounter for this patient. 

## 2013-07-03 NOTE — Assessment & Plan Note (Signed)
Chronic problem.  Due for labs.  Replete prn.

## 2013-07-03 NOTE — Progress Notes (Signed)
Radiation Oncology         [phone] ________________________________  Name: ANAELLE DUNTON MRN: [id number]  Date: 07/03/2013  DOB: 08/15/1940  Follow-Up Visit Note  CC: Annye Asa, MD  Adin Hector, MD  Diagnosis:   Invasive ductal carcinoma of the right breast, T2, N0, M0   Narrative:  The patient returns today for routine follow-up.  The patient was initially seen in breast clinic. She was felt to be a good candidate for breast conservation treatment and she proceeded with a for lumpectomy E. through Dr. Dalbert Batman on 05/28/2013. This revealed an invasive ductal carcinoma which measured 2.4 cm. This was a grade 2 tumor. 3 lymph nodes were examined, none demonstrating carcinoma. Receptor studies have indicated that the tumor is ER positive and PR positive. An Oncotype test was ordered and this returned with a recurrence score of 18.  The patient indicates that he has done well in terms of recovering from surgery. No unexpected pain and no sign of infection.                             ALLERGIES:  is allergic to codeine.  Meds: Current Outpatient Prescriptions  Medication Sig Dispense Refill  . alendronate (FOSAMAX) 70 MG tablet Take 1 tablet (70 mg total) by mouth every 7 (seven) days. Take with a full glass of water on an empty stomach.  30 tablet  1  . calcium citrate-vitamin D (CITRACAL+D) 315-200 MG-UNIT per tablet Take 1 tablet by mouth 2 (two) times daily.      . Coenzyme Q10 (COQ10) 200 MG CAPS Take 1 capsule by mouth at bedtime.      . CRESTOR 10 MG tablet TAKE 1 TABLET BY MOUTH EVERY DAY  30 tablet  0  . loratadine (CLARITIN) 10 MG tablet Take 10 mg by mouth daily as needed.       . mometasone (NASONEX) 50 MCG/ACT nasal spray Place 2 sprays into the nose daily as needed.      . Multiple Vitamins-Minerals (CENTRUM SILVER PO) Take 1 tablet by mouth.      Marland Kitchen omeprazole (PRILOSEC) 10 MG capsule Take 10 mg by mouth.      Marland Kitchen OVER THE COUNTER MEDICATION Mega Red  Capsules 1-2 per month      . OVER THE COUNTER MEDICATION Take 2 capsules by mouth daily. Collagen Renew Supplement      . thyroid (ARMOUR) 30 MG tablet Take 1 tablet (30 mg total) by mouth daily.  30 tablet  11  . vitamin B-12 (CYANOCOBALAMIN) 50 MCG tablet Take 50 mcg by mouth.       No current facility-administered medications for this encounter.    Physical Findings: The patient is in no acute distress. Patient is alert and oriented.  height is 5\' 4"  (1.626 m) and weight is 165 lb 6.4 oz (75.025 kg). Her oral temperature is 98.1 F (36.7 C). Her blood pressure is 113/69 and her pulse is 81. Her respiration is 16. .   The patient's surgical incision is healing well as is the axillary incision. No erythema.  Lab Findings: Lab Results  Component Value Date   WBC 4.7 05/22/2013   HGB 11.4* 05/22/2013   HCT 34.5* 05/22/2013   MCV 93.5 05/22/2013   PLT 227 05/22/2013     Radiographic Findings: Nm Pet Image Initial (pi) Skull Base To Thigh  06/04/2013   CLINICAL DATA:  Initial treatment strategy for  right-sided breast cancer. Upper outer quadrant. Evaluate right sternal lesion. 05/28/2013 partial right mastectomy with right axillary sentinel node biopsy.  EXAM: NUCLEAR MEDICINE PET SKULL BASE TO THIGH  FASTING BLOOD GLUCOSE:  Value: 89 mg/dl  TECHNIQUE: 9.2 mCi F-18 FDG was injected intravenously. CT data was obtained and used for attenuation correction and anatomic localization.  COMPARISON:  MR BREAST BILAT WO/W CM dated 05/20/2013  FINDINGS: NECK  No areas of abnormal hypermetabolism.  CHEST  Mild low-level hypermetabolism about the lateral right breast, postsurgical or biopsy related.  ABDOMEN/PELVIS  No areas of abnormal hypermetabolism.  SKELETON  No abnormal marrow activity. No hypermetabolism within the sternum, including at the site of right sided sternal body hyper enhancement on prior MRI.  CT IMAGES PERFORMED FOR ATTENUATION CORRECTION  No axillary adenopathy. Multiple foci of gas and  ill-defined fluid within the right breast. A right axillary fluid collection measures 3.5 cm on image 55. Separate right axillary gas also identified. Right breast skin thickening.  A blind-ending retrocolic structure is suspicious for a mildly enlarged appendix. This measures 1.0 cm on image 125. No surrounding inflammation. Hysterectomy. Probable bone island in the right inferior pubic ramus.  IMPRESSION: 1. Low-level hypermetabolism within the right breast with concurrent gas and ill-defined fluid collections. Likely postoperative in nature. 2. No evidence of hypermetabolic metastasis. 3. No PET correlate for the focus of hyper enhancement within the sternum. This suggests a benign entity such as a hemangioma. Recommend radiographic follow-up until clearing. 4. 1.0 cm cylindrical structure which is likely a noninflamed appendix. Presuming the patient is asymptomatic, this may be within normal variation. Correlate with right lower quadrant symptoms and recommend attention on followup.   Electronically Signed   By: Abigail Miyamoto M.D.   On: 06/04/2013 15:43    Impression:    The patient is status post a lumpectomy for a T2, N0, M0 invasive ductal carcinoma of the right breast with negative margins. The patient is being seen by medical oncology later this week. Her recurrence score was 18.  I discussed with her my recommendation for adjuvant radiation treatment at the appropriate time. We discussed the benefit of such a treatment as well as the possible side effects and risks. All of her questions were answered.  Plan: We discussed tentatively scheduling the patient for a simulation within the next week. If this needs to be postponed based on her upcoming appointment with medical oncology then this can be canceled. I would anticipate at the appropriate time a 4 week course of hyperfractionated adjuvant radiation treatment.   Jodelle Gross, M.D., Ph.D.

## 2013-07-03 NOTE — Assessment & Plan Note (Signed)
Chronic problem.  Tolerating statin w/o difficulty.  Check labs.  Adjust meds prn  

## 2013-07-03 NOTE — Progress Notes (Signed)
Pre visit review using our clinic review tool, if applicable. No additional management support is needed unless otherwise documented below in the visit note. 

## 2013-07-03 NOTE — Patient Instructions (Signed)
Schedule your complete physical in 6 months We'll notify you of your lab results and make any changes if needed Keep up the good work!  You look great! Call with any questions or concerns Happy Spring!!! 

## 2013-07-03 NOTE — Addendum Note (Signed)
Encounter addended by: [name], MD on: 07/03/2013 10:24 PM<BR>     Documentation filed: Visit Diagnoses, Notes Section, Problem List, Follow-up Section, LOS Section

## 2013-07-04 ENCOUNTER — Ambulatory Visit (HOSPITAL_BASED_OUTPATIENT_CLINIC_OR_DEPARTMENT_OTHER): Payer: Medicare PPO | Admitting: Oncology

## 2013-07-04 ENCOUNTER — Encounter: Payer: Self-pay | Admitting: General Practice

## 2013-07-04 ENCOUNTER — Encounter: Payer: Self-pay | Admitting: *Deleted

## 2013-07-04 VITALS — BP 121/76 | HR 76 | Temp 98.0°F | Resp 18 | Ht 64.0 in | Wt 167.7 lb

## 2013-07-04 DIAGNOSIS — C50411 Malignant neoplasm of upper-outer quadrant of right female breast: Secondary | ICD-10-CM

## 2013-07-04 DIAGNOSIS — G4733 Obstructive sleep apnea (adult) (pediatric): Secondary | ICD-10-CM

## 2013-07-04 DIAGNOSIS — Z170 Estrogen receptor positive status [ER+]: Secondary | ICD-10-CM

## 2013-07-04 DIAGNOSIS — C50419 Malignant neoplasm of upper-outer quadrant of unspecified female breast: Secondary | ICD-10-CM

## 2013-07-04 DIAGNOSIS — M899 Disorder of bone, unspecified: Secondary | ICD-10-CM

## 2013-07-04 DIAGNOSIS — E039 Hypothyroidism, unspecified: Secondary | ICD-10-CM

## 2013-07-04 DIAGNOSIS — M949 Disorder of cartilage, unspecified: Secondary | ICD-10-CM

## 2013-07-04 LAB — BASIC METABOLIC PANEL
BUN: 9 mg/dL (ref 6–23)
CHLORIDE: 108 meq/L (ref 96–112)
CO2: 27 mEq/L (ref 19–32)
CREATININE: 1 mg/dL (ref 0.4–1.2)
Calcium: 9.4 mg/dL (ref 8.4–10.5)
GFR: 69.47 mL/min (ref >60.00)
Glucose, Bld: 70 mg/dL (ref 70–99)
Potassium: 3.9 mEq/L (ref 3.5–5.1)
SODIUM: 141 meq/L (ref 135–145)

## 2013-07-04 LAB — HEPATIC FUNCTION PANEL
ALBUMIN: 4.2 g/dL (ref 3.5–5.2)
ALT: 17 U/L (ref 0–35)
AST: 21 U/L (ref 0–37)
Alkaline Phosphatase: 36 U/L — ABNORMAL LOW (ref 39–117)
Bilirubin, Direct: 0 mg/dL (ref 0.0–0.3)
Total Bilirubin: 0.6 mg/dL (ref 0.3–1.2)
Total Protein: 7.2 g/dL (ref 6.0–8.3)

## 2013-07-04 LAB — LIPID PANEL
Cholesterol: 186 mg/dL (ref 0–200)
HDL: 70.6 mg/dL (ref >39.00)
LDL Cholesterol: 104 mg/dL — ABNORMAL HIGH (ref 0–99)
TRIGLYCERIDES: 57 mg/dL (ref 0.0–149.0)
Total CHOL/HDL Ratio: 3
VLDL: 11.4 mg/dL (ref 0.0–40.0)

## 2013-07-04 LAB — TSH: TSH: 0.63 u[IU]/mL (ref 0.35–5.50)

## 2013-07-04 NOTE — Progress Notes (Signed)
[facility]  [phone] Fax:([phone]) [phone]     ID: ITZAMARA CASAS OB: 12-29-1940  MR#: [id number]  AUQ#:[id number]  PCP: Annye Asa, MD GYN:   SU: [name] OTHER MD: Jaquita Folds Dohmier, [name]  CHIEF COMPLAINT: "What I have to remove?"  BREAST CANCER HISTORY:  Mailyn had bilateral screening mammography at the breast center 04/15/2013. This showed a possible mass in the right breast. Diagnostic right mammography and ultrasonography 05/09/2013 showed an irregular area of focal asymmetry in the right breast, which was not palpable. Ultrasound showed dystrophic calcification close to the area of asymmetry. The area of abnormality measured approximately 1.4 cm.  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 HER-2 was reported as negative during the multidisciplinary breast cancer conference 05/22/2013. The written report is pending.   On 05/20/2013 the patient underwent bilateral breast MRI which showed a 2.3 cm irregular enhancing mass in the right breast at the 12:00 position. There were no other areas of concern in either breast and there were no abnormal appearing lymph nodes. However incidentally a 6 mm enhancing right paramedian sternal lesion was noted.  The patient's subsequent history is as detailed below   INTERVAL HISTORY: Tashari returns today for discussion of her breast cancer accompanied by her daughter Patrecia Pour (the "h" is silent; the name means "hope".). Since her last visit here the patient had a right lumpectomy and sentinel lymph node sampling. This was on 05/28/2013. The final pathology ([id number]) showed an invasive ductal carcinoma with extracellular mucin, grade 2, measuring 2.2 cm.  Margins were negative. All 3 sentinel lymph nodes were clear. Repeat HER-2 was again negative.  REVIEW OF SYSTEMS: The patient tolerated the surgery well, without unusual pain, fever, or any bleeding. She has some seasonal allergies and sinus problems, but otherwise a detailed review of systems today was entirely negative. PAST MEDICAL HISTORY: Past Medical History  Diagnosis Date  . Hyperlipidemia   . Osteoporosis   . Thyroid disease   . Wears glasses   . HOH (hard of hearing)   . Sleep apnea     uses a cpap  . Arthritis   . Cancer     breast  . Seasonal allergies     PAST SURGICAL HISTORY: Past Surgical History  Procedure Laterality Date  . Abdominal hysterectomy      fibroids  . Left oophorectomy    . Colonoscopy    . Partial mastectomy with needle localization and axillary sentinel lymph node bx Right 05/28/2013    Procedure: PARTIAL MASTECTOMY WITH NEEDLE LOCALIZATION AND AXILLARY SENTINEL LYMPH NODE BIOPSY;  Surgeon: Adin Hector, MD;  Location: [facility];  Service: General;  Laterality: Right;    FAMILY HISTORY Family History  Problem Relation Age of Onset  . Hypertension Mother   . Diabetes Maternal Aunt   . Stroke      grandfather  The patient knows little about her father. Her mother is living, 93 years old as of January 2015. The patient was an only child. There is no history of breast or ovarian cancer in the family  GYNECOLOGIC HISTORY:  Menarche age 46, first live birth age 55, the patient is [place] P2. 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 She is divorced and lives alone with [name], who are two nonverbal parakeets. Her daughter [name] is a housewife in [place]. Her daughter Florella Mcneese is a college professor in [facility] [facility],  teaching urban education. The patient's niece [name] MD is a neurologist working at [place]: Not in place. The patient was given the appropriate documents at the time of her first visit here 05/22/2013 to complete and notarize at her discretion. She intends to name her daughter [name] as her healthcare power of attorney. She can be reached at [phone]   HEALTH MAINTENANCE: History  Substance Use Topics  . Smoking status: Never Smoker   . Smokeless tobacco: Not on file  . Alcohol Use: Yes     Comment: occasionally     Colonoscopy:  PAP:  Bone density:04/20/2012 showed mild osteopenia with a T score of -1.3  Lipid panel:  Allergies  Allergen Reactions  . Codeine     REACTION: sick to stomach    Current Outpatient Prescriptions  Medication Sig Dispense Refill  . alendronate (FOSAMAX) 70 MG tablet Take 1 tablet (70 mg total) by mouth every 7 (seven) days. Take with a full glass of water on an empty stomach.  30 tablet  1  . calcium citrate-vitamin D (CITRACAL+D) 315-200 MG-UNIT per tablet Take 1 tablet by mouth 2 (two) times daily.      . Coenzyme Q10 (COQ10) 200 MG CAPS Take 1 capsule by mouth at bedtime.      . CRESTOR 10 MG tablet TAKE 1 TABLET BY MOUTH EVERY DAY  30 tablet  0  . loratadine (CLARITIN) 10 MG tablet Take 10 mg by mouth daily as needed.       . mometasone (NASONEX) 50 MCG/ACT nasal spray Place 2 sprays into the nose daily as needed.      . Multiple Vitamins-Minerals (CENTRUM SILVER PO) Take 1 tablet by mouth.      Marland Kitchen omeprazole (PRILOSEC) 10 MG capsule Take 10 mg by mouth.      Marland Kitchen OVER THE COUNTER MEDICATION Mega Red Capsules 1-2 per month      . OVER THE COUNTER MEDICATION Take 2 capsules by mouth daily. Collagen Renew Supplement      . thyroid (ARMOUR) 30 MG tablet Take 1 tablet (30 mg total) by mouth daily.  30 tablet  11  . vitamin B-12 (CYANOCOBALAMIN) 50 MCG tablet Take 50 mcg by mouth.       No  current facility-administered medications for this visit.    OBJECTIVE: Middle-aged [place] American woman who appears stated age 73 Vitals:   07/04/13 1610  BP: 121/76  Pulse: 76  Temp: 98 F (36.7 C)  Resp: 18     Body mass index is 28.77 kg/(m^2).    ECOG FS:1 - Symptomatic but completely ambulatory  Ocular: Sclerae unicteric, pupils round and equal Ear-nose-throat: Oropharynx clear and moist Lymphatic: No cervical or supraclavicular adenopathy Lungs no rales or rhonchi, good excursion bilaterally Heart regular rate and rhythm, no murmur appreciated Abd soft, nontender, positive bowel sounds MSK no focal spinal tenderness, no joint edema, no palpable sternal mass and no sternal tenderness  Neuro: non-focal, well-oriented, pleasant affect Breasts: The right breast is status post recent lumpectomy. The incision is healing nicely. There is no dehiscence or erythema. There is some induration beneath the scar. The  right axilla is benign. The left breast is unremarkable.   LAB RESULTS:  CMP     Component Value Date/Time   NA 141 07/03/2013 1356   NA 141 05/22/2013 1227   K 3.9 07/03/2013 1356   K 4.0 05/22/2013 1227   CL 108 07/03/2013 1356   CO2 27 07/03/2013 1356   CO2 26 05/22/2013 1227   GLUCOSE 70 07/03/2013 1356   GLUCOSE 106 05/22/2013 1227   BUN 9 07/03/2013 1356   BUN 10.8 05/22/2013 1227   CREATININE 1.0 07/03/2013 1356   CREATININE 1.1 05/22/2013 1227   CALCIUM 9.4 07/03/2013 1356   CALCIUM 9.6 05/22/2013 1227   PROT 7.2 07/03/2013 1356   PROT 7.1 05/22/2013 1227   ALBUMIN 4.2 07/03/2013 1356   ALBUMIN 3.9 05/22/2013 1227   AST 21 07/03/2013 1356   AST 17 05/22/2013 1227   ALT 17 07/03/2013 1356   ALT 13 05/22/2013 1227   ALKPHOS 36* 07/03/2013 1356   ALKPHOS 42 05/22/2013 1227   BILITOT 0.6 07/03/2013 1356   BILITOT 0.39 05/22/2013 1227   GFRNONAA 66.33 03/25/2010 0828    I No results found for this basename: SPEP,  UPEP,   kappa and lambda light chains    Lab Results    Component Value Date   WBC 4.7 05/22/2013   NEUTROABS 2.0 05/22/2013   HGB 11.4* 05/22/2013   HCT 34.5* 05/22/2013   MCV 93.5 05/22/2013   PLT 227 05/22/2013      Chemistry      Component Value Date/Time   NA 141 07/03/2013 1356   NA 141 05/22/2013 1227   K 3.9 07/03/2013 1356   K 4.0 05/22/2013 1227   CL 108 07/03/2013 1356   CO2 27 07/03/2013 1356   CO2 26 05/22/2013 1227   BUN 9 07/03/2013 1356   BUN 10.8 05/22/2013 1227   CREATININE 1.0 07/03/2013 1356   CREATININE 1.1 05/22/2013 1227      Component Value Date/Time   CALCIUM 9.4 07/03/2013 1356   CALCIUM 9.6 05/22/2013 1227   ALKPHOS 36* 07/03/2013 1356   ALKPHOS 42 05/22/2013 1227   AST 21 07/03/2013 1356   AST 17 05/22/2013 1227   ALT 17 07/03/2013 1356   ALT 13 05/22/2013 1227   BILITOT 0.6 07/03/2013 1356   BILITOT 0.39 05/22/2013 1227       No results found for this basename: LABCA2    No components found with this basename: LABCA125    No results found for this basename: INR,  in the last 168 hours  Urinalysis No results found for this basename: colorurine,  appearanceur,  labspec,  phurine,  glucoseu,  hgbur,  bilirubinur,  ketonesur,  proteinur,  urobilinogen,  nitrite,  leukocytesur    STNUCLEAR MEDICINE PET SKULL BASE TO THIGH  FASTING BLOOD GLUCOSE: Value: 89 mg/dl  TECHNIQUE:  9.2 mCi F-18 FDG was injected intravenously. CT data was obtained  and used for attenuation correction and anatomic localization.  COMPARISON: MR BREAST BILAT WO/W CM dated 05/20/2013  FINDINGS:  NECK  No areas of abnormal hypermetabolism.  CHEST  Mild low-level hypermetabolism about the lateral right breast,  postsurgical or biopsy related.  ABDOMEN/PELVIS  No areas of abnormal hypermetabolism.  SKELETON  No abnormal marrow activity. No hypermetabolism within the sternum,  including at the site of right sided sternal body hyper enhancement  on prior MRI.  CT IMAGES PERFORMED FOR ATTENUATION CORRECTION  No axillary adenopathy.  Multiple foci of gas and ill-defined fluid  within the right breast. A right axillary fluid collection measures  3.5 cm on image 55. Separate right axillary gas also identified.  Right breast skin thickening.  A blind-ending retrocolic structure is suspicious for a mildly  enlarged appendix. This measures 1.0 cm on image 125. No surrounding  inflammation. Hysterectomy. Probable bone island in the right  inferior pubic ramus.  IMPRESSION:  1. Low-level hypermetabolism within the right breast with concurrent  gas and ill-defined fluid collections. Likely postoperative in  nature.  2. No evidence of hypermetabolic metastasis.  3. No PET correlate for the focus of hyper enhancement within the  sternum. This suggests a benign entity such as a hemangioma.  Recommend radiographic follow-up until clearing.  4. 1.0 cm cylindrical structure which is likely a noninflamed  appendix. Presuming the patient is asymptomatic, this may be within  normal variation. Correlate with right lower quadrant symptoms and  recommend attention on followup.  UDIES:   ASSESSMENT: 73 y.o. [place] woman status post right breast biopsy 05/14/2013 for a clinical T2 N0, stage IIA invasive ductal carcinoma, grade 2 or 3, estrogen receptor 99% positive, progesterone receptor 50% positive, with an MIB-1 of 91%, and no HER-2 amplification  (1) 6 mm enhancing right sternal lesion noted by breast MRI 05/20/2013-- negative on PET 06/04/2013  (2) status post right lumpectomy and sentinel lymph node sampling to 07/12/2013 for a pT2 pN0, stage IIA invasive ductal carcinoma, mucinous, grade 2, with repeat HER-2 again negative.  (3) Oncotype score of 18 predicts a 10 year risk of outside the breast recurrence of 12% if the patient's only systemic treatment is tamoxifen for 5 years. It also predicts a minimal benefit from chemotherapy.  PLAN: We spent approximately 45 minutes today going over the patient's pathology and Oncotype  findings. She understands her tumor is stage II, as we originally expected. The Oncotype predicts a risk of recurrence outside of the breast of 12% if her only systemic treatment was tamoxifen for 5 years. Adding aggressive chemotherapy might further lower the risk between 0 and 3%. I do not recommended.  On the other hand we have better options than 5 years of tamoxifen, namely 10 years of tamoxifen, 5 years of an aromatase inhibitor, or some combination of an aromatase inhibitor and tamoxifen for 5-10 years. We did discuss the side effects, toxicities and complications of these agents in a very preliminary fashion today. Primarily we went over the sequence of treatments and the patient understands that since she will not receive chemotherapy, next up his radiation.  My expectation is that she will be done with radiation after approximately 2 months. Accordingly she will see me again late May of this year. Since she is status post hysterectomy and has some osteopenia (though minimal) we will likely start on tamoxifen at that time.  The patient is a good understanding of the overall plan. She agrees with it. She knows the goal of treatment is cure. She will call with any problems that may develop before her next visit here. Chauncey Cruel, MD   07/04/2013 4:57 PM

## 2013-07-04 NOTE — [facility] Oncology Navigator Note (Signed)
Patient at [facility] for follow up visit with Dr. Jana Hakim.  Patient accompanied by her daughter from [place].  Patient reports that she is doing very well and has no complications from her surgery.  She is eager to discuss the next steps of her treatment with Dr. Jana Hakim and anxious to get started.

## 2013-07-04 NOTE — [facility] Oncology Navigator Note (Signed)
Patient at [facility] for appointment with Dr. Jana Hakim.  Patient accompanied by her daughter from [place].  Patient reports that she is doing very well and has experienced no complications since her surgery.  She says that she is eager to discuss her next steps in her treatment plan and to begin.  She denies any questions or concerns at this time.  I gave her my contact information and encouraged her to call me for any questions or needs.  Patient verbalized understanding.

## 2013-07-08 ENCOUNTER — Encounter: Payer: Self-pay | Admitting: General Practice

## 2013-07-08 ENCOUNTER — Telehealth: Payer: Self-pay | Admitting: Oncology

## 2013-07-08 LAB — VITAMIN D 1,25 DIHYDROXY
VITAMIN D 1, 25 (OH) TOTAL: 73 pg/mL — AB (ref 18–72)
VITAMIN D3 1, 25 (OH): 73 pg/mL
Vitamin D2 1, 25 (OH)2: <8 pg/mL

## 2013-07-08 NOTE — Telephone Encounter (Signed)
, °

## 2013-07-10 ENCOUNTER — Ambulatory Visit
Admission: RE | Admit: 2013-07-10 | Discharge: 2013-07-10 | Disposition: A | Discharge status: Home / Self Care | Payer: Medicare PPO | Source: Ambulatory Visit | Attending: Radiation Oncology | Admitting: Radiation Oncology

## 2013-07-10 DIAGNOSIS — C50411 Malignant neoplasm of upper-outer quadrant of right female breast: Secondary | ICD-10-CM

## 2013-07-10 DIAGNOSIS — Z79899 Other long term (current) drug therapy: Secondary | ICD-10-CM | POA: Insufficient documentation

## 2013-07-10 DIAGNOSIS — C50419 Malignant neoplasm of upper-outer quadrant of unspecified female breast: Secondary | ICD-10-CM | POA: Insufficient documentation

## 2013-07-10 DIAGNOSIS — Z510 Encounter for antineoplastic radiation therapy: Secondary | ICD-10-CM | POA: Insufficient documentation

## 2013-07-10 NOTE — Telephone Encounter (Signed)
Pt came by to change her appt...td

## 2013-07-12 NOTE — Progress Notes (Signed)
  Radiation Oncology         [phone] ________________________________  Name: LIAT MAYOL MRN: [id number]  Date: 07/10/2013  DOB: November 30, 1940  SIMULATION AND TREATMENT PLANNING NOTE  The patient presented for simulation prior to beginning her course of radiation treatment for her diagnosis of right-sided breast cancer. The patient was placed in a supine position on a breast board. A customized accuform device was also constructed and this complex treatment device will be used on a daily basis during her treatment. In this fashion, a CT scan was obtained through the chest area and an isocenter was placed near the chest wall within the right breast.  The patient will be planned to receive a course of radiation initially to a dose of 42.5 gray. This will consist of a whole breast radiotherapy technique. To accomplish this, 2 customized blocks have been designed which will correspond to medial and lateral whole breast tangent fields. This treatment will be accomplished at 1.8 gray per fraction. A complex isodose plan is requested to ensure that the breast target area is adequately covered dosimetrically. A forward planning technique will also be evaluated to determine if this approach improves the plan. It is anticipated that the patient will then receive a 7.5 gray boost to the seroma cavity which has been contoured. This will be accomplished at 2 gray per fraction. The final anticipated total dose therefore will correspond to 50 gray.  This treatment will consist of a 3-D conformal technique. The dose volume histograms are ordered for the seroma cavity, lungs, heart. A forward planning and reduced field technique is ordered to reduce heterogeneity within the target tissue.   _______________________________   Jodelle Gross, MD, PhD

## 2013-07-17 ENCOUNTER — Ambulatory Visit
Admission: RE | Admit: 2013-07-17 | Discharge: 2013-07-17 | Disposition: A | Discharge status: Home / Self Care | Payer: Medicare PPO | Source: Ambulatory Visit | Attending: Radiation Oncology | Admitting: Radiation Oncology

## 2013-07-17 DIAGNOSIS — C50411 Malignant neoplasm of upper-outer quadrant of right female breast: Secondary | ICD-10-CM

## 2013-07-18 ENCOUNTER — Ambulatory Visit
Admission: RE | Admit: 2013-07-18 | Discharge: 2013-07-18 | Disposition: A | Discharge status: Home / Self Care | Payer: Medicare PPO | Source: Ambulatory Visit | Attending: Radiation Oncology | Admitting: Radiation Oncology

## 2013-07-18 DIAGNOSIS — C50411 Malignant neoplasm of upper-outer quadrant of right female breast: Secondary | ICD-10-CM

## 2013-07-18 MED ORDER — ALRA NON-METALLIC DEODORANT (RAD-ONC)
1.0000 | Freq: Once | TOPICAL | Status: AC
Start: 2013-07-18 — End: 2013-07-18
  Administered 2013-07-18: 1 via TOPICAL

## 2013-07-18 MED ORDER — RADIAPLEXRX EX GEL
Freq: Once | CUTANEOUS | Status: AC
  Administered 2013-07-18: 10:00:00 via TOPICAL

## 2013-07-18 NOTE — Progress Notes (Signed)
Patient education done, radiation therapy and you book, my business card, printed calender, fler on skin products, alra, and radiaplex gel given,discussed skin irritation, fatigue, diet,pain, increase protein in doet, stay hydrated, teach back given, all questions answered 9:56 AM

## 2013-07-18 NOTE — Addendum Note (Signed)
Encounter addended by: Rebecca Eaton, RN on: 07/18/2013  9:57 AM<BR>     Documentation filed: Inpatient MAR

## 2013-07-19 ENCOUNTER — Ambulatory Visit
Admission: RE | Admit: 2013-07-19 | Discharge: 2013-07-19 | Disposition: A | Discharge status: Home / Self Care | Payer: Medicare PPO | Source: Ambulatory Visit | Attending: Radiation Oncology | Admitting: Radiation Oncology

## 2013-07-19 VITALS — BP 122/84 | HR 71 | Temp 97.7°F | Wt 166.5 lb

## 2013-07-19 DIAGNOSIS — C50411 Malignant neoplasm of upper-outer quadrant of right female breast: Secondary | ICD-10-CM

## 2013-07-19 NOTE — Progress Notes (Signed)
Patient for weekly assessment of radiation to right [employer].ch 2 of 17 treatments.No skin changes.Denies pain.reinforced skin care.

## 2013-07-19 NOTE — Progress Notes (Signed)
   Department of Radiation Oncology  Phone:  [phone] Fax:        [phone]  Weekly Treatment Note    Name: Monica Diaz Date: 07/19/2013 MRN: [id number] DOB: 1940/06/15   Current dose: 5 Gy  Current fraction: 2   MEDICATIONS: Current Outpatient Prescriptions  Medication Sig Dispense Refill  . alendronate (FOSAMAX) 70 MG tablet Take 1 tablet (70 mg total) by mouth every 7 (seven) days. Take with a full glass of water on an empty stomach.  30 tablet  1  . calcium citrate-vitamin D (CITRACAL+D) 315-200 MG-UNIT per tablet Take 1 tablet by mouth 2 (two) times daily.      . Coenzyme Q10 (COQ10) 200 MG CAPS Take 1 capsule by mouth at bedtime.      . CRESTOR 10 MG tablet TAKE 1 TABLET BY MOUTH EVERY DAY  30 tablet  0  . hyaluronate sodium (RADIAPLEXRX) GEL Apply 1 application topically 2 (two) times daily. Apply to affected site after rad tx and at bedtime daily      . loratadine (CLARITIN) 10 MG tablet Take 10 mg by mouth daily as needed.       . mometasone (NASONEX) 50 MCG/ACT nasal spray Place 2 sprays into the nose daily as needed.      . Multiple Vitamins-Minerals (CENTRUM SILVER PO) Take 1 tablet by mouth.      . non-metallic deodorant [name]) MISC Apply 1 application topically daily as needed. Apply after rad tyx daily and prn      . omeprazole (PRILOSEC) 10 MG capsule Take 10 mg by mouth.      Marland Kitchen OVER THE COUNTER MEDICATION Mega Red Capsules 1-2 per month      . OVER THE COUNTER MEDICATION Take 2 capsules by mouth daily. Collagen Renew Supplement      . thyroid (ARMOUR) 30 MG tablet Take 1 tablet (30 mg total) by mouth daily.  30 tablet  11  . vitamin B-12 (CYANOCOBALAMIN) 50 MCG tablet Take 50 mcg by mouth.       No current facility-administered medications for this encounter.     ALLERGIES: Codeine   LABORATORY DATA:  Lab Results  Component Value Date   WBC 4.7 05/22/2013   HGB 11.4* 05/22/2013   HCT 34.5* 05/22/2013   MCV 93.5 05/22/2013   PLT 227  05/22/2013   Lab Results  Component Value Date   NA 141 07/03/2013   K 3.9 07/03/2013   CL 108 07/03/2013   CO2 27 07/03/2013   Lab Results  Component Value Date   ALT 17 07/03/2013   AST 21 07/03/2013   ALKPHOS 36* 07/03/2013   BILITOT 0.6 07/03/2013     NARRATIVE: Monica Diaz was seen today for weekly treatment management. The chart was checked and the patient's films were reviewed. The patient is doing very well. No difficulties and 4 and her first week of treatment.  PHYSICAL EXAMINATION: weight is 166 lb 8 oz (75.524 kg). Her temperature is 97.7 F (36.5 C). Her blood pressure is 122/84 and her pulse is 71.       ASSESSMENT: The patient is doing satisfactorily with treatment.  PLAN: We will continue with the patient's radiation treatment as planned.

## 2013-07-22 ENCOUNTER — Ambulatory Visit
Admission: RE | Admit: 2013-07-22 | Discharge: 2013-07-22 | Disposition: A | Discharge status: Home / Self Care | Payer: Medicare PPO | Source: Ambulatory Visit | Attending: Radiation Oncology | Admitting: Radiation Oncology

## 2013-07-23 ENCOUNTER — Ambulatory Visit
Admission: RE | Admit: 2013-07-23 | Discharge: 2013-07-23 | Disposition: A | Discharge status: Home / Self Care | Payer: Medicare PPO | Source: Ambulatory Visit | Attending: Radiation Oncology | Admitting: Radiation Oncology

## 2013-07-24 ENCOUNTER — Ambulatory Visit
Admission: RE | Admit: 2013-07-24 | Discharge: 2013-07-24 | Disposition: A | Discharge status: Home / Self Care | Payer: Medicare PPO | Source: Ambulatory Visit | Attending: Radiation Oncology | Admitting: Radiation Oncology

## 2013-07-24 DIAGNOSIS — C50411 Malignant neoplasm of upper-outer quadrant of right female breast: Secondary | ICD-10-CM

## 2013-07-24 NOTE — Progress Notes (Signed)
Weekly rad txs  Rt breast 5 completed, slight erythema on breast skin intact, radiaplex bid, good appetite no pain 9:44 AM

## 2013-07-24 NOTE — Progress Notes (Signed)
   Weekly Management Note:  outpatient Current Dose:  12.5 Gy  Projected Dose: 42.5 Gy  initial  Narrative:  The patient presents for routine under treatment assessment.  CBCT/MVCT images/Port film x-rays were reviewed.  The chart was checked. Doing well  Physical Findings: Vitals - 1 value per visit 07/29/2861  SYSTOLIC 817  DIASTOLIC 85  Pulse 75  Temperature 97.6  Respirations 20  Weight (lb) 166.9  Height   BMI 28.63  VISIT REPORT      right breast - slight hyperpigmentation  Impression:  The patient is tolerating radiotherapy.  Plan:  Continue radiotherapy as planned. Continue radiaplex.  ________________________________   Eppie Gibson, M.D.

## 2013-07-25 ENCOUNTER — Ambulatory Visit
Admission: RE | Admit: 2013-07-25 | Discharge: 2013-07-25 | Disposition: A | Discharge status: Home / Self Care | Payer: Medicare PPO | Source: Ambulatory Visit | Attending: Radiation Oncology | Admitting: Radiation Oncology

## 2013-07-26 ENCOUNTER — Telehealth: Payer: Self-pay | Admitting: *Deleted

## 2013-07-26 ENCOUNTER — Ambulatory Visit: Payer: Medicare Other | Admitting: Nurse Practitioner

## 2013-07-26 ENCOUNTER — Ambulatory Visit
Admission: RE | Admit: 2013-07-26 | Discharge: 2013-07-26 | Disposition: A | Discharge status: Home / Self Care | Payer: Medicare PPO | Source: Ambulatory Visit | Attending: Radiation Oncology | Admitting: Radiation Oncology

## 2013-07-26 NOTE — Telephone Encounter (Signed)
Mailed after appt letter to pt. 

## 2013-07-27 ENCOUNTER — Other Ambulatory Visit: Payer: Self-pay | Admitting: Family Medicine

## 2013-07-29 ENCOUNTER — Ambulatory Visit
Admission: RE | Admit: 2013-07-29 | Discharge: 2013-07-29 | Disposition: A | Discharge status: Home / Self Care | Payer: Medicare PPO | Source: Ambulatory Visit | Attending: Radiation Oncology | Admitting: Radiation Oncology

## 2013-07-29 NOTE — Telephone Encounter (Signed)
Med filled.  

## 2013-07-30 ENCOUNTER — Ambulatory Visit
Admission: RE | Admit: 2013-07-30 | Discharge: 2013-07-30 | Disposition: A | Discharge status: Home / Self Care | Payer: Medicare PPO | Source: Ambulatory Visit | Attending: Radiation Oncology | Admitting: Radiation Oncology

## 2013-07-31 ENCOUNTER — Ambulatory Visit
Admission: RE | Admit: 2013-07-31 | Discharge: 2013-07-31 | Disposition: A | Discharge status: Home / Self Care | Payer: Medicare PPO | Source: Ambulatory Visit | Attending: Radiation Oncology | Admitting: Radiation Oncology

## 2013-07-31 ENCOUNTER — Encounter: Payer: Self-pay | Admitting: Radiation Oncology

## 2013-07-31 VITALS — BP 114/80 | HR 68 | Temp 97.5°F | Resp 20

## 2013-07-31 DIAGNOSIS — C50411 Malignant neoplasm of upper-outer quadrant of right female breast: Secondary | ICD-10-CM

## 2013-07-31 NOTE — Progress Notes (Signed)
Weekly rad txs RT breast 10 completed, erythema only, skin intact, does have sensitivity and slight tingling around nipple area,, appetite good, no fatigue 9:49 AM

## 2013-07-31 NOTE — Progress Notes (Signed)
   Department of Radiation Oncology  Phone:  [phone] Fax:        [phone]  Weekly Treatment Note    Name: Monica Diaz Date: 07/31/2013 MRN: [id number] DOB: [birth year]    Current fraction: 10   MEDICATIONS: Current Outpatient Prescriptions  Medication Sig Dispense Refill  . alendronate (FOSAMAX) 70 MG tablet Take 1 tablet (70 mg total) by mouth every 7 (seven) days. Take with a full glass of water on an empty stomach.  30 tablet  1  . ARMOUR THYROID 30 MG tablet TAKE 1 TABLET BY MOUTH DAILY  90 tablet  0  . calcium citrate-vitamin D (CITRACAL+D) 315-200 MG-UNIT per tablet Take 1 tablet by mouth 2 (two) times daily.      . Coenzyme Q10 (COQ10) 200 MG CAPS Take 1 capsule by mouth at bedtime.      . CRESTOR 10 MG tablet TAKE 1 TABLET BY MOUTH EVERY DAY  30 tablet  0  . hyaluronate sodium (RADIAPLEXRX) GEL Apply 1 application topically 2 (two) times daily. Apply to affected site after rad tx and at bedtime daily      . loratadine (CLARITIN) 10 MG tablet Take 10 mg by mouth daily as needed.       . mometasone (NASONEX) 50 MCG/ACT nasal spray Place 2 sprays into the nose daily as needed.      . Multiple Vitamins-Minerals (CENTRUM SILVER PO) Take 1 tablet by mouth.      . non-metallic deodorant [name]) MISC Apply 1 application topically daily as needed. Apply after rad tyx daily and prn      . omeprazole (PRILOSEC) 10 MG capsule Take 10 mg by mouth.      Marland Kitchen OVER THE COUNTER MEDICATION Mega Red Capsules 1-2 per month      . OVER THE COUNTER MEDICATION Take 2 capsules by mouth daily. Collagen Renew Supplement      . vitamin B-12 (CYANOCOBALAMIN) 50 MCG tablet Take 50 mcg by mouth.       No current facility-administered medications for this encounter.     ALLERGIES: Codeine   LABORATORY DATA:  Lab Results  Component Value Date   WBC 4.7 05/22/2013   HGB 11.4* 05/22/2013   HCT 34.5* 05/22/2013   MCV 93.5 05/22/2013   PLT 227 05/22/2013   Lab Results  Component Value  Date   NA 141 07/03/2013   K 3.9 07/03/2013   CL 108 07/03/2013   CO2 27 07/03/2013   Lab Results  Component Value Date   ALT 17 07/03/2013   AST 21 07/03/2013   ALKPHOS 36* 07/03/2013   BILITOT 0.6 07/03/2013     NARRATIVE: Monica Diaz was seen today for weekly treatment management. The chart was checked and the patient's films were reviewed. The patient is doing well. No major changes in terms of skin irritation. She is using skin cream daily.  PHYSICAL EXAMINATION: Alert, in no acute distress, in good spirits     ASSESSMENT: The patient is doing satisfactorily with treatment.  PLAN: We will continue with the patient's radiation treatment as planned.

## 2013-08-01 ENCOUNTER — Ambulatory Visit
Admission: RE | Admit: 2013-08-01 | Discharge: 2013-08-01 | Disposition: A | Discharge status: Home / Self Care | Payer: Medicare PPO | Source: Ambulatory Visit | Attending: Radiation Oncology | Admitting: Radiation Oncology

## 2013-08-02 ENCOUNTER — Encounter: Payer: Self-pay | Admitting: Radiation Oncology

## 2013-08-02 ENCOUNTER — Ambulatory Visit
Admission: RE | Admit: 2013-08-02 | Discharge: 2013-08-02 | Disposition: A | Discharge status: Home / Self Care | Payer: Medicare PPO | Source: Ambulatory Visit | Attending: Radiation Oncology | Admitting: Radiation Oncology

## 2013-08-02 VITALS — BP 117/83 | HR 74 | Temp 98.4°F | Resp 20 | Wt 167.6 lb

## 2013-08-02 DIAGNOSIS — C50411 Malignant neoplasm of upper-outer quadrant of right female breast: Secondary | ICD-10-CM

## 2013-08-02 NOTE — Progress Notes (Signed)
   Department of Radiation Oncology  Phone:  [phone] Fax:        [phone]  Weekly Treatment Note    Name: Monica Diaz Date: 08/02/2013 MRN: [id number] DOB: 1940-08-02   Current dose: 30 Gy  Current fraction: 12   MEDICATIONS: Current Outpatient Prescriptions  Medication Sig Dispense Refill  . alendronate (FOSAMAX) 70 MG tablet Take 1 tablet (70 mg total) by mouth every 7 (seven) days. Take with a full glass of water on an empty stomach.  30 tablet  1  . ARMOUR THYROID 30 MG tablet TAKE 1 TABLET BY MOUTH DAILY  90 tablet  0  . calcium citrate-vitamin D (CITRACAL+D) 315-200 MG-UNIT per tablet Take 1 tablet by mouth 2 (two) times daily.      . Coenzyme Q10 (COQ10) 200 MG CAPS Take 1 capsule by mouth at bedtime.      . CRESTOR 10 MG tablet TAKE 1 TABLET BY MOUTH EVERY DAY  30 tablet  0  . hyaluronate sodium (RADIAPLEXRX) GEL Apply 1 application topically 2 (two) times daily. Apply to affected site after rad tx and at bedtime daily      . loratadine (CLARITIN) 10 MG tablet Take 10 mg by mouth daily as needed.       . mometasone (NASONEX) 50 MCG/ACT nasal spray Place 2 sprays into the nose daily as needed.      . Multiple Vitamins-Minerals (CENTRUM SILVER PO) Take 1 tablet by mouth.      . non-metallic deodorant [name]) MISC Apply 1 application topically daily as needed. Apply after rad tyx daily and prn      . omeprazole (PRILOSEC) 10 MG capsule Take 10 mg by mouth.      Marland Kitchen OVER THE COUNTER MEDICATION Mega Red Capsules 1-2 per month      . OVER THE COUNTER MEDICATION Take 2 capsules by mouth daily. Collagen Renew Supplement      . vitamin B-12 (CYANOCOBALAMIN) 50 MCG tablet Take 50 mcg by mouth.       No current facility-administered medications for this encounter.     ALLERGIES: Codeine   LABORATORY DATA:  Lab Results  Component Value Date   WBC 4.7 05/22/2013   HGB 11.4* 05/22/2013   HCT 34.5* 05/22/2013   MCV 93.5 05/22/2013   PLT 227 05/22/2013   Lab  Results  Component Value Date   NA 141 07/03/2013   K 3.9 07/03/2013   CL 108 07/03/2013   CO2 27 07/03/2013   Lab Results  Component Value Date   ALT 17 07/03/2013   AST 21 07/03/2013   ALKPHOS 36* 07/03/2013   BILITOT 0.6 07/03/2013     NARRATIVE: Monica Diaz was seen today for weekly treatment management. The chart was checked and the patient's films were reviewed. The patient is doing very well. No new complaints. She does complain of slight fatigue. Appetite.  PHYSICAL EXAMINATION: weight is 167 lb 9.6 oz (76.023 kg). Her oral temperature is 98.4 F (36.9 C). Her blood pressure is 117/83 and her pulse is 74. Her respiration is 20.      hyperpigmentation in the treatment area. No desquamation.  ASSESSMENT: The patient is doing satisfactorily with treatment.  PLAN: We will continue with the patient's radiation treatment as planned.

## 2013-08-02 NOTE — Progress Notes (Signed)
wekly rad txs rt breast, erythmea, skin intact, using radiaplex bid, walked 3 miles at gym yesterdy after rad tx, came home was little fatigued,good appetite, 9:40 AM

## 2013-08-05 ENCOUNTER — Ambulatory Visit
Admission: RE | Admit: 2013-08-05 | Discharge: 2013-08-05 | Disposition: A | Discharge status: Home / Self Care | Payer: Medicare PPO | Source: Ambulatory Visit | Attending: Radiation Oncology | Admitting: Radiation Oncology

## 2013-08-06 ENCOUNTER — Ambulatory Visit
Admission: RE | Admit: 2013-08-06 | Discharge: 2013-08-06 | Disposition: A | Discharge status: Home / Self Care | Payer: Medicare PPO | Source: Ambulatory Visit | Attending: Radiation Oncology | Admitting: Radiation Oncology

## 2013-08-07 ENCOUNTER — Encounter: Payer: Self-pay | Admitting: Radiation Oncology

## 2013-08-07 ENCOUNTER — Ambulatory Visit
Admission: RE | Admit: 2013-08-07 | Discharge: 2013-08-07 | Disposition: A | Discharge status: Home / Self Care | Payer: Medicare PPO | Source: Ambulatory Visit | Attending: Radiation Oncology | Admitting: Radiation Oncology

## 2013-08-08 ENCOUNTER — Ambulatory Visit
Admission: RE | Admit: 2013-08-08 | Discharge: 2013-08-08 | Disposition: A | Discharge status: Home / Self Care | Payer: Medicare PPO | Source: Ambulatory Visit | Attending: Radiation Oncology | Admitting: Radiation Oncology

## 2013-08-09 ENCOUNTER — Ambulatory Visit
Admission: RE | Admit: 2013-08-09 | Discharge: 2013-08-09 | Disposition: A | Discharge status: Home / Self Care | Payer: Medicare PPO | Source: Ambulatory Visit | Attending: Radiation Oncology | Admitting: Radiation Oncology

## 2013-08-09 DIAGNOSIS — C50411 Malignant neoplasm of upper-outer quadrant of right female breast: Secondary | ICD-10-CM

## 2013-08-09 MED ORDER — RADIAPLEXRX EX GEL
Freq: Once | CUTANEOUS | Status: AC
  Administered 2013-08-09: 10:00:00 via TOPICAL

## 2013-08-09 NOTE — Addendum Note (Signed)
Encounter addended by: Rebecca Eaton, RN on: 08/09/2013 10:16 AM<BR>     Documentation filed: Inpatient MAR

## 2013-08-09 NOTE — Progress Notes (Signed)
Patient een in back on linac#3 machine, by MD, not sent to nursing for assessment, gave radiaplex tube per MD 9:39 AM

## 2013-08-09 NOTE — Progress Notes (Signed)
   Department of Radiation Oncology  Phone:  [phone] Fax:        [phone]  Weekly Treatment Note    Name: Monica Diaz Date: 08/09/2013 MRN: [id number] DOB: 1940/11/14   Current dose: 42.5 Gy  Current fraction: 17   MEDICATIONS: Current Outpatient Prescriptions  Medication Sig Dispense Refill  . alendronate (FOSAMAX) 70 MG tablet Take 1 tablet (70 mg total) by mouth every 7 (seven) days. Take with a full glass of water on an empty stomach.  30 tablet  1  . ARMOUR THYROID 30 MG tablet TAKE 1 TABLET BY MOUTH DAILY  90 tablet  0  . calcium citrate-vitamin D (CITRACAL+D) 315-200 MG-UNIT per tablet Take 1 tablet by mouth 2 (two) times daily.      . Coenzyme Q10 (COQ10) 200 MG CAPS Take 1 capsule by mouth at bedtime.      . CRESTOR 10 MG tablet TAKE 1 TABLET BY MOUTH EVERY DAY  30 tablet  0  . hyaluronate sodium (RADIAPLEXRX) GEL Apply 1 application topically 2 (two) times daily. Apply to affected site after rad tx and at bedtime daily      . loratadine (CLARITIN) 10 MG tablet Take 10 mg by mouth daily as needed.       . mometasone (NASONEX) 50 MCG/ACT nasal spray Place 2 sprays into the nose daily as needed.      . Multiple Vitamins-Minerals (CENTRUM SILVER PO) Take 1 tablet by mouth.      . non-metallic deodorant [name]) MISC Apply 1 application topically daily as needed. Apply after rad tyx daily and prn      . omeprazole (PRILOSEC) 10 MG capsule Take 10 mg by mouth.      Marland Kitchen OVER THE COUNTER MEDICATION Mega Red Capsules 1-2 per month      . OVER THE COUNTER MEDICATION Take 2 capsules by mouth daily. Collagen Renew Supplement      . vitamin B-12 (CYANOCOBALAMIN) 50 MCG tablet Take 50 mcg by mouth.       Current Facility-Administered Medications  Medication Dose Route Frequency Provider Last Rate Last Dose  . hyaluronate sodium (RADIAPLEXRX) gel   Topical Once [name], MD         ALLERGIES: Codeine   LABORATORY DATA:  Lab Results  Component Value Date     WBC 4.7 05/22/2013   HGB 11.4* 05/22/2013   HCT 34.5* 05/22/2013   MCV 93.5 05/22/2013   PLT 227 05/22/2013   Lab Results  Component Value Date   NA 141 07/03/2013   K 3.9 07/03/2013   CL 108 07/03/2013   CO2 27 07/03/2013   Lab Results  Component Value Date   ALT 17 07/03/2013   AST 21 07/03/2013   ALKPHOS 36* 07/03/2013   BILITOT 0.6 07/03/2013     NARRATIVE: Monica Diaz was seen today for weekly treatment management. The chart was checked and the patient's films were reviewed. The patient is doing very well with treatment. She is going to begin her boost treatment on Monday. Her skin is holding up very well. No new complaints. She continues to use skin cream daily.  PHYSICAL EXAMINATION: vitals were not taken for this visit.     the patient's skin looks excellent with some hyperpigmentation.  ASSESSMENT: The patient is doing satisfactorily with treatment.  PLAN: We will continue with the patient's radiation treatment as planned.

## 2013-08-10 ENCOUNTER — Other Ambulatory Visit: Payer: Self-pay | Admitting: Family Medicine

## 2013-08-12 ENCOUNTER — Ambulatory Visit
Admission: RE | Admit: 2013-08-12 | Discharge: 2013-08-12 | Disposition: A | Discharge status: Home / Self Care | Payer: Medicare PPO | Source: Ambulatory Visit | Attending: Radiation Oncology | Admitting: Radiation Oncology

## 2013-08-12 NOTE — Telephone Encounter (Signed)
Med filled.  

## 2013-08-13 ENCOUNTER — Ambulatory Visit
Admission: RE | Admit: 2013-08-13 | Discharge: 2013-08-13 | Disposition: A | Discharge status: Home / Self Care | Payer: Medicare PPO | Source: Ambulatory Visit | Attending: Radiation Oncology | Admitting: Radiation Oncology

## 2013-08-14 ENCOUNTER — Ambulatory Visit
Admission: RE | Admit: 2013-08-14 | Discharge: 2013-08-14 | Disposition: A | Discharge status: Home / Self Care | Payer: Medicare PPO | Source: Ambulatory Visit | Attending: Radiation Oncology | Admitting: Radiation Oncology

## 2013-08-14 ENCOUNTER — Encounter: Payer: Self-pay | Admitting: Radiation Oncology

## 2013-08-14 NOTE — Progress Notes (Signed)
Patient for weekly and last radiation treatment today.completed 20 of 20 treatments to right breast.Denies pain.Skin with hyperpigmentation.continue application of radiaplex over the next 3 to 4 weeks. Know she may use lotion with vitamin e.Denies pain just increased sensitivity around the areola/nipple.No significant fatigue.One month follow up to be rescheduled to accommodate previous appointment.

## 2013-08-28 NOTE — Progress Notes (Signed)
  Radiation Oncology         [phone] ________________________________  Name: Monica Diaz MRN: [id number]  Date: 07/10/2013  DOB: 1940-10-20  Optical Surface Tracking Plan:  Since intensity modulated radiotherapy (IMRT) and 3D conformal radiation treatment methods are predicated on accurate and precise positioning for treatment, intrafraction motion monitoring is medically necessary to ensure accurate and safe treatment delivery.  The ability to quantify intrafraction motion without excessive ionizing radiation dose can only be performed with optical surface tracking. Accordingly, surface imaging offers the opportunity to obtain 3D measurements of patient position throughout IMRT and 3D treatments without excessive radiation exposure.  I am ordering optical surface tracking for this patient's upcoming course of radiotherapy. ________________________________  [name], MD 08/28/2013 9:36 PM    Reference:   Particia Jasper, et al. Surface imaging-based analysis of intrafraction motion for breast radiotherapy patients.Journal of [place], n. 6, nov. 2014. ISSN [id number].   Available at: <http://www.jacmp.org/index.php/jacmp/article/view/4957>.

## 2013-08-28 NOTE — Progress Notes (Signed)
  Radiation Oncology         [phone] ________________________________  Name: Monica Diaz MRN: [id number]  Date: 07/17/2013  DOB: Jul 12, 1940  Simulation Verification Note   NARRATIVE: The patient was brought to the treatment unit and placed in the planned treatment position. The clinical setup was verified. Then port films were obtained and uploaded to the radiation oncology medical record software.  The treatment beams were carefully compared against the planned radiation fields. The position, location, and shape of the radiation fields was reviewed. The targeted volume of tissue appears to be appropriately covered by the radiation beams. Based on my personal review, I approved the simulation verification. The patient's treatment will proceed as planned.  ________________________________   Jodelle Gross, MD, PhD

## 2013-08-28 NOTE — Progress Notes (Signed)
  Radiation Oncology         [phone] ________________________________  Name: Monica Diaz MRN: [id number]  Date: 08/14/2013  DOB: 1941-01-30  End of Treatment Note  Diagnosis:   Invasive ductal carcinoma of the right breast     Indication for treatment:  Curative       Radiation treatment dates:   07/18/2013 through 08/14/2013  Site/dose:   The patient was treated with whole breast radiation treatment to a dose of 42.5 gray in 17 fractions. This was accomplished using a 3-D conformal technique. The patient then received a 7.5 gray boost in 3 fractions using an en face electron field. The patient's total dose was 50 gray.  Narrative: The patient tolerated radiation treatment relatively well.   Some hyperpigmentation was present towards the end of treatment with some skin irritation/dryness.  Plan: The patient has completed radiation treatment. The patient will return to radiation oncology clinic for routine followup in one month. I advised the patient to call or return sooner if they have any questions or concerns related to their recovery or treatment. ________________________________  Jodelle Gross, M.D., Ph.D.

## 2013-08-28 NOTE — Progress Notes (Signed)
  Radiation Oncology         [phone] ________________________________  Name: Monica Diaz MRN: [id number]  Date: 08/07/2013  DOB: 18-Feb-1941  Complex simulation note  The patient has undergone complex simulation for her upcoming boost treatment for her diagnosis of breast cancer. The patient has initially been planned to receive 42.5 Gy. The patient will now receive a 7.5 Gy boost to the seroma cavity which has been contoured. This will be accomplished using an en face electron field. Based on the depth of the target area, 15 MeV electrons will be used and this field has been normalized to the 100 % isodose line. The patient's final total dose therefore will be 50 Gy. A special port plan is requested for the boost treatment.   _______________________________  Jodelle Gross, MD, PhD

## 2013-08-28 NOTE — Addendum Note (Signed)
Encounter addended by: [name], MD on: 08/28/2013  9:35 PM<BR>     Documentation filed: Notes Section, Visit Diagnoses

## 2013-08-28 NOTE — Addendum Note (Signed)
Encounter addended by: [name], MD on: 08/28/2013  9:36 PM<BR>     Documentation filed: Notes Section

## 2013-08-29 ENCOUNTER — Other Ambulatory Visit (HOSPITAL_BASED_OUTPATIENT_CLINIC_OR_DEPARTMENT_OTHER): Payer: Medicare PPO

## 2013-08-29 DIAGNOSIS — G4733 Obstructive sleep apnea (adult) (pediatric): Secondary | ICD-10-CM

## 2013-08-29 DIAGNOSIS — C50419 Malignant neoplasm of upper-outer quadrant of unspecified female breast: Secondary | ICD-10-CM

## 2013-08-29 DIAGNOSIS — C50411 Malignant neoplasm of upper-outer quadrant of right female breast: Secondary | ICD-10-CM

## 2013-08-29 LAB — COMPREHENSIVE METABOLIC PANEL (CC13)
ALBUMIN: 3.8 g/dL (ref 3.5–5.0)
ALK PHOS: 44 U/L (ref 40–150)
ALT: 14 U/L (ref 0–55)
AST: 23 U/L (ref 5–34)
Anion Gap: 8 mEq/L (ref 3–11)
BUN: 13.5 mg/dL (ref 7.0–26.0)
CO2: 23 mEq/L (ref 22–29)
Calcium: 9.7 mg/dL (ref 8.4–10.4)
Chloride: 109 mEq/L (ref 98–109)
Creatinine: 1.3 mg/dL — ABNORMAL HIGH (ref 0.6–1.1)
Glucose: 113 mg/dl (ref 70–140)
Potassium: 4 mEq/L (ref 3.5–5.1)
SODIUM: 141 meq/L (ref 136–145)
TOTAL PROTEIN: 7.3 g/dL (ref 6.4–8.3)
Total Bilirubin: 0.29 mg/dL (ref 0.20–1.20)

## 2013-08-29 LAB — CBC WITH DIFFERENTIAL/PLATELET
BASO%: 0.1 % (ref 0.0–2.0)
Basophils Absolute: 0 10*3/uL (ref 0.0–0.1)
EOS%: 2.2 % (ref 0.0–7.0)
Eosinophils Absolute: 0.1 10*3/uL (ref 0.0–0.5)
HCT: 36.4 % (ref 34.8–46.6)
HGB: 11.9 g/dL (ref 11.6–15.9)
LYMPH%: 27.6 % (ref 14.0–49.7)
MCH: 30.4 pg (ref 25.1–34.0)
MCHC: 32.7 g/dL (ref 31.5–36.0)
MCV: 92.7 fL (ref 79.5–101.0)
MONO#: 0.5 10*3/uL (ref 0.1–0.9)
MONO%: 13.1 % (ref 0.0–14.0)
NEUT#: 2.2 10*3/uL (ref 1.5–6.5)
NEUT%: 57 % (ref 38.4–76.8)
Platelets: 216 10*3/uL (ref 145–400)
RBC: 3.93 10*6/uL (ref 3.70–5.45)
RDW: 14.8 % — AB (ref 11.2–14.5)
WBC: 3.9 10*3/uL (ref 3.9–10.3)
lymph#: 1.1 10*3/uL (ref 0.9–3.3)

## 2013-08-30 ENCOUNTER — Ambulatory Visit: Payer: Medicare Other | Admitting: Neurology

## 2013-08-31 ENCOUNTER — Other Ambulatory Visit: Payer: Self-pay | Admitting: Family Medicine

## 2013-09-02 NOTE — Telephone Encounter (Signed)
Med filled.  

## 2013-09-05 ENCOUNTER — Ambulatory Visit: Payer: Medicare PPO | Admitting: Oncology

## 2013-09-05 ENCOUNTER — Ambulatory Visit (HOSPITAL_BASED_OUTPATIENT_CLINIC_OR_DEPARTMENT_OTHER): Payer: Medicare PPO | Admitting: Oncology

## 2013-09-05 ENCOUNTER — Telehealth: Payer: Self-pay | Admitting: Oncology

## 2013-09-05 ENCOUNTER — Encounter: Payer: Self-pay | Admitting: *Deleted

## 2013-09-05 VITALS — BP 131/72 | HR 87 | Temp 97.9°F | Resp 18 | Ht 64.0 in | Wt 166.1 lb

## 2013-09-05 DIAGNOSIS — C50411 Malignant neoplasm of upper-outer quadrant of right female breast: Secondary | ICD-10-CM

## 2013-09-05 DIAGNOSIS — C50419 Malignant neoplasm of upper-outer quadrant of unspecified female breast: Secondary | ICD-10-CM

## 2013-09-05 DIAGNOSIS — Z170 Estrogen receptor positive status [ER+]: Secondary | ICD-10-CM

## 2013-09-05 NOTE — [facility] Oncology Navigator Note (Signed)
Patient at [facility] for f/u appointment with Dr. Jana Hakim.  Patient reports that she is doing very well.  She reports that her surgical site has healed very well.  There is a small area of the incision beneath her arm that is a little sore and she feels has developed a small keloid.  She experiences an occasional shooting pain in her breast.  The sensitivity and soreness at her nipple has improved.  She does still feel some firmness above the nipple which feels is a little smaller than after surgery.  Patient had questions about diet and particularly soy products.  I obtained and gave patient a handout from the dietician about "Soy and Breast Cancer".  I also gave her information about [facility] including contact information for registering and forwarded her contact informtation so that she will receive the monthly calendar.  Patient denied any other questions or concerns at this time.  I reviewed her upcoming appointments and encouraged her to call me for any needs.

## 2013-09-05 NOTE — Progress Notes (Signed)
[place]  [phone] [phone]     ID: Monica Diaz OB: 1941/04/25  MR#: [id number]  MOL#:[id number]  PCP: Annye Asa, MD GYN:   SU: [name] OTHER MD: Jaquita Folds Dohmier, [name]  CHIEF COMPLAINT: "What I have to remove?"  BREAST CANCER HISTORY:  Monica Diaz had bilateral screening mammography at the breast center 04/15/2013. This showed a possible mass in the right breast. Diagnostic right mammography and ultrasonography 05/09/2013 showed an irregular area of focal asymmetry in the right breast, which was not palpable. Ultrasound showed dystrophic calcification close to the area of asymmetry. The area of abnormality measured approximately 1.4 cm.  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 HER-2 was reported as negative during the multidisciplinary breast cancer conference 05/22/2013. The written report is pending.   On 05/20/2013 the patient underwent bilateral breast MRI which showed a 2.3 cm irregular enhancing mass in the right breast at the 12:00 position. There were no other areas of concern in either breast and there were no abnormal appearing lymph nodes. However incidentally a 6 mm enhancing right paramedian sternal lesion was noted.  The patient's subsequent history is as detailed below   INTERVAL HISTORY: Monica Diaz returns today for discussion of her breast cancer. Since her last visit here she completed her radiation treatments. She did remarkably well, with minimal fatigue and minimal hyperpigmentation but no significant desquamation. 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 She  has mild sinus symptoms associated with the pollen, and a little bit of a dry cough goes with that. She has some soreness in the right breast area postsurgical he but this is not worse than before. She worries about the lumpiness of the right breast at present. She has some low back pain which is not more intense or frequent than before. A detailed review of systems was otherwise noncontributory  PAST MEDICAL HISTORY: Past Medical History  Diagnosis Date  . Hyperlipidemia   . Osteoporosis   . Thyroid disease   . Wears glasses   . HOH (hard of hearing)   . Sleep apnea     uses a cpap  . Arthritis   . Cancer     breast  . Seasonal allergies     PAST SURGICAL HISTORY: Past Surgical History  Procedure Laterality Date  . Abdominal hysterectomy      fibroids  . Left oophorectomy    . Colonoscopy    . Partial mastectomy with needle localization and axillary sentinel lymph node bx Right 05/28/2013    Procedure: PARTIAL MASTECTOMY WITH NEEDLE LOCALIZATION AND AXILLARY SENTINEL LYMPH NODE BIOPSY;  Surgeon: Adin Hector, MD;  Location: [facility];  Service: General;  Laterality: Right;    FAMILY HISTORY Family History  Problem Relation Age of Onset  . Hypertension Mother   . Diabetes Maternal Aunt   . Stroke      grandfather  The patient knows little about her father. Her mother is living, 55 years old as of January 2015. The patient was an only child. There is no history of breast or ovarian cancer in the family  GYNECOLOGIC HISTORY:  Menarche age 64, first  live birth age 60, the patient is [place] P2. 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 She is divorced and lives alone with [name], who are two nonverbal parakeets. Her daughter [name] is a housewife in [place]. Her  daughter Loreda Silverio is a college professor in [employer], teaching urban education. The patient's niece [name] MD is a neurologist working at [employer]: Not in place. The patient was given the appropriate documents at the time of her first visit here 05/22/2013 to complete and notarize at her discretion. She intends to name her daughter [name] as her healthcare power of attorney. She can be reached at [phone]   HEALTH MAINTENANCE: History  Substance Use Topics  . Smoking status: Never Smoker   . Smokeless tobacco: Not on file  . Alcohol Use: Yes     Comment: occasionally     Colonoscopy:  PAP:  Bone density:04/20/2012 showed mild osteopenia with a T score of -1.3  Lipid panel:  Allergies  Allergen Reactions  . Codeine     REACTION: sick to stomach    Current Outpatient Prescriptions  Medication Sig Dispense Refill  . alendronate (FOSAMAX) 70 MG tablet TAKE 1 TABLET BY MOUTH EVERY 7 DAYS WITH A FULL GLASS OF WATER ON AN EMPTY STOMACH  28 tablet  0  . ARMOUR THYROID 30 MG tablet TAKE 1 TABLET BY MOUTH DAILY  90 tablet  0  . calcium citrate-vitamin D (CITRACAL+D) 315-200 MG-UNIT per tablet Take 1 tablet by mouth 2 (two) times daily.      . Coenzyme Q10 (COQ10) 200 MG CAPS Take 1 capsule by mouth at bedtime.      . CRESTOR 10 MG tablet TAKE 1 TABLET BY MOUTH DAILY  30 tablet  6  . hyaluronate sodium (RADIAPLEXRX) GEL Apply 1 application topically 2 (two) times daily. Apply to affected site after rad tx and at bedtime daily      . loratadine (CLARITIN) 10 MG tablet Take 10 mg by mouth daily as needed.       . mometasone (NASONEX) 50 MCG/ACT nasal spray Place 2 sprays into the nose daily as needed.      . Multiple Vitamins-Minerals (CENTRUM SILVER PO) Take 1 tablet by mouth.      . non-metallic deodorant [name]) MISC Apply 1 application topically daily as needed. Apply after rad tyx daily and prn      .  omeprazole (PRILOSEC) 10 MG capsule Take 10 mg by mouth.      Marland Kitchen OVER THE COUNTER MEDICATION Mega Red Capsules 1-2 per month      . OVER THE COUNTER MEDICATION Take 2 capsules by mouth daily. Collagen Renew Supplement      . vitamin B-12 (CYANOCOBALAMIN) 50 MCG tablet Take 50 mcg by mouth.       No current facility-administered medications for this visit.    OBJECTIVE: Middle-aged [place] American woman in no acute distress Filed Vitals:   09/05/13 1401  BP: 131/72  Pulse: 87  Temp: 97.9 F (36.6 C)  Resp: 18     Body mass index is 28.5 kg/(m^2).    ECOG FS:0 - Asymptomatic  Ocular: Sclerae unicteric, EOMs intact Ear-nose-throat: Oropharynx clear, teeth in good repair Lymphatic: No cervical or supraclavicular adenopathy Lungs no rales or rhonchi Heart regular rate  and rhythm Abd soft, nontender, positive bowel sounds MSK no focal spinal tenderness, no upper extremity lymphedema Neuro: non-focal, well-oriented, appropriate affect Breasts: The right breast is status post lumpectomy and radiation. There is some induration beneath the scar. There is no erythema or evidence of dehiscence. The right axilla is benign.  LAB RESULTS:  CMP     Component Value Date/Time   NA 141 08/29/2013 1619   NA 141 07/03/2013 1356   K 4.0 08/29/2013 1619   K 3.9 07/03/2013 1356   CL 108 07/03/2013 1356   CO2 23 08/29/2013 1619   CO2 27 07/03/2013 1356   GLUCOSE 113 08/29/2013 1619   GLUCOSE 70 07/03/2013 1356   BUN 13.5 08/29/2013 1619   BUN 9 07/03/2013 1356   CREATININE 1.3* 08/29/2013 1619   CREATININE 1.0 07/03/2013 1356   CALCIUM 9.7 08/29/2013 1619   CALCIUM 9.4 07/03/2013 1356   PROT 7.3 08/29/2013 1619   PROT 7.2 07/03/2013 1356   ALBUMIN 3.8 08/29/2013 1619   ALBUMIN 4.2 07/03/2013 1356   AST 23 08/29/2013 1619   AST 21 07/03/2013 1356   ALT 14 08/29/2013 1619   ALT 17 07/03/2013 1356   ALKPHOS 44 08/29/2013 1619   ALKPHOS 36* 07/03/2013 1356   BILITOT 0.29 08/29/2013 1619   BILITOT 0.6 07/03/2013 1356    GFRNONAA 66.33 03/25/2010 0828    I No results found for this basename: SPEP,  UPEP,   kappa and lambda light chains    Lab Results  Component Value Date   WBC 3.9 08/29/2013   NEUTROABS 2.2 08/29/2013   HGB 11.9 08/29/2013   HCT 36.4 08/29/2013   MCV 92.7 08/29/2013   PLT 216 08/29/2013      Chemistry      Component Value Date/Time   NA 141 08/29/2013 1619   NA 141 07/03/2013 1356   K 4.0 08/29/2013 1619   K 3.9 07/03/2013 1356   CL 108 07/03/2013 1356   CO2 23 08/29/2013 1619   CO2 27 07/03/2013 1356   BUN 13.5 08/29/2013 1619   BUN 9 07/03/2013 1356   CREATININE 1.3* 08/29/2013 1619   CREATININE 1.0 07/03/2013 1356      Component Value Date/Time   CALCIUM 9.7 08/29/2013 1619   CALCIUM 9.4 07/03/2013 1356   ALKPHOS 44 08/29/2013 1619   ALKPHOS 36* 07/03/2013 1356   AST 23 08/29/2013 1619   AST 21 07/03/2013 1356   ALT 14 08/29/2013 1619   ALT 17 07/03/2013 1356   BILITOT 0.29 08/29/2013 1619   BILITOT 0.6 07/03/2013 1356       No results found for this basename: LABCA2    No components found with this basename: LABCA125    No results found for this basename: INR,  in the last 168 hours  Urinalysis No results found for this basename: colorurine,  appearanceur,  labspec,  phurine,  glucoseu,  hgbur,  bilirubinur,  ketonesur,  proteinur,  urobilinogen,  nitrite,  leukocytesur    STUDIES: No results found.   ASSESSMENT: 73 y.o. [place] woman status post right breast biopsy 05/14/2013 for a clinical T2 N0, stage IIA invasive ductal carcinoma, grade 2 or 3, estrogen receptor 99% positive, progesterone receptor 50% positive, with an MIB-1 of 91%, and no HER-2 amplification  (1) 6 mm enhancing right sternal lesion noted by breast MRI 05/20/2013-- negative on PET 06/04/2013  (2) status post right lumpectomy and sentinel lymph node sampling to 07/12/2013 for a pT2 pN0, stage IIA invasive ductal carcinoma,  mucinous, grade 2, with repeat HER-2 again negative.  (3) Oncotype score of 18 predicts a 10  year risk of outside the breast recurrence of 12% if the patient's only systemic treatment is tamoxifen for 5 years. It also predicts a minimal benefit from chemotherapy.  (4) adjuvant radiation completed 08/14/2013  (5) anastrozole started 09/06/2013  PLAN: Sheliah Hatch did very well with radiation and is now ready to start antiestrogen therapy. She understands this will further reduce the risk of local recurrence but more importantly cut in half the risk of distant recurrence and also the risk of a new breast cancer developing in either breast.  We had a long discussion today regarding the difference between an aromatase inhibitors and tamoxifen. After all labs she is clearly a "five-year planned" woman and she will like to start anastrozole. If she tolerates it well she will like to continue it for 5 years without going on tamoxifen.   On the other hand she is concerned about osteoporosis osteopenia. We're going to obtain a bone density before next visit here, and at that point decide whether she should receive week last or not.  She has a good understanding of the overall plan. She agrees with it. She knows the goal of treatment in her case is cure. She will call with any problems that develop before next visit here.     Chauncey Cruel, MD   09/05/2013 2:28 PM

## 2013-09-05 NOTE — Telephone Encounter (Signed)
, °

## 2013-09-18 ENCOUNTER — Ambulatory Visit: Payer: Medicare PPO | Attending: Oncology | Admitting: Physical Therapy

## 2013-09-18 DIAGNOSIS — C50919 Malignant neoplasm of unspecified site of unspecified female breast: Secondary | ICD-10-CM | POA: Insufficient documentation

## 2013-09-18 DIAGNOSIS — I890 Lymphedema, not elsewhere classified: Secondary | ICD-10-CM | POA: Insufficient documentation

## 2013-09-18 DIAGNOSIS — IMO0001 Reserved for inherently not codable concepts without codable children: Secondary | ICD-10-CM | POA: Insufficient documentation

## 2013-09-19 ENCOUNTER — Ambulatory Visit: Payer: Medicare PPO | Admitting: Radiation Oncology

## 2013-09-25 ENCOUNTER — Encounter: Payer: Self-pay | Admitting: Radiation Oncology

## 2013-09-26 ENCOUNTER — Telehealth: Payer: Self-pay | Admitting: *Deleted

## 2013-09-26 ENCOUNTER — Other Ambulatory Visit: Payer: Self-pay | Admitting: *Deleted

## 2013-09-26 ENCOUNTER — Encounter: Payer: Self-pay | Admitting: Radiation Oncology

## 2013-09-26 ENCOUNTER — Ambulatory Visit
Admission: RE | Admit: 2013-09-26 | Discharge: 2013-09-26 | Disposition: A | Discharge status: Home / Self Care | Payer: Medicare PPO | Source: Ambulatory Visit | Attending: Radiation Oncology | Admitting: Radiation Oncology

## 2013-09-26 VITALS — BP 114/77 | HR 81 | Temp 98.1°F | Resp 20 | Wt 166.8 lb

## 2013-09-26 DIAGNOSIS — C50411 Malignant neoplasm of upper-outer quadrant of right female breast: Secondary | ICD-10-CM

## 2013-09-26 HISTORY — DX: Personal history of irradiation: Z92.3

## 2013-09-26 MED ORDER — ANASTROZOLE 1 MG PO TABS: 1.0000 mg | ORAL_TABLET | Freq: Every day | ORAL | Status: DC

## 2013-09-26 NOTE — Progress Notes (Signed)
Radiation Oncology         [phone] ________________________________  Name: Monica Diaz MRN: [id number]  Date: 09/26/2013  DOB: Feb 04, 1941  Follow-Up Visit Note  CC: Annye Asa, MD  Adin Hector, MD  Diagnosis:   Right-sided breast cancer  Interval Since Last Radiation:  Approximately one month   Narrative:  The patient returns today for routine follow-up.  She has done well overall since she finished treatment. The patient's skin has healed significantly since she completed her course of radiation treatment. She has not begun anti-hormonal treatment.  The patient has been planned to receive an AI but she states that she hasn't been called to take this up yet. The nursing staff has called and will leave a message for medical oncology as per pharmacy does not have a prescription for this.  ALLERGIES:  is allergic to codeine.  Meds: Current Outpatient Prescriptions  Medication Sig Dispense Refill  . alendronate (FOSAMAX) 70 MG tablet TAKE 1 TABLET BY MOUTH EVERY 7 DAYS WITH A FULL GLASS OF WATER ON AN EMPTY STOMACH  28 tablet  0  . ARMOUR THYROID 30 MG tablet TAKE 1 TABLET BY MOUTH DAILY  90 tablet  0  . calcium citrate-vitamin D (CITRACAL+D) 315-200 MG-UNIT per tablet Take 1 tablet by mouth 2 (two) times daily.      . Coenzyme Q10 (COQ10) 200 MG CAPS Take 1 capsule by mouth at bedtime.      . CRESTOR 10 MG tablet TAKE 1 TABLET BY MOUTH DAILY  30 tablet  6  . loratadine (CLARITIN) 10 MG tablet Take 10 mg by mouth daily as needed.       . mometasone (NASONEX) 50 MCG/ACT nasal spray Place 2 sprays into the nose daily as needed.      . Multiple Vitamins-Minerals (CENTRUM SILVER PO) Take 1 tablet by mouth.      Marland Kitchen OVER THE COUNTER MEDICATION Mega Red Capsules 1-2 per month      . OVER THE COUNTER MEDICATION Take 2 capsules by mouth daily. Collagen Renew Supplement      . vitamin B-12 (CYANOCOBALAMIN) 50 MCG tablet Take 50 mcg by mouth.      Marland Kitchen omeprazole (PRILOSEC) 10 MG  capsule Take 10 mg by mouth.       No current facility-administered medications for this encounter.    Physical Findings: The patient is in no acute distress. Patient is alert and oriented.  weight is 166 lb 12.8 oz (75.66 kg). Her oral temperature is 98.1 F (36.7 C). Her blood pressure is 114/77 and her pulse is 81. Her respiration is 20. .   The skin in the treatment area has healed satisfactorily, no areas of concern/moist desquamation/poor healing. A seroma is present without any sign of infection.   Lab Findings: Lab Results  Component Value Date   WBC 3.9 08/29/2013   HGB 11.9 08/29/2013   HCT 36.4 08/29/2013   MCV 92.7 08/29/2013   PLT 216 08/29/2013     Radiographic Findings: No results found.  Impression:    The patient has done satisfactorily since finishing treatment. She has not begun anti-hormonal treatment.  Plan:  The patient will followup in our clinic on a when necessary basis.   Jodelle Gross, M.D., Ph.D.

## 2013-09-26 NOTE — Telephone Encounter (Signed)
Called [facility] [pharmacy asked if they had rx for Anastrozole from 09/06/13 from [place], no they don't have that rx, called Dr.Magrinat's nurse phone and left message for them to please follow up and call patient 4:01 PM

## 2013-09-26 NOTE — Progress Notes (Signed)
Follow up rad tx right breast txs 07/18/13-08/14/13, still some tanning, and tenderness on breast, skin intact,, ,patient is using gold bond lotion with b=vitamin e  On skin, there is a har knot in middle of right breast , she saw Dr.Magrinat 5/114/15, was to start anastrozole, patient hasn't been called to pick up medication, so I called [facility] ,they do not have that rx, will call Dr.Magrinat's nurse to follow up on whether patient to start the anastrozole, appetite good, energy level is fine,  Walked 3 miles yesterday at the gym, ,  3:52 PM

## 2013-09-26 NOTE — Telephone Encounter (Signed)
This RN was notified by Thayer Headings in [employer] of need for prescription for arimidex per Dr Jana Hakim. Noted as not on list nor does pt have a prescription for this drug.  Verified with MD and order placed to pt's pharmacy in chart.  Message left on pt's VM.

## 2013-09-30 ENCOUNTER — Ambulatory Visit (INDEPENDENT_AMBULATORY_CARE_PROVIDER_SITE_OTHER): Payer: Medicare PPO | Admitting: Internal Medicine

## 2013-09-30 ENCOUNTER — Encounter: Payer: Self-pay | Admitting: Internal Medicine

## 2013-09-30 VITALS — BP 117/76 | HR 74 | Temp 98.0°F | Wt 164.0 lb

## 2013-09-30 DIAGNOSIS — R079 Chest pain, unspecified: Secondary | ICD-10-CM | POA: Insufficient documentation

## 2013-09-30 MED ORDER — OMEPRAZOLE 40 MG PO CPDR: 40.0000 mg | DELAYED_RELEASE_CAPSULE | Freq: Every day | ORAL | Status: DC

## 2013-09-30 NOTE — Progress Notes (Signed)
Pre visit review using our clinic review tool, if applicable. No additional management support is needed unless otherwise documented below in the visit note. 

## 2013-09-30 NOTE — Patient Instructions (Signed)
HOLD Fosamax until you see Dr. Birdie Riddle  Start Prilosec 40 mg one tablet before breakfast daily   Get the XR at [facility], corner of [address] and [address] (10 minutes form here); they are open 24/7 [place], [place] [address] [phone]  If your symptoms are not gradually getting better or if you get worse let us know  See Dr. Birdie Riddle in 1 month

## 2013-09-30 NOTE — Progress Notes (Signed)
Subjective:    Patient ID: Monica Diaz, female    DOB: 1941-04-13, 73 y.o.   MRN: [id number]  DOS:  09/30/2013 Type of  Visit: Acute  History: Symptoms started 09/27/2013: Developed right-sided chest pain while she was eating, described as a deep ache, not stabbing.   That night, he got a little better after she drank some soda and worse after she ate peanutes. Since then she's having pain in the same location with almost every meal. Today for the first time she was able to eat a meal without symptoms. Symptoms are also increase when she takes a deep breath.  The morning of 09/27/2013 she had her regular Fosamax which she is taking for approximately 5 years.  ROS Denies difficulty breathing or lower extremity edema. Recently she flew to [place] and [place], came back 09/24/2013;  denies any calf pain. No  nausea, vomiting, diarrhea blood in the stools. No dysphagia, no classic heartburn. No rash in the chest She finished radiation therapy a few weeks ago  Past Medical History  Diagnosis Date  . Hyperlipidemia   . Osteoporosis   . Thyroid disease   . Wears glasses   . HOH (hard of hearing)   . Sleep apnea     uses a cpap  . Arthritis   . Cancer     breast  . Seasonal allergies   . Hx of radiation therapy 07/18/13-08/14/13    right breast total dose 50 Gy    Past Surgical History  Procedure Laterality Date  . Abdominal hysterectomy      fibroids  . Left oophorectomy    . Colonoscopy    . Partial mastectomy with needle localization and axillary sentinel lymph node bx Right 05/28/2013    Procedure: PARTIAL MASTECTOMY WITH NEEDLE LOCALIZATION AND AXILLARY SENTINEL LYMPH NODE BIOPSY;  Surgeon: Adin Hector, MD;  Location: [facility];  Service: General;  Laterality: Right;    History   Social History  . Marital Status: Divorced    Spouse Name: N/A    Number of Children: 2  . Years of Education: N/A   Occupational History  .     Social History  Main Topics  . Smoking status: Never Smoker   . Smokeless tobacco: Not on file  . Alcohol Use: Yes     Comment: occasionally  . Drug Use: No  . Sexual Activity: Not on file   Other Topics Concern  . Not on file   Social History Narrative  . No narrative on file        Medication List       This list is accurate as of: 09/30/13 11:59 PM.  Always use your most recent med list.               alendronate 70 MG tablet  Commonly known as:  FOSAMAX  HOLD     anastrozole 1 MG tablet  Commonly known as:  ARIMIDEX  Take 1 tablet (1 mg total) by mouth daily.     ARMOUR THYROID 30 MG tablet  Generic drug:  thyroid  TAKE 1 TABLET BY MOUTH DAILY     calcium citrate-vitamin D 315-200 MG-UNIT per tablet  Commonly known as:  CITRACAL+D  Take 1 tablet by mouth 2 (two) times daily.     CENTRUM SILVER PO  Take 1 tablet by mouth.     CoQ10 200 MG Caps  Take 1 capsule by mouth at bedtime.  CRESTOR 10 MG tablet  Generic drug:  rosuvastatin  TAKE 1 TABLET BY MOUTH DAILY     loratadine 10 MG tablet  Commonly known as:  CLARITIN  Take 10 mg by mouth daily as needed.     mometasone 50 MCG/ACT nasal spray  Commonly known as:  NASONEX  Place 2 sprays into the nose daily as needed.     omeprazole 40 MG capsule  Commonly known as:  PRILOSEC  Take 1 capsule (40 mg total) by mouth daily.     OVER THE COUNTER MEDICATION  Mega Red Capsules 1-2 per month     OVER THE COUNTER MEDICATION  Take 2 capsules by mouth daily. Collagen Renew Supplement     vitamin B-12 50 MCG tablet  Commonly known as:  CYANOCOBALAMIN  Take 50 mcg by mouth.           Objective:   Physical Exam BP 117/76  Pulse 74  Temp(Src) 98 F (36.7 C)  Wt 164 lb (74.39 kg)  SpO2 99%  General -- alert, well-developed, NAD.  Neck --no mass, no crepitus HEENT-- Not pale.  Lungs -- normal respiratory effort, no intercostal retractions, no accessory muscle use, and normal breath sounds.  Heart-- normal  rate, regular rhythm, no murmur.  Abdomen-- Not distended, good bowel sounds,soft, non-tender. Extremities-- no pretibial edema bilaterally ; calves not tender, symmetric Neurologic--  alert & oriented X3. Speech normal, gait appropriate for age, strength symmetric and appropriate for age.    Psych-- Cognition and judgment appear intact. Cooperative with normal attention span and concentration. No anxious or depressed appearing.       Assessment & Plan:   Chest pain 73 year old lady with history of breast cancer, status post radiation therapy to the right breast finished   April 2015, on Fosamax, status post few airplane flights who presents with right-sided chest pain, mostly triggered by eating, sometimes with deep breaths. Given the predominance of GI triggers, is very likely she has esophagitis due to Fosamax and less likely radiation esophagitis. Less likely etiology are GB dz, PE, CAD, shingles etc. Plan: Will treat her as if she has esophagitis----> HOLD  fosamax, Rx Prilosec, chest x-ray, see instructions.

## 2013-10-01 ENCOUNTER — Ambulatory Visit (HOSPITAL_BASED_OUTPATIENT_CLINIC_OR_DEPARTMENT_OTHER)
Admission: RE | Admit: 2013-10-01 | Discharge: 2013-10-01 | Disposition: A | Discharge status: Home / Self Care | Payer: Medicare PPO | Source: Ambulatory Visit | Attending: Internal Medicine | Admitting: Internal Medicine

## 2013-10-01 DIAGNOSIS — R079 Chest pain, unspecified: Secondary | ICD-10-CM | POA: Insufficient documentation

## 2013-10-01 DIAGNOSIS — Z923 Personal history of irradiation: Secondary | ICD-10-CM | POA: Insufficient documentation

## 2013-10-26 ENCOUNTER — Other Ambulatory Visit: Payer: Self-pay | Admitting: Family Medicine

## 2013-10-28 NOTE — Telephone Encounter (Signed)
Rx sent to the pharmacy by e-script.//AB/CMA 

## 2013-11-21 ENCOUNTER — Other Ambulatory Visit (HOSPITAL_BASED_OUTPATIENT_CLINIC_OR_DEPARTMENT_OTHER): Payer: Medicare PPO

## 2013-11-21 DIAGNOSIS — G4733 Obstructive sleep apnea (adult) (pediatric): Secondary | ICD-10-CM

## 2013-11-21 DIAGNOSIS — C50419 Malignant neoplasm of upper-outer quadrant of unspecified female breast: Secondary | ICD-10-CM

## 2013-11-21 DIAGNOSIS — C50411 Malignant neoplasm of upper-outer quadrant of right female breast: Secondary | ICD-10-CM

## 2013-11-21 LAB — CBC WITH DIFFERENTIAL/PLATELET
BASO%: 0.2 % (ref 0.0–2.0)
Basophils Absolute: 0 10*3/uL (ref 0.0–0.1)
EOS%: 1.2 % (ref 0.0–7.0)
Eosinophils Absolute: 0.1 10*3/uL (ref 0.0–0.5)
HCT: 37.1 % (ref 34.8–46.6)
HGB: 12.2 g/dL (ref 11.6–15.9)
LYMPH%: 31.3 % (ref 14.0–49.7)
MCH: 29.9 pg (ref 25.1–34.0)
MCHC: 32.9 g/dL (ref 31.5–36.0)
MCV: 90.9 fL (ref 79.5–101.0)
MONO#: 0.4 10*3/uL (ref 0.1–0.9)
MONO%: 10.5 % (ref 0.0–14.0)
NEUT#: 2.4 10*3/uL (ref 1.5–6.5)
NEUT%: 56.8 % (ref 38.4–76.8)
PLATELETS: 207 10*3/uL (ref 145–400)
RBC: 4.08 10*6/uL (ref 3.70–5.45)
RDW: 14.2 % (ref 11.2–14.5)
WBC: 4.2 10*3/uL (ref 3.9–10.3)
lymph#: 1.3 10*3/uL (ref 0.9–3.3)

## 2013-11-21 LAB — COMPREHENSIVE METABOLIC PANEL (CC13)
ALT: 25 U/L (ref 0–55)
AST: 32 U/L (ref 5–34)
Albumin: 3.9 g/dL (ref 3.5–5.0)
Alkaline Phosphatase: 53 U/L (ref 40–150)
Anion Gap: 8 mEq/L (ref 3–11)
BILIRUBIN TOTAL: 0.25 mg/dL (ref 0.20–1.20)
BUN: 10.7 mg/dL (ref 7.0–26.0)
CO2: 26 mEq/L (ref 22–29)
CREATININE: 1 mg/dL (ref 0.6–1.1)
Calcium: 9.5 mg/dL (ref 8.4–10.4)
Chloride: 107 mEq/L (ref 98–109)
GLUCOSE: 113 mg/dL (ref 70–140)
Potassium: 3.9 mEq/L (ref 3.5–5.1)
SODIUM: 141 meq/L (ref 136–145)
TOTAL PROTEIN: 7.6 g/dL (ref 6.4–8.3)

## 2013-11-22 ENCOUNTER — Encounter: Payer: Self-pay | Admitting: *Deleted

## 2013-11-22 NOTE — [facility] Oncology Navigator Note (Signed)
I called patient to check in.  She reports that she is doing well.  She continues to take anastrozole and is tolerating it well without any side effects.  Her only complaint is intermittent pain and weakness in her right wrist.  She has also observed that the blood vessels in both arms seem more prominent.  She has discussed these complaints with her PCP who she reports told her there was no concern. We discussed her upcoming appointments.  I encouraged her to call me for any questions or concerns.

## 2013-11-28 ENCOUNTER — Ambulatory Visit (HOSPITAL_BASED_OUTPATIENT_CLINIC_OR_DEPARTMENT_OTHER): Payer: Medicare PPO | Admitting: Oncology

## 2013-11-28 ENCOUNTER — Telehealth: Payer: Self-pay | Admitting: Oncology

## 2013-11-28 VITALS — BP 126/77 | HR 71 | Temp 98.2°F | Resp 18 | Ht 64.0 in | Wt 165.0 lb

## 2013-11-28 DIAGNOSIS — C50419 Malignant neoplasm of upper-outer quadrant of unspecified female breast: Secondary | ICD-10-CM

## 2013-11-28 DIAGNOSIS — C50411 Malignant neoplasm of upper-outer quadrant of right female breast: Secondary | ICD-10-CM

## 2013-11-28 NOTE — Telephone Encounter (Signed)
per pof to sch appt-sch & cld pt to adv pt of time & date-will mail copy of sch

## 2013-11-28 NOTE — Progress Notes (Signed)
[facility]  Telephone:(336) [phone] Fax:(336) [phone]     ID: BREIA OCAMPO OB: Jun 18, 1940  MR#: [id number]  YSA#:[id number]  PCP: Annye Asa, MD GYN:   SU: [name] OTHER MD: Jaquita Folds Dohmier, [name]  CHIEF COMPLAINT: "What I have to remove?"  BREAST CANCER HISTORY:  Didi had bilateral screening mammography at the breast center 04/15/2013. This showed a possible mass in the right breast. Diagnostic right mammography and ultrasonography 05/09/2013 showed an irregular area of focal asymmetry in the right breast, which was not palpable. Ultrasound showed dystrophic calcification close to the area of asymmetry. The area of abnormality measured approximately 1.4 cm.  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 HER-2 was reported as negative during the multidisciplinary breast cancer conference 05/22/2013. The written report is pending.   On 05/20/2013 the patient underwent bilateral breast MRI which showed a 2.3 cm irregular enhancing mass in the right breast at the 12:00 position. There were no other areas of concern in either breast and there were no abnormal appearing lymph nodes. However incidentally a 6 mm enhancing right paramedian sternal lesion was noted.  The patient's subsequent history is as detailed below   INTERVAL HISTORY: Kemoni returns today for followup of her breast cancer accompanied by her daughter from [place]. Last visit here in May she started anastrozole. She only had to pay $7 a month for that. She is tolerating it well, the only side effect that she has noted is a little bit of worsening of a right carpal tunnel symptoms. These are not new but they are little bit more intense. She was  supposed to have had a bone density before this visit but she is she is not due for that until December of this year  REVIEW OF SYSTEMS: Her hair may be thinning slightly but she thinks is from her thyroid and it was happening before. She may be gaining a little weight, but again that's not new. Sometimes she has pains in her right shoulder. She keeps a cough, mostly dry, which has been attributed to reflux issues. She does have a little bit of a sinus drainage. Has been no fever, rash, or bleeding. Hot flashes and vaginal dryness are not a concern. She is a little bit of soreness in the right armpit and occasional shooting pains across the right breast. She was reassured both of these are very normal at this point. A detailed review of systems was otherwise negative.  PAST MEDICAL HISTORY: Past Medical History  Diagnosis Date  . Hyperlipidemia   . Osteoporosis   . Thyroid disease   . Wears glasses   . HOH (hard of hearing)   . Sleep apnea     uses a cpap  . Arthritis   . Cancer     breast  . Seasonal allergies   . Hx of radiation therapy 07/18/13-08/14/13    right breast total dose 50 Gy    PAST SURGICAL HISTORY: Past Surgical History  Procedure Laterality Date  . Abdominal hysterectomy      fibroids  . Left oophorectomy    . Colonoscopy    . Partial mastectomy with needle localization and axillary sentinel lymph node bx Right 05/28/2013    Procedure: PARTIAL MASTECTOMY WITH NEEDLE LOCALIZATION AND AXILLARY SENTINEL  LYMPH NODE BIOPSY;  Surgeon: Adin Hector, MD;  Location: [facility];  Service: General;  Laterality: Right;    FAMILY HISTORY Family History  Problem Relation Age of Onset  . Hypertension Mother   . Diabetes Maternal Aunt   . Stroke      grandfather  The patient knows little about her father. Her mother is living, 84 years old as of January 2015. The patient was an only child. There is no history of breast or ovarian cancer in the  family  GYNECOLOGIC HISTORY:  Menarche age 79, first live birth age 53, the patient is [place] P2. 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 She is divorced and lives alone with [name], who are two nonverbal parakeets. Her daughter [name] is a housewife in [place]. Her daughter Cynia Abruzzo is a college professor in [employer], teaching urban education. The patient's niece [name] MD is a neurologist working at [employer]: Not in place. The patient was given the appropriate documents at the time of her first visit here 05/22/2013 to complete and notarize at her discretion. She intends to name her daughter [name] as her healthcare power of attorney. She can be reached at [phone]   HEALTH MAINTENANCE: History  Substance Use Topics  . Smoking status: Never Smoker   . Smokeless tobacco: Not on file  . Alcohol Use: Yes     Comment: occasionally     Colonoscopy:  PAP:  Bone density:04/20/2012 showed mild osteopenia with a T score of -1.3  Lipid panel:  Allergies  Allergen Reactions  . Codeine     REACTION: sick to stomach    Current Outpatient Prescriptions  Medication Sig Dispense Refill  . alendronate (FOSAMAX) 70 MG tablet HOLD      . anastrozole (ARIMIDEX) 1 MG tablet Take 1 tablet (1 mg total) by mouth daily.  30 tablet  3  . ARMOUR THYROID 30 MG tablet TAKE 1 TABLET BY MOUTH DAILY  90 tablet  3  . calcium citrate-vitamin D (CITRACAL+D) 315-200 MG-UNIT per tablet Take 1 tablet by mouth 2 (two) times daily.      . Coenzyme Q10 (COQ10) 200 MG CAPS Take 1 capsule by mouth at bedtime.      . CRESTOR 10 MG tablet TAKE 1 TABLET BY MOUTH DAILY  30 tablet  6  . loratadine (CLARITIN) 10 MG tablet Take 10 mg by mouth daily as  needed.       . mometasone (NASONEX) 50 MCG/ACT nasal spray Place 2 sprays into the nose daily as needed.      . Multiple Vitamins-Minerals (CENTRUM SILVER PO) Take 1 tablet by mouth.      Marland Kitchen omeprazole (PRILOSEC) 40 MG capsule Take 1 capsule (40 mg total) by mouth daily.  30 capsule  3  . OVER THE COUNTER MEDICATION Mega Red Capsules 1-2 per month      . OVER THE COUNTER MEDICATION Take 2 capsules by mouth daily. Collagen Renew Supplement      . vitamin B-12 (CYANOCOBALAMIN) 50 MCG tablet Take 50 mcg by mouth.       No current facility-administered medications for this visit.    OBJECTIVE: Middle-aged African American who appears younger than stated age 60:   11/28/13 1455  BP: 126/77  Pulse: 71  Temp: 98.2 F (36.8 C)  Resp: 18     Body mass index is 28.31 kg/(m^2).    ECOG FS:1 - Symptomatic but completely ambulatory  Ocular: Sclerae unicteric, pupils round and equal Ear-nose-throat: Oropharynx clear and moist Lymphatic: No cervical or supraclavicular adenopathy Lungs no rales or rhonchi Heart regular rate and rhythm Abd soft, nontender, positive bowel sounds, no organomegaly MSK no focal spinal tenderness, no right upper extremity lymphedema Neuro: non-focal, well-oriented, positive affect Breasts: The right breast is status post lumpectomy and radiation. There is mild hyperpigmentation, some skin thickening, and some scar underlying the incision. There is no evidence of local recurrence The right axilla is benign. The left breast is unremarkable  LAB RESULTS:  CMP     Component Value Date/Time   NA 141 11/21/2013 1417   NA 141 07/03/2013 1356   K 3.9 11/21/2013 1417   K 3.9 07/03/2013 1356   CL 108 07/03/2013 1356   CO2 26 11/21/2013 1417   CO2 27 07/03/2013 1356   GLUCOSE 113 11/21/2013 1417   GLUCOSE 70 07/03/2013 1356   BUN 10.7 11/21/2013 1417   BUN 9 07/03/2013 1356   CREATININE 1.0 11/21/2013 1417   CREATININE 1.0 07/03/2013 1356   CALCIUM 9.5 11/21/2013 1417    CALCIUM 9.4 07/03/2013 1356   PROT 7.6 11/21/2013 1417   PROT 7.2 07/03/2013 1356   ALBUMIN 3.9 11/21/2013 1417   ALBUMIN 4.2 07/03/2013 1356   AST 32 11/21/2013 1417   AST 21 07/03/2013 1356   ALT 25 11/21/2013 1417   ALT 17 07/03/2013 1356   ALKPHOS 53 11/21/2013 1417   ALKPHOS 36* 07/03/2013 1356   BILITOT 0.25 11/21/2013 1417   BILITOT 0.6 07/03/2013 1356   GFRNONAA 66.33 03/25/2010 0828    I No results found for this basename: SPEP,  UPEP,   kappa and lambda light chains    Lab Results  Component Value Date   WBC 4.2 11/21/2013   NEUTROABS 2.4 11/21/2013   HGB 12.2 11/21/2013   HCT 37.1 11/21/2013   MCV 90.9 11/21/2013   PLT 207 11/21/2013      Chemistry      Component Value Date/Time   NA 141 11/21/2013 1417   NA 141 07/03/2013 1356   K 3.9 11/21/2013 1417   K 3.9 07/03/2013 1356   CL 108 07/03/2013 1356   CO2 26 11/21/2013 1417   CO2 27 07/03/2013 1356   BUN 10.7 11/21/2013 1417   BUN 9 07/03/2013 1356   CREATININE 1.0 11/21/2013 1417   CREATININE 1.0 07/03/2013 1356      Component Value Date/Time   CALCIUM 9.5 11/21/2013 1417   CALCIUM 9.4 07/03/2013 1356   ALKPHOS 53 11/21/2013 1417   ALKPHOS 36* 07/03/2013 1356   AST 32 11/21/2013 1417   AST 21 07/03/2013 1356   ALT 25 11/21/2013 1417   ALT 17 07/03/2013 1356   BILITOT 0.25 11/21/2013 1417   BILITOT 0.6 07/03/2013 1356       No results found for this basename: LABCA2    No components found with this basename: LABCA125    No results found for this basename: INR,  in the last 168 hours  Urinalysis No results found for this basename: colorurine,  appearanceur,  labspec,  phurine,  glucoseu,  hgbur,  bilirubinur,  ketonesur,  proteinur,  urobilinogen,  nitrite,  leukocytesur    STUDIES: No results found.   ASSESSMENT: 73 y.o. [place] woman status post right breast biopsy  05/14/2013 for a clinical T2 N0, stage IIA invasive ductal carcinoma, grade 2 or 3, estrogen receptor 99% positive, progesterone receptor 50% positive,  with an MIB-1 of 91%, and no HER-2 amplification  (1) 6 mm enhancing right sternal lesion noted by breast MRI 05/20/2013-- negative on PET 06/04/2013  (2) status post right lumpectomy and sentinel lymph node sampling to 07/12/2013 for a pT2 pN0, stage IIA invasive ductal carcinoma, mucinous, grade 2, with repeat HER-2 again negative.  (3) Oncotype score of 18 predicts a 10 year risk of outside the breast recurrence of 12% if the patient's only systemic treatment is tamoxifen for 5 years. It also predicts a minimal benefit from chemotherapy.  (4) adjuvant radiation completed 08/14/2013  (5) anastrozole started 09/06/2013; repeat bone density do December 2015  PLAN: Franchelle is doing just fine with the anastrozole and the plan will be to continue that for 5 years. She bought a compression sleeve, but she would be interested in something a little bit more fashionable and I gave her information on the lympheDIVA sleeves as well as a new prescription.   I think she has some carpal tunnel in the right breast. I suggested she get a splint and wear it at night and if that does not really the symptoms after a few weeks we can referred to hand surgery  Otherwise she knows to call for any problems that may develop before next visit here.     Chauncey Cruel, MD   11/28/2013 3:07 PM

## 2013-12-25 ENCOUNTER — Ambulatory Visit (INDEPENDENT_AMBULATORY_CARE_PROVIDER_SITE_OTHER): Payer: Medicare PPO | Admitting: General Surgery

## 2013-12-26 ENCOUNTER — Ambulatory Visit (INDEPENDENT_AMBULATORY_CARE_PROVIDER_SITE_OTHER): Payer: Medicare PPO | Admitting: General Surgery

## 2014-01-02 ENCOUNTER — Encounter: Payer: Self-pay | Admitting: Family Medicine

## 2014-01-02 ENCOUNTER — Encounter: Payer: Self-pay | Admitting: General Practice

## 2014-01-02 ENCOUNTER — Ambulatory Visit (INDEPENDENT_AMBULATORY_CARE_PROVIDER_SITE_OTHER): Payer: Medicare PPO | Admitting: Family Medicine

## 2014-01-02 VITALS — BP 136/80 | HR 68 | Temp 98.2°F | Resp 16 | Ht 64.0 in | Wt 166.2 lb

## 2014-01-02 DIAGNOSIS — M810 Age-related osteoporosis without current pathological fracture: Secondary | ICD-10-CM

## 2014-01-02 DIAGNOSIS — E559 Vitamin D deficiency, unspecified: Secondary | ICD-10-CM

## 2014-01-02 DIAGNOSIS — Z0000 Encounter for general adult medical examination without abnormal findings: Secondary | ICD-10-CM

## 2014-01-02 DIAGNOSIS — E785 Hyperlipidemia, unspecified: Secondary | ICD-10-CM

## 2014-01-02 DIAGNOSIS — E039 Hypothyroidism, unspecified: Secondary | ICD-10-CM

## 2014-01-02 DIAGNOSIS — Z23 Encounter for immunization: Secondary | ICD-10-CM

## 2014-01-02 LAB — CBC WITH DIFFERENTIAL/PLATELET
BASOS PCT: 0.4 % (ref 0.0–3.0)
Basophils Absolute: 0 10*3/uL (ref 0.0–0.1)
EOS ABS: 0.1 10*3/uL (ref 0.0–0.7)
EOS PCT: 2 % (ref 0.0–5.0)
HEMATOCRIT: 36.5 % (ref 36.0–46.0)
Hemoglobin: 11.8 g/dL — ABNORMAL LOW (ref 12.0–15.0)
LYMPHS ABS: 1.4 10*3/uL (ref 0.7–4.0)
Lymphocytes Relative: 36.4 % (ref 12.0–46.0)
MCHC: 32.4 g/dL (ref 30.0–36.0)
MCV: 92.1 fl (ref 78.0–100.0)
Monocytes Absolute: 0.5 10*3/uL (ref 0.1–1.0)
Monocytes Relative: 12.2 % — ABNORMAL HIGH (ref 3.0–12.0)
Neutro Abs: 1.9 10*3/uL (ref 1.4–7.7)
Neutrophils Relative %: 49 % (ref 43.0–77.0)
PLATELETS: 233 10*3/uL (ref 150.0–400.0)
RBC: 3.97 Mil/uL (ref 3.87–5.11)
RDW: 15.8 % — ABNORMAL HIGH (ref 11.5–15.5)
WBC: 3.9 10*3/uL — ABNORMAL LOW (ref 4.0–10.5)

## 2014-01-02 LAB — VITAMIN D 25 HYDROXY (VIT D DEFICIENCY, FRACTURES): VITD: 43.5 ng/mL (ref 30.00–100.00)

## 2014-01-02 LAB — HEPATIC FUNCTION PANEL
ALT: 22 U/L (ref 0–35)
AST: 27 U/L (ref 0–37)
Albumin: 4 g/dL (ref 3.5–5.2)
Alkaline Phosphatase: 48 U/L (ref 39–117)
BILIRUBIN TOTAL: 0.3 mg/dL (ref 0.2–1.2)
Bilirubin, Direct: 0 mg/dL (ref 0.0–0.3)
Total Protein: 7.7 g/dL (ref 6.0–8.3)

## 2014-01-02 LAB — TSH: TSH: 2.93 u[IU]/mL (ref 0.35–4.50)

## 2014-01-02 LAB — BASIC METABOLIC PANEL
BUN: 13 mg/dL (ref 6–23)
CALCIUM: 9.5 mg/dL (ref 8.4–10.5)
CO2: 27 mEq/L (ref 19–32)
CREATININE: 0.9 mg/dL (ref 0.4–1.2)
Chloride: 105 mEq/L (ref 96–112)
GFR: 75.36 mL/min (ref >60.00)
GLUCOSE: 86 mg/dL (ref 70–99)
Potassium: 4.2 mEq/L (ref 3.5–5.1)
Sodium: 137 mEq/L (ref 135–145)

## 2014-01-02 LAB — LIPID PANEL
CHOLESTEROL: 153 mg/dL (ref 0–200)
HDL: 59.5 mg/dL (ref >39.00)
LDL CALC: 82 mg/dL (ref 0–99)
NonHDL: 93.5
Total CHOL/HDL Ratio: 3
Triglycerides: 60 mg/dL (ref 0.0–149.0)
VLDL: 12 mg/dL (ref 0.0–40.0)

## 2014-01-02 MED ORDER — ALENDRONATE SODIUM 70 MG PO TABS: 70.0000 mg | ORAL_TABLET | ORAL | Status: DC

## 2014-01-02 NOTE — Assessment & Plan Note (Signed)
Chronic problem.  Due for DEXA in Dec- order entered.  Pt had stopped Fosamax due to starting Omeprazole.  Will restart Fosamax as Arimidex is known to worsen osteoporosis.  If pt has recurrent GERD, will start Ranitidine.  Pt expressed understanding and is in agreement w/ plan.

## 2014-01-02 NOTE — Assessment & Plan Note (Signed)
Chronic problem.  Tolerating statin w/o difficulty.  Check labs.  Adjust meds prn  

## 2014-01-02 NOTE — Progress Notes (Signed)
   Subjective:    Patient ID: EDILIA GHUMAN, female    DOB: [birth year], 73 y.o.   MRN: [id number]  HPI Here today for CPE.  Risk Factors: Hyperlipidemia- chronic problem, on Crestor.  Denies abd pain, N/V, myalgias. Osteoporosis- chronic problem, stopped Fosamax due to severe pain in side that responded to Omeprazole.  Due for DEXA later this year. Hypothyroid- chronic problem, on Armour thyroid. Physical Activity: no regular exercise but plans to restart her routine next week (3 miles/day) Depression: denies current sxs Hearing: normal to conversational tones and whispered voice at 6 ft ADL's: independent Cognitive: normal linear thought process, memory and attention intact Home Safety: safe at home Height, Weight, BMI, Visual Acuity: see vitals, vision corrected to 20/20 w/ glasses Counseling: UTD on colonoscopy, mammo.  Due for DEXA at end of year.  No need for paps. Labs Ordered: See A&P Care Plan: See A&P    Review of Systems Patient reports no vision/ hearing changes, adenopathy,fever, weight change,  persistant/recurrent hoarseness , swallowing issues, chest pain, palpitations, edema, persistant/recurrent cough, hemoptysis, dyspnea (rest/exertional/paroxysmal nocturnal), gastrointestinal bleeding (melena, rectal bleeding), abdominal pain, significant heartburn, bowel changes, GU symptoms (dysuria, hematuria, incontinence), Gyn symptoms (abnormal  bleeding, pain),  syncope, focal weakness, memory loss, numbness & tingling, skin/hair/nail changes, abnormal bruising or bleeding, anxiety, or depression.     Objective:   Physical Exam General Appearance:    Alert, cooperative, no distress, appears stated age  Head:    Normocephalic, without obvious abnormality, atraumatic  Eyes:    PERRL, conjunctiva/corneas clear, EOM's intact, fundi    benign, both eyes  Ears:    Normal TM's and external ear canals, both ears  Nose:   Nares normal, septum midline, mucosa normal, no drainage      or sinus tenderness  Throat:   Lips, mucosa, and tongue normal; teeth and gums normal  Neck:   Supple, symmetrical, trachea midline, no adenopathy;    Thyroid: no enlargement/tenderness/nodules  Back:     Symmetric, no curvature, ROM normal, no CVA tenderness  Lungs:     Clear to auscultation bilaterally, respirations unlabored  Chest Wall:    No tenderness or deformity   Heart:    Regular rate and rhythm, S1 and S2 normal, no murmur, rub   or gallop  Breast Exam:    Deferred to mammo  Abdomen:     Soft, non-tender, bowel sounds active all four quadrants,    no masses, no organomegaly  Genitalia:    Deferred  Rectal:    Extremities:   Extremities normal, atraumatic, no cyanosis or edema  Pulses:   2+ and symmetric all extremities  Skin:   Skin color, texture, turgor normal, no rashes or lesions  Lymph nodes:   Cervical, supraclavicular, and axillary nodes normal  Neurologic:   CNII-XII intact, normal strength, sensation and reflexes    throughout          Assessment & Plan:

## 2014-01-02 NOTE — Patient Instructions (Signed)
Follow up in 6 months to recheck cholesterol We'll notify you of your lab results and make any changes if needed Keep up the good work!  You look great! We'll call you with your bone density appt Restart the Fosamax weekly STOP the omeprazole.  If the heartburn or reflux returns, please call me so we can start a different medication Call with any questions or concerns Enjoy the fall season!

## 2014-01-02 NOTE — Assessment & Plan Note (Signed)
Chronic problem.  Currently asymptomatic.  Check labs.  Adjust meds prn  

## 2014-01-02 NOTE — Assessment & Plan Note (Signed)
Pt's PE WNL.  UTD on health maintenance.  Will schedule DEXA for end of this year.  Written screening schedule updated and given to pt.   Check labs.  Anticipatory guidance provided.

## 2014-01-02 NOTE — Assessment & Plan Note (Signed)
Chronic problem.  Check Vit D level, replete prn.

## 2014-01-02 NOTE — Progress Notes (Signed)
Pre visit review using our clinic review tool, if applicable. No additional management support is needed unless otherwise documented below in the visit note. 

## 2014-01-19 ENCOUNTER — Other Ambulatory Visit: Payer: Self-pay | Admitting: Oncology

## 2014-01-19 DIAGNOSIS — C50411 Malignant neoplasm of upper-outer quadrant of right female breast: Secondary | ICD-10-CM

## 2014-01-26 ENCOUNTER — Other Ambulatory Visit: Payer: Self-pay | Admitting: Internal Medicine

## 2014-02-05 ENCOUNTER — Encounter: Payer: Self-pay | Admitting: Neurology

## 2014-02-05 ENCOUNTER — Ambulatory Visit: Payer: Medicare Other | Admitting: Neurology

## 2014-02-11 ENCOUNTER — Telehealth: Payer: Self-pay | Admitting: Family Medicine

## 2014-02-11 NOTE — Telephone Encounter (Signed)
I'm ok w/ her having Td (or Tdap) but pt needs to clarify w/ insurance which is preferred so that she doesn't get a bill

## 2014-02-11 NOTE — Telephone Encounter (Signed)
Please advise per insurance card scanned on 04/2013 pt has medicare. [place] for straight TD?

## 2014-02-11 NOTE — Telephone Encounter (Signed)
Pt will call insurance to verify.

## 2014-02-11 NOTE — Telephone Encounter (Signed)
Caller name: Isabeau  Call back number: [phone]   Reason for call:  Pt was informed she needs her Tdap.  Is she ok to schedule?

## 2014-02-25 ENCOUNTER — Telehealth: Payer: Self-pay | Admitting: Family Medicine

## 2014-02-25 NOTE — Telephone Encounter (Signed)
agree

## 2014-02-25 NOTE — Telephone Encounter (Signed)
Left message for patient to return my call.

## 2014-02-25 NOTE — Telephone Encounter (Signed)
Caller name: Telma Relation to pt: self Call back number: [phone] Pharmacy:  Reason for call:   Patient states that she needs a tetanus(she is traveling to [place]). Patient wants to know if it would be ok to get this injection since she has been recently diagnosed with cancer.

## 2014-02-25 NOTE — Telephone Encounter (Signed)
Pt can have a TD, please schedule for a nurse visit.

## 2014-02-27 ENCOUNTER — Other Ambulatory Visit: Payer: Self-pay | Admitting: Oncology

## 2014-02-27 DIAGNOSIS — C50411 Malignant neoplasm of upper-outer quadrant of right female breast: Secondary | ICD-10-CM

## 2014-03-10 ENCOUNTER — Encounter: Payer: Self-pay | Admitting: Neurology

## 2014-03-10 ENCOUNTER — Ambulatory Visit (INDEPENDENT_AMBULATORY_CARE_PROVIDER_SITE_OTHER): Payer: Medicare HMO | Admitting: Neurology

## 2014-03-10 VITALS — BP 114/73 | HR 80 | Temp 98.1°F | Resp 14 | Ht 63.5 in | Wt 166.0 lb

## 2014-03-10 DIAGNOSIS — Z9989 Dependence on other enabling machines and devices: Secondary | ICD-10-CM

## 2014-03-10 DIAGNOSIS — G4750 Parasomnia, unspecified: Secondary | ICD-10-CM

## 2014-03-10 DIAGNOSIS — G4733 Obstructive sleep apnea (adult) (pediatric): Secondary | ICD-10-CM

## 2014-03-10 DIAGNOSIS — G478 Other sleep disorders: Secondary | ICD-10-CM

## 2014-03-10 MED ORDER — MELATONIN 5 MG PO TABS: ORAL_TABLET | ORAL | Status: DC

## 2014-03-10 NOTE — Progress Notes (Signed)
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 As a younger woman, she  never was able to remember her dreams. Now she has vivid dreams,  starting about five years ago.  These are detailed and  with scarry and threatening  content. "Being mugged,  chasing after something, caught in a house on fire,  having intruders in the house, yelling for help." During one of these spells, she  injured herself, "poked"  her eye.   She started banging on pillows and awoke  from her activities,  embarrassed from this. Her family memembers  are concerned about her falling out of bed.  Her behavior started in the morning hours, and is very likely REM related. Epworth 2 points.  Thunderous snoring and apnea were witnessed by her daughters, who don't want to share a hotel room with her.   06-25-12 , patient underwent SPLIT study with a diagnostic AHi of 15.9 and was titrated to 7 cm water. Her download from DME 06-05-12 showed an AHI of 2.2 and todays in - office download shows again 2.1 AHi and  6.53 hours nightly use.   She is compliant but has questions about alternatives therapies as she travels a lot and feels it's a hassle to "drag along". We discussed  TMJ and dental devices. She is travelling to [place] and planned to go to [place]- and for african travel,  I would support rather a dental device even if less effective. Education about OSA and Sleep study/ download data.    01/25/13 (LL):  Monica Diaz returns for follow up for sleep apnea.  Doing very well. She has brought in her machine and results have been downloaded.  She reports much more restful sleep and feeling refreshed when she awakens. AHI now 1.3. CMS compliance established, average hours of usage is 6 hours and 48 minutes, Usage days are 88/90,  98%.  Epworth today is 2. She discussed getting a dental appliance for travel with her dentist, who refused to make her one, saying it was not advisable due to problems with jaw and teeth alignment that could result because of use.  She takes her machine with her whenever she travels, but has forgotten it a couple times on short trips.  She admits worry when she does not have it.  03-10-14; CD Patient  back  after 19 month for a CPAP compliance visit, has had difficulties using machine as sh could not get destilled water at her destination, [place]. And that she had to this for about 5 years though this was in 2013. 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 She also has started to bang on a pillow or abnormal friends of them by screaming or talking loudly. Her family members were concerned about her possibly falling out of bed. The behavior started in the early morning hours and not right after sleep onset. Her daughters had witnessed from the wrist snoring and apnea. After a sleep study she begun using CPAP she was only able for the last 3 days out of 90 to use her CPAP which makes her very low compliance of 3%. When using it she uses minutes the machine for 7 hours and 1 minute and her residual  AHI is 1.9. She does have moderate to high air leaks. The machine is set at 7 cm water pressure was 1 cm EPR.  Her Epworth sleepiness score is at 3 points at her fatigue severity at 12 points- while not on CPAP. Marland Kitchen Since not using CPAP , she has more trouble with her neck crackling , feeling tight and tender- and at times she seems to have a tension headache, as if a tight band runs around her head and forehead. She has again begun to act out dreams, and has less nightmares.  I would like for her to take medication for REM BD, and suggested melatonin.  Her apnea remains poorly controlled. She gained some weight.       REVIEW OF SYSTEMS: Full 14 system review of systems performed and notable only for:  Ear/Nose/Throat: hearing loss  Skin: moles  Respiratory: cough  Weight gain, REM behavior or other Parasomnia. Apnea- not compliant with CPAP.   ALLERGIES: Allergies  Allergen Reactions  . Codeine     REACTION: sick to stomach    HOME MEDICATIONS: Outpatient Prescriptions Prior to Visit  Medication Sig Dispense Refill  . alendronate (FOSAMAX) 70 MG tablet Take 1 tablet (70 mg total) by mouth once a week. Take with a full glass of water on an empty stomach. 12 tablet 3  . anastrozole (ARIMIDEX) 1 MG tablet TAKE 1 TABLET BY MOUTH EVERY DAY 30 tablet 3  . ARMOUR THYROID 30 MG tablet TAKE 1 TABLET BY MOUTH DAILY 90 tablet 3  . calcium citrate-vitamin D (CITRACAL+D) 315-200 MG-UNIT per tablet Take 1 tablet by mouth 2 (two) times daily.    . Coenzyme Q10 (COQ10) 200 MG CAPS Take 1 capsule by mouth at bedtime.    . CRESTOR 10 MG tablet TAKE 1 TABLET BY MOUTH DAILY 30 tablet 6  . loratadine (CLARITIN) 10 MG tablet Take 10 mg by mouth daily as needed.     . mometasone (NASONEX) 50 MCG/ACT nasal spray Place 2 sprays into the nose daily as needed.    . Multiple Vitamins-Minerals (CENTRUM SILVER PO) Take 1 tablet by mouth.    Marland Kitchen OVER THE COUNTER MEDICATION Mega Red Capsules 1-2 per month    . OVER THE COUNTER MEDICATION Take 2 capsules by mouth daily. Collagen Renew Supplement    . vitamin B-12 (CYANOCOBALAMIN) 50 MCG tablet Take 50 mcg by mouth.     No facility-administered medications prior to visit.    PAST MEDICAL HISTORY: Past Medical History  Diagnosis Date  . Hyperlipidemia   . Osteoporosis   . Thyroid disease   . Wears glasses   . HOH (hard of hearing)   . Sleep apnea     uses a cpap  . Arthritis   . Cancer     breast  . Seasonal allergies   . Hx of radiation therapy 07/18/13-08/14/13    right breast total dose 50 Gy    PAST SURGICAL HISTORY: Past Surgical History  Procedure  Laterality Date  . Abdominal hysterectomy      fibroids  . Left oophorectomy    . Colonoscopy    . Partial mastectomy with needle localization and axillary sentinel lymph node bx Right 05/28/2013    Procedure: PARTIAL MASTECTOMY WITH NEEDLE LOCALIZATION AND AXILLARY SENTINEL LYMPH NODE BIOPSY;  Surgeon: Adin Hector, MD;  Location: [facility];  Service: General;  Laterality: Right;    FAMILY HISTORY: Family History  Problem Relation Age of Onset  . Hypertension  Mother   . Diabetes Maternal Aunt   . Stroke      grandfather    SOCIAL HISTORY: History   Social History  . Marital Status: Divorced    Spouse Name: N/A    Number of Children: 2  . Years of Education: N/A   Occupational History  .     Social History Main Topics  . Smoking status: Never Smoker   . Smokeless tobacco: Not on file  . Alcohol Use: Yes     Comment: occasionally  . Drug Use: No  . Sexual Activity: Not on file   Other Topics Concern  . Not on file   Social History Narrative   Right handed, Caffeine 2-3 cups average daily.  Divorced, 2 daughters.     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      PHYSICAL EXAM  Filed Vitals:   03/10/14 1108  BP: 114/73  Pulse: 80  Temp: 98.1 F (36.7 C)  TempSrc: Oral  Resp: 14  Height: 5' 3.5" (1.613 m)  Weight: 166 lb (75.297 kg)   Body mass index is 28.94 kg/(m^2).  General: Patient is awake, alert and in no acute distress. Well developed and groomed. Head: Normocephalic.  Ears, Nose and Throat: Mallompatti 3.  Neck: Neck is supple. Respiratory: LCTA. Cardiovascular: No carotid artery bruits.  Heart is regular rate and rhythm with no murmurs. Skin: No rash. Bruising over the right  eye.  Trunk: Slender.   Neurologic Exam  Mental Status: Awake, alert and oriented to person, place and time.  Recent and remote memory, attention span, concentration and fund of knowledge are normal.  Language is fluent and comprehension intact. Cranial Nerves:   Pupils are equal and reactive to light.  Visual fields are full to confrontation.  Conjugate eye movements are full and symmetric.  Facial sensation and strength are symmetric.  Hearing is intact.  Palate elevated symmetrically and uvula is midline.  Shoulder shrug is symmetric.  Tongue is midline. Motor: Normal bulk and tone.  Full strength in the upper and lower extremities.  No cogwheeling and no  pronator drift. Sensory: Intact and symmetric to light touch, pinprick. Coordination: No ataxia or dysmetria on finger-nose or rapid alternating movement testing. Gait and Station: Narrow based gait, able to walk on heels and toes. Romberg is negative. Reflexes: Deep tendon reflexes in the upper and lower extremities are present,  1+ and symmetric.    DIAGNOSTIC DATA (LABS, IMAGING, TESTING) - I reviewed patient records, labs, notes, testing and imaging myself where available.  Lab Results  Component Value Date   WBC 3.9* 01/02/2014   HGB 11.8* 01/02/2014   HCT 36.5 01/02/2014   MCV 92.1 01/02/2014   PLT 233.0 01/02/2014      Component Value Date/Time   NA 137 01/02/2014 1059   NA 141 11/21/2013 1417   K 4.2 01/02/2014 1059   K 3.9 11/21/2013 1417   CL 105 01/02/2014 1059   CO2 27 01/02/2014 1059   CO2 26 11/21/2013 1417   GLUCOSE 86 01/02/2014 1059   GLUCOSE 113 11/21/2013 1417   BUN 13 01/02/2014 1059   BUN 10.7 11/21/2013 1417   CREATININE 0.9 01/02/2014 1059   CREATININE 1.0 11/21/2013 1417   CALCIUM 9.5 01/02/2014 1059   CALCIUM 9.5 11/21/2013 1417   PROT 7.7 01/02/2014 1059   PROT 7.6 11/21/2013 1417   ALBUMIN 4.0 01/02/2014 1059   ALBUMIN 3.9 11/21/2013 1417   AST 27 01/02/2014 1059   AST 32 11/21/2013  1417   ALT 22 01/02/2014 1059   ALT 25 11/21/2013 1417   ALKPHOS 48 01/02/2014 1059   ALKPHOS 53 11/21/2013 1417   BILITOT 0.3 01/02/2014 1059   BILITOT 0.25 11/21/2013 1417   GFRNONAA 66.33 03/25/2010 0828   Lab Results  Component Value Date   CHOL 153  01/02/2014   HDL 59.50 01/02/2014   LDLCALC 82 01/02/2014   LDLDIRECT 163.8 04/11/2011   TRIG 60.0 01/02/2014   CHOLHDL 3 01/02/2014   No results found for: HGBA1C No results found for: VITAMINB12 Lab Results  Component Value Date   TSH 2.93 01/02/2014    PSG/CPAP Reviewed     ASSESSMENT AND PLAN  1) Patient doing well with CPAP treatment for mild apnea 15.9 AHI, CPAP FX for her at 7 cm - downloads compared, AHI now 1.9 .  CMS compliance could not be established, she has only 3% compliance . Average hours of usage is 7 hours and 1 minutes, Epworth today is 3, FSS is 12 points. I would like for her to resume CPAP, with humidified air.  She will need to use a humidifier in her bedroom. Stay off supine position.   2) Again episodes of confusional arousals while off  CPAP . headaches , neck and head tightness.  She was having an AHI of 15.9 and no significant hypoxemia, and positional dependent.  Her PSG indicated that she could be helped by a dental device. Her dentis has disagreed, he fears she had extensive  dental work and TMJ.   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   Onset five years ago. Some of these led to a fall out of bed.   Recommend to start melatonin, 5 mg one hour before bed time. .  Disposition: Rv in 6-8 month with Ernestine Mcmurray ,NP , CPAP re-instated.   Larey Seat, MD    03/10/2014, 11:57 AM [facility] [address] [place], [place] [address] [phone]

## 2014-03-18 ENCOUNTER — Ambulatory Visit (INDEPENDENT_AMBULATORY_CARE_PROVIDER_SITE_OTHER): Payer: Medicare PPO

## 2014-03-18 DIAGNOSIS — Z23 Encounter for immunization: Secondary | ICD-10-CM

## 2014-03-18 NOTE — Progress Notes (Signed)
Patient ID: Monica Diaz, female   DOB: Oct 06, 1940, 73 y.o.   MRN: [id number] Pre visit review using our clinic review tool, if applicable. No additional management support is needed unless otherwise documented below in the visit note.  Patient tolerated vaccine well.

## 2014-03-19 ENCOUNTER — Encounter: Payer: Self-pay | Admitting: *Deleted

## 2014-03-19 NOTE — [facility] Oncology Navigator Note (Signed)
I called patient to check in.  Left voice mail message with contact information and request that she return my call. 

## 2014-03-19 NOTE — [facility] Oncology Navigator Note (Signed)
Patient returned my call and reports that she is doing well.  She continues to have some occasional soreness and pains in her right breast.  She is taking the anastrozole and denies any side effects.  She denies any questions or concerns at this time.  I encouraged her to call me for any needs.

## 2014-03-22 ENCOUNTER — Other Ambulatory Visit: Payer: Self-pay | Admitting: Family Medicine

## 2014-03-24 NOTE — Telephone Encounter (Signed)
Med filled.  

## 2014-04-21 ENCOUNTER — Ambulatory Visit
Admission: RE | Admit: 2014-04-21 | Discharge: 2014-04-21 | Disposition: A | Discharge status: Home / Self Care | Payer: Medicare PPO | Source: Ambulatory Visit | Attending: Oncology | Admitting: Oncology

## 2014-04-21 ENCOUNTER — Other Ambulatory Visit: Payer: Medicare PPO

## 2014-04-21 ENCOUNTER — Other Ambulatory Visit: Payer: Self-pay | Admitting: Oncology

## 2014-04-21 DIAGNOSIS — C50411 Malignant neoplasm of upper-outer quadrant of right female breast: Secondary | ICD-10-CM

## 2014-04-21 DIAGNOSIS — Z853 Personal history of malignant neoplasm of breast: Secondary | ICD-10-CM

## 2014-05-18 ENCOUNTER — Other Ambulatory Visit: Payer: Self-pay | Admitting: Oncology

## 2014-05-19 ENCOUNTER — Encounter: Payer: Self-pay | Admitting: *Deleted

## 2014-05-19 NOTE — [facility] Oncology Navigator Note (Signed)
Patient called and said that she was supposed to have a mammogram in December but that when she went to the [facility], they told her that there wasn't an order and she did not have it completed.  She understood that they would call Dr. Jana Hakim and then schedule her appointment.  She has not received a new appointment and she asked if I could help.  An order was present in EPIC so I called the [facility] and spoke with Juliann Pulse who confirmed that they had the order and asked me to have patient call scheduling to make her appointment.  I called patient back with the information.  Patient reports that she has been doing well.  She confirmed her upcoming appointments with lab and Dr. Jana Hakim.  I instructed her to call me back if she had any difficulties scheduling her mammogram or for any other needs.  Patient verbalized understanding.

## 2014-05-29 ENCOUNTER — Other Ambulatory Visit (HOSPITAL_BASED_OUTPATIENT_CLINIC_OR_DEPARTMENT_OTHER): Payer: Medicare PPO

## 2014-05-29 ENCOUNTER — Ambulatory Visit
Admission: RE | Admit: 2014-05-29 | Discharge: 2014-05-29 | Disposition: A | Discharge status: Home / Self Care | Payer: Medicare PPO | Source: Ambulatory Visit | Attending: Oncology | Admitting: Oncology

## 2014-05-29 DIAGNOSIS — C50411 Malignant neoplasm of upper-outer quadrant of right female breast: Secondary | ICD-10-CM

## 2014-05-29 DIAGNOSIS — C50811 Malignant neoplasm of overlapping sites of right female breast: Secondary | ICD-10-CM

## 2014-05-29 DIAGNOSIS — Z853 Personal history of malignant neoplasm of breast: Secondary | ICD-10-CM

## 2014-05-29 DIAGNOSIS — G4733 Obstructive sleep apnea (adult) (pediatric): Secondary | ICD-10-CM

## 2014-05-29 LAB — CBC WITH DIFFERENTIAL/PLATELET
BASO%: 0.2 % (ref 0.0–2.0)
BASOS ABS: 0 10*3/uL (ref 0.0–0.1)
EOS ABS: 0.1 10*3/uL (ref 0.0–0.5)
EOS%: 1.9 % (ref 0.0–7.0)
HCT: 35.8 % (ref 34.8–46.6)
HGB: 11.7 g/dL (ref 11.6–15.9)
LYMPH#: 1.9 10*3/uL (ref 0.9–3.3)
LYMPH%: 38.5 % (ref 14.0–49.7)
MCH: 30.2 pg (ref 25.1–34.0)
MCHC: 32.7 g/dL (ref 31.5–36.0)
MCV: 92.5 fL (ref 79.5–101.0)
MONO#: 0.5 10*3/uL (ref 0.1–0.9)
MONO%: 10.5 % (ref 0.0–14.0)
NEUT#: 2.4 10*3/uL (ref 1.5–6.5)
NEUT%: 48.9 % (ref 38.4–76.8)
PLATELETS: 207 10*3/uL (ref 145–400)
RBC: 3.87 10*6/uL (ref 3.70–5.45)
RDW: 14.6 % — ABNORMAL HIGH (ref 11.2–14.5)
WBC: 4.9 10*3/uL (ref 3.9–10.3)

## 2014-05-29 LAB — COMPREHENSIVE METABOLIC PANEL (CC13)
ALT: 24 U/L (ref 0–55)
ANION GAP: 11 meq/L (ref 3–11)
AST: 34 U/L (ref 5–34)
Albumin: 3.8 g/dL (ref 3.5–5.0)
Alkaline Phosphatase: 53 U/L (ref 40–150)
BILIRUBIN TOTAL: 0.28 mg/dL (ref 0.20–1.20)
BUN: 11 mg/dL (ref 7.0–26.0)
CHLORIDE: 107 meq/L (ref 98–109)
CO2: 25 mEq/L (ref 22–29)
Calcium: 9.3 mg/dL (ref 8.4–10.4)
Creatinine: 1.1 mg/dL (ref 0.6–1.1)
EGFR: 61 mL/min/{1.73_m2} — ABNORMAL LOW (ref >90)
GLUCOSE: 92 mg/dL (ref 70–140)
Potassium: 4.1 mEq/L (ref 3.5–5.1)
Sodium: 142 mEq/L (ref 136–145)
Total Protein: 7.2 g/dL (ref 6.4–8.3)

## 2014-06-05 ENCOUNTER — Telehealth: Payer: Self-pay | Admitting: Oncology

## 2014-06-05 ENCOUNTER — Encounter: Payer: Self-pay | Admitting: *Deleted

## 2014-06-05 ENCOUNTER — Ambulatory Visit (HOSPITAL_BASED_OUTPATIENT_CLINIC_OR_DEPARTMENT_OTHER): Payer: Medicare PPO | Admitting: Oncology

## 2014-06-05 VITALS — BP 115/75 | HR 77 | Temp 98.5°F | Resp 18 | Ht 63.5 in | Wt 167.1 lb

## 2014-06-05 DIAGNOSIS — Z170 Estrogen receptor positive status [ER+]: Secondary | ICD-10-CM

## 2014-06-05 DIAGNOSIS — M8580 Other specified disorders of bone density and structure, unspecified site: Secondary | ICD-10-CM | POA: Insufficient documentation

## 2014-06-05 DIAGNOSIS — C50411 Malignant neoplasm of upper-outer quadrant of right female breast: Secondary | ICD-10-CM

## 2014-06-05 DIAGNOSIS — G4733 Obstructive sleep apnea (adult) (pediatric): Secondary | ICD-10-CM

## 2014-06-05 NOTE — Telephone Encounter (Signed)
, °

## 2014-06-05 NOTE — Progress Notes (Signed)
[place]  [phone] Fax:([phone]) [phone]     ID: Monica Diaz OB: Nov 17, 1940  MR#: [id number]  CSN#:[id number]  PCP: Annye Asa, MD GYN:   SU: [name] OTHER MD: Jaquita Folds Dohmier, [name]  CHIEF COMPLAINT: Estrogen receptor positive breast cancer  CURRENT TREATMENT: Anastrozole    BREAST CANCER HISTORY: From the original intake note:  Monica Diaz had bilateral screening mammography at the breast center 04/15/2013. This showed a possible mass in the right breast. Diagnostic right mammography and ultrasonography 05/09/2013 showed an irregular area of focal asymmetry in the right breast, which was not palpable. Ultrasound showed dystrophic calcification close to the area of asymmetry. The area of abnormality measured approximately 1.4 cm.  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 HER-2 was reported as negative during the multidisciplinary breast cancer conference 05/22/2013. The written report is pending.   On 05/20/2013 the patient underwent bilateral breast MRI which showed a 2.3 cm irregular enhancing mass in the right breast at the 12:00 position. There were no other areas of concern in either breast and there were no abnormal appearing lymph nodes. However incidentally a 6 mm enhancing right paramedian sternal lesion was noted.  The patient's subsequent history is as detailed below   INTERVAL HISTORY: Monica Diaz returns today for followup of her breast cancer. The interval history is unremarkable and she is doing "fine". She just came back from a trip to [place]. She started a walking program but got a little too ambitious and walked 5 miles one day. This caused her some foot and groin pain. She is now  building up again gradually. She is also taking swimming lessons. She continues on anastrozole, which she tolerates well, with no significant hot flashes, vaginal dryness, or arthralgias myalgias. She obtains a drug at an [facility]  REVIEW OF SYSTEMS: She does have some hand symptoms particularly on the right side which are suggestive of carpal tunnel syndrome. We had previously discussed her wearing a splint but she actually did not follow up on that. She has mild seasonal allergies and some sinus problems. Her stools recently have been a little bit on the loose side. She has discomfort in the right axilla area which is of course 4 she had her surgery. She bruises easily. She has a little bit of back pain which is however not more intense or persistent than before. A detailed review of systems today was otherwise stable.  PAST MEDICAL HISTORY: Past Medical History  Diagnosis Date  . Hyperlipidemia   . Osteoporosis   . Thyroid disease   . Wears glasses   . HOH (hard of hearing)   . Sleep apnea     uses a cpap  . Arthritis   . Cancer     breast  . Seasonal allergies   . Hx of radiation therapy 07/18/13-08/14/13    right breast total dose 50 Gy    PAST SURGICAL HISTORY: Past Surgical History  Procedure Laterality Date  . Abdominal hysterectomy      fibroids  . Left oophorectomy    . Colonoscopy    . Partial mastectomy with needle localization and axillary sentinel lymph node bx Right 05/28/2013    Procedure: PARTIAL MASTECTOMY WITH NEEDLE LOCALIZATION AND AXILLARY SENTINEL LYMPH NODE BIOPSY;  Surgeon:  Adin Hector, MD;  Location: [facility];  Service: General;  Laterality: Right;    FAMILY HISTORY Family History  Problem Relation Age of Onset  . Hypertension Mother   . Diabetes Maternal Aunt   . Stroke      grandfather  The patient knows little about her father. Her mother is living, 39 years old as of January 2015. The patient was an only child. There is  no history of breast or ovarian cancer in the family  GYNECOLOGIC HISTORY:  Menarche age 16, first live birth age 47, the patient is [place] P2. 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 She is divorced and lives alone with [name], who are two nonverbal parakeets. Her daughter Monica Diaz is a housewife in [place]. Her daughter Monica Diaz is a college professor in [employer] [facility] teaching urban education. The patient's niece Monica Chase-Gregory MD is a neurologist working at [place]: Not in place. The patient was given the appropriate documents at the time of her first visit here 05/22/2013 to complete and notarize at her discretion. She intends to name her daughter [name] as her healthcare power of attorney. She can be reached at [phone]   HEALTH MAINTENANCE: History  Substance Use Topics  . Smoking status: Never Smoker   . Smokeless tobacco: Not on file  . Alcohol Use: Yes     Comment: occasionally     Colonoscopy:  PAP:  Bone density:04/21/2014 showed mild osteopenia with a T score of -1.4  Lipid panel:  Allergies  Allergen Reactions  . Codeine     REACTION: sick to stomach    Current Outpatient Prescriptions  Medication Sig Dispense Refill  . alendronate (FOSAMAX) 70 MG tablet Take 1 tablet (70 mg total) by mouth once a week. Take with a full glass of water on an empty stomach. 12 tablet 3  . anastrozole (ARIMIDEX) 1 MG tablet TAKE 1 TABLET BY MOUTH EVERY DAY 30 tablet 3  . ARMOUR THYROID 30 MG tablet TAKE 1 TABLET BY MOUTH DAILY 90 tablet 3  . calcium citrate-vitamin D (CITRACAL+D) 315-200 MG-UNIT per tablet Take 1 tablet by mouth 2 (two) times daily.    . Coenzyme Q10 (COQ10) 200 MG CAPS Take 1 capsule by mouth at bedtime.     . CRESTOR 10 MG tablet TAKE 1 TABLET BY MOUTH DAILY 30 tablet 6  . loratadine (CLARITIN) 10 MG tablet Take 10 mg by mouth daily as needed.     . Melatonin 5 MG TABS One tab 30-60 minutes before bed time.po  0  . mometasone (NASONEX) 50 MCG/ACT nasal spray Place 2 sprays into the nose daily as needed.    . Multiple Vitamins-Minerals (CENTRUM SILVER PO) Take 1 tablet by mouth.    Marland Kitchen omeprazole (PRILOSEC) 40 MG capsule   3  . OVER THE COUNTER MEDICATION Mega Red Capsules 1-2 per month    . OVER THE COUNTER MEDICATION Take 2 capsules by mouth daily. Collagen Renew Supplement    . vitamin B-12 (CYANOCOBALAMIN) 50 MCG tablet Take 50 mcg by mouth.     No current facility-administered medications for this visit.    OBJECTIVE: Middle-aged African Americanin no acute distress  Filed Vitals:   06/05/14 1510  BP: 115/75  Pulse: 77  Temp: 98.5  F (36.9 C)  Resp: 18     Body mass index is 29.13 kg/(m^2).    ECOG FS:1 - Symptomatic but completely ambulatory  Sclerae unicteric, pupils round and equal Oropharynx clear, teeth in good repair No cervical or supraclavicular adenopathy Lungs no rales or rhonchi Heart regular rate and rhythm Abd soft, nontender, positive bowel sounds MSK no focal spinal tenderness including palpation and percussion over the area were sometimes she has discomfort, minimal right upper extremity lymphedema Neuro: nonfocal, well oriented, positive affect Breasts: The right breast is status post lumpectomy and radiation. There is some scar tissue and some skin thickening from the treatments, but no evidence of local recurrence. The right axilla is benign per the left breast is unremarkable.  LAB RESULTS:  CMP     Component Value Date/Time   NA 142 05/29/2014 1438   NA 137 01/02/2014 1059   K 4.1 05/29/2014 1438   K 4.2 01/02/2014 1059   CL 105 01/02/2014 1059   CO2 25 05/29/2014 1438   CO2 27 01/02/2014 1059   GLUCOSE 92 05/29/2014 1438   GLUCOSE 86 01/02/2014  1059   BUN 11.0 05/29/2014 1438   BUN 13 01/02/2014 1059   CREATININE 1.1 05/29/2014 1438   CREATININE 0.9 01/02/2014 1059   CALCIUM 9.3 05/29/2014 1438   CALCIUM 9.5 01/02/2014 1059   PROT 7.2 05/29/2014 1438   PROT 7.7 01/02/2014 1059   ALBUMIN 3.8 05/29/2014 1438   ALBUMIN 4.0 01/02/2014 1059   AST 34 05/29/2014 1438   AST 27 01/02/2014 1059   ALT 24 05/29/2014 1438   ALT 22 01/02/2014 1059   ALKPHOS 53 05/29/2014 1438   ALKPHOS 48 01/02/2014 1059   BILITOT 0.28 05/29/2014 1438   BILITOT 0.3 01/02/2014 1059   GFRNONAA 66.33 03/25/2010 0828    I No results found for: SPEP  Lab Results  Component Value Date   WBC 4.9 05/29/2014   NEUTROABS 2.4 05/29/2014   HGB 11.7 05/29/2014   HCT 35.8 05/29/2014   MCV 92.5 05/29/2014   PLT 207 05/29/2014      Chemistry      Component Value Date/Time   NA 142 05/29/2014 1438   NA 137 01/02/2014 1059   K 4.1 05/29/2014 1438   K 4.2 01/02/2014 1059   CL 105 01/02/2014 1059   CO2 25 05/29/2014 1438   CO2 27 01/02/2014 1059   BUN 11.0 05/29/2014 1438   BUN 13 01/02/2014 1059   CREATININE 1.1 05/29/2014 1438   CREATININE 0.9 01/02/2014 1059      Component Value Date/Time   CALCIUM 9.3 05/29/2014 1438   CALCIUM 9.5 01/02/2014 1059   ALKPHOS 53 05/29/2014 1438   ALKPHOS 48 01/02/2014 1059   AST 34 05/29/2014 1438   AST 27 01/02/2014 1059   ALT 24 05/29/2014 1438   ALT 22 01/02/2014 1059   BILITOT 0.28 05/29/2014 1438   BILITOT 0.3 01/02/2014 1059       No results found for: LABCA2  No components found for: LABCA125  No results for input(s): INR in the last 168 hours.  Urinalysis No results found for: COLORURINE  STUDIES: Mm Digital Diagnostic Bilat  05/29/2014   CLINICAL DATA:  74 year old female for annual bilateral mammograms. History of right breast cancer, right lumpectomy and radiation in 2015.  EXAM: DIGITAL DIAGNOSTIC BILATERAL MAMMOGRAM WITH CAD  COMPARISON:  05/28/2013 and prior mammograms dating back to  03/05/2009  ACR Breast Density Category b: There are scattered areas of fibroglandular density.  FINDINGS: A magnification view of the lumpectomy site and routine views of both breasts demonstrate no suspicious mass, nonsurgical distortion or worrisome calcifications.  Scarring and radiation changes within the right breast again noted.  Mammographic images were processed with CAD.  IMPRESSION: No evidence of breast malignancy.  Right breast posttreatment changes.  RECOMMENDATION: Bilateral diagnostic mammograms in 1 year.  I have discussed the findings and recommendations with the patient. Results were also provided in writing at the conclusion of the visit. If applicable, a reminder letter will be sent to the patient regarding the next appointment.  BI-RADS CATEGORY  2: Benign Finding(s)   Electronically Signed   By: Hassan Rowan M.D.   On: 05/29/2014 09:19   CLINICAL DATA: Postmenopausal. History of a total abdominal hysterectomy and bilateral oophorectomy. Patient with Fosamax for 4 years. Also takes calcium.  EXAM: DUAL X-RAY ABSORPTIOMETRY (DXA) FOR BONE MINERAL DENSITY  FINDINGS: AP LUMBAR SPINE L1 through L4  Bone Mineral Density (BMD): 1.071 g/cm2  Young Adult T-Score: 0.2  Z-Score: 1.7  LEFT FEMUR NECK  Bone Mineral Density (BMD): 0.698 g/cm2  Young Adult T-Score: -1.4  Z-Score: -0.3  ASSESSMENT: Patient's diagnostic category is LOW BONE MASS by WHO Criteria.  FRAX: Not assessed, patient being treated for osteoporosis.  COMPARISON: 04/08/2010. Since prior study, there has been a 4.8% increase in bone density in the lumbar spine. There has been no statistically significant change in bone density for the left proximal femur.    ASSESSMENT: 74 y.o. [place] woman status post right breast biopsy 05/14/2013 for a clinical T2 N0, stage IIA invasive ductal carcinoma, grade 2 or 3, estrogen receptor 99% positive, progesterone receptor 50% positive, with an  MIB-1 of 91%, and no HER-2 amplification  (1) 6 mm enhancing right sternal lesion noted by breast MRI 05/20/2013-- negative on PET 06/04/2013  (2) status post right lumpectomy and sentinel lymph node sampling to 07/12/2013 for a pT2 pN0, stage IIA invasive ductal carcinoma, mucinous, grade 2, with repeat HER-2 again negative.  (3) Oncotype score of 18 predicts a 10 year risk of outside the breast recurrence of 12% if the patient's only systemic treatment is tamoxifen for 5 years. It also predicts a minimal benefit from chemotherapy.  (4) adjuvant radiation completed 08/14/2013  (5) anastrozole started 09/06/2013  (a); repeat bone density scan 04/13/2014 showed osteopenia with a T score of -1.4  PLAN: Tinley is tolerating the anastrozole well and obtains it at a very good price. The plan will be to continue that for a total of 5 years.  Her bone density is essentially stable. She has recently increased her calcium and vitamin D intake and we will check a vitamin D level before her next visit here. I also encouraged her to continue her walking program. She is also on alendronate and we again reviewed how to take those medications namely with no other medications or food, just a glass of water, and remain vertical for at least one hour after taking it  I reminded her of our plan to try a splint and her wrist to wear at night to see if that relieves some of the carpal tunnel symptoms times she is experiencing.  Otherwise she will return to see [place] in 6 months and I will see her again in 12 months. She knows to call for any problems that may develop before the next visit here.    Chauncey Cruel, MD   06/05/2014 3:15 PM

## 2014-06-05 NOTE — [facility] Oncology Navigator Note (Signed)
Patient at [facility] for follow up appointment with Dr. Jana Hakim.  Patient did have a recent mammogram which showed no evidence of breast malignancy.  She was questioning whether she should have had a different type of mammogram with her breast cancer history.  I encouraged her to discuss her concern with Dr. Jana Hakim.  Patient denies any other needs at this time.  I encouraged her to call me for any needs.

## 2014-08-24 ENCOUNTER — Other Ambulatory Visit: Payer: Self-pay | Admitting: Oncology

## 2014-08-25 NOTE — Telephone Encounter (Signed)
Last OV 06/05/14. Next OV 12/03/14.  Chart reviewed.

## 2014-09-08 ENCOUNTER — Ambulatory Visit: Payer: Medicare HMO | Admitting: Neurology

## 2014-09-28 ENCOUNTER — Other Ambulatory Visit: Payer: Self-pay | Admitting: Oncology

## 2014-11-25 ENCOUNTER — Other Ambulatory Visit (HOSPITAL_BASED_OUTPATIENT_CLINIC_OR_DEPARTMENT_OTHER): Payer: Medicare PPO

## 2014-11-25 DIAGNOSIS — C50411 Malignant neoplasm of upper-outer quadrant of right female breast: Secondary | ICD-10-CM

## 2014-11-25 DIAGNOSIS — M810 Age-related osteoporosis without current pathological fracture: Secondary | ICD-10-CM | POA: Diagnosis not present

## 2014-11-25 DIAGNOSIS — G4733 Obstructive sleep apnea (adult) (pediatric): Secondary | ICD-10-CM

## 2014-11-25 DIAGNOSIS — M8580 Other specified disorders of bone density and structure, unspecified site: Secondary | ICD-10-CM

## 2014-11-25 DIAGNOSIS — E559 Vitamin D deficiency, unspecified: Secondary | ICD-10-CM | POA: Diagnosis not present

## 2014-11-25 DIAGNOSIS — C50419 Malignant neoplasm of upper-outer quadrant of unspecified female breast: Secondary | ICD-10-CM | POA: Diagnosis not present

## 2014-11-26 ENCOUNTER — Other Ambulatory Visit: Payer: Self-pay | Admitting: Oncology

## 2014-11-26 LAB — VITAMIN D 25 HYDROXY (VIT D DEFICIENCY, FRACTURES): VIT D 25 HYDROXY: 38 ng/mL (ref 30–100)

## 2014-12-03 ENCOUNTER — Ambulatory Visit: Payer: Medicare PPO | Admitting: Nurse Practitioner

## 2014-12-08 ENCOUNTER — Ambulatory Visit (HOSPITAL_BASED_OUTPATIENT_CLINIC_OR_DEPARTMENT_OTHER): Payer: Medicare PPO | Admitting: Nurse Practitioner

## 2014-12-08 ENCOUNTER — Encounter: Payer: Self-pay | Admitting: Nurse Practitioner

## 2014-12-08 ENCOUNTER — Telehealth: Payer: Self-pay | Admitting: Oncology

## 2014-12-08 DIAGNOSIS — M8580 Other specified disorders of bone density and structure, unspecified site: Secondary | ICD-10-CM | POA: Diagnosis not present

## 2014-12-08 DIAGNOSIS — G8929 Other chronic pain: Secondary | ICD-10-CM

## 2014-12-08 DIAGNOSIS — R053 Chronic cough: Secondary | ICD-10-CM | POA: Insufficient documentation

## 2014-12-08 DIAGNOSIS — Z170 Estrogen receptor positive status [ER+]: Secondary | ICD-10-CM

## 2014-12-08 DIAGNOSIS — M79643 Pain in unspecified hand: Secondary | ICD-10-CM

## 2014-12-08 DIAGNOSIS — R05 Cough: Secondary | ICD-10-CM | POA: Insufficient documentation

## 2014-12-08 DIAGNOSIS — Z79811 Long term (current) use of aromatase inhibitors: Secondary | ICD-10-CM

## 2014-12-08 DIAGNOSIS — C50411 Malignant neoplasm of upper-outer quadrant of right female breast: Secondary | ICD-10-CM

## 2014-12-08 NOTE — Progress Notes (Signed)
Monica  [phone] [phone]) [phone]     ID: Monica Diaz OB: Sep 12, 1940  MR#: [id number]  OZH#:[id number]  PCP: Monica Asa, MD GYN:   SU: Monica Diaz OTHER MD: Monica Diaz, Monica Diaz  CHIEF COMPLAINT: Estrogen receptor positive breast cancer  CURRENT TREATMENT: Anastrozole  BREAST CANCER HISTORY: From the original intake note:  Monica Diaz had bilateral screening mammography at the breast center 04/15/2013. This showed a possible mass in the right breast. Diagnostic right mammography and ultrasonography 05/09/2013 showed an irregular area of focal asymmetry in the right breast, which was not palpable. Ultrasound showed dystrophic calcification close to the area of asymmetry. The area of abnormality measured approximately 1.4 cm.  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 HER-2 was reported as negative during the multidisciplinary breast cancer conference 05/22/2013. The written report is pending.   On 05/20/2013 the patient underwent bilateral breast MRI which showed a 2.3 cm irregular enhancing mass in the right breast at the 12:00 position. There were no other areas of concern in either breast and there were no abnormal appearing lymph nodes. However incidentally a 6 mm enhancing right paramedian sternal lesion was noted.  The patient's subsequent history is as detailed below   INTERVAL HISTORY: Monica Diaz returns today for follow up of her breast cancer. She has been on anastrozole since May 2015. She has occasional hot flashes. Her main complaint is bilateral hand cramps, tingling, and burning, especially at night. At her last visit Monica Diaz suggested this was not due to anastrozole but  likely because of carpal tunnel. She was advised to wear wrist splints, but she does not do this regularly. The interval history is generally unremarkable. She continue to walk for exercise and is learning to swim.  REVIEW OF SYSTEMS: Monica Diaz denies fevers, chills, nausea, vomiting, or changes in bowel or bladder habits. She has a chronic dry cough which she attributes to reflux. She has reflux due to Monica Diaz. She has omeprazole but is afraid to take this daily. She does not sleep well at night and has nightmares. She is anxious but denies depression. She has some shooting pains to her breast. She denies shortness of breath, chest pain, cough, or palpitations. A detailed review of systems is otherwise stable.  PAST MEDICAL HISTORY: Past Medical History  Diagnosis Date  . Hyperlipidemia   . Osteoporosis   . Thyroid disease   . Wears glasses   . HOH (hard of hearing)   . Sleep apnea     uses a cpap  . Arthritis   . Cancer     breast  . Seasonal allergies   . Hx of radiation therapy 07/18/13-08/14/13    right breast total dose 50 Gy    PAST SURGICAL HISTORY: Past Surgical History  Procedure Laterality Date  . Abdominal hysterectomy      fibroids  . Left oophorectomy    . Colonoscopy    . Partial mastectomy with needle localization and axillary sentinel lymph node bx Right 05/28/2013    Procedure: PARTIAL MASTECTOMY WITH NEEDLE LOCALIZATION AND AXILLARY SENTINEL LYMPH NODE BIOPSY;  Surgeon: Adin Hector, MD;  Location: [facility];  Service: General;  Laterality: Right;    FAMILY HISTORY Family History  Problem Relation Age of Onset  .  Hypertension Mother   . Diabetes Maternal Aunt   . Stroke      grandfather  The patient knows little about her father. Her mother is living, 42 years old as of January 2015. The patient was an only child. There is no history of breast or ovarian cancer in the family  GYNECOLOGIC HISTORY:  Menarche age 14, first live birth age 73,  the patient is Monica Diaz P2. 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 She is divorced and lives alone with Monica Diaz, who are two nonverbal parakeets. Her daughter Monica Diaz is a housewife in [place]. Her daughter Monica Diaz is a college professor in [employer], teaching urban education. The patient's niece Monica Chase-Gregory MD is a neurologist working at [place]: Not in place. The patient was given the appropriate documents at the time of her first visit here 05/22/2013 to complete and notarize at her discretion. She intends to name her daughter Monica Diaz as her healthcare power of attorney. She can be reached at [phone]   HEALTH MAINTENANCE: Social History  Substance Use Topics  . Smoking status: Never Smoker   . Smokeless tobacco: Not on file  . Alcohol Use: Yes     Comment: occasionally     Colonoscopy:  PAP:  Bone density:04/21/2014 showed mild osteopenia with a T score of -1.4  Lipid panel:  Allergies  Allergen Reactions  . Codeine     REACTION: sick to stomach    Current Outpatient Prescriptions  Medication Sig Dispense Refill  . alendronate (Monica Diaz) 70 MG tablet Take 1 tablet (70 mg total) by mouth once a week. Take with a full glass of water on an empty stomach. 12 tablet 3  . anastrozole (ARIMIDEX) 1 MG tablet TAKE 1 TABLET BY MOUTH EVERY DAY 30 tablet 0  . ARMOUR THYROID 30 MG tablet TAKE 1 TABLET BY MOUTH DAILY 90 tablet 3  . calcium citrate-vitamin D (CITRACAL+D) 315-200 MG-UNIT per tablet Take 1 tablet by mouth 2 (two) times daily.    . Coenzyme Q10 (COQ10) 200 MG CAPS Take 1 capsule by mouth at bedtime.    . CRESTOR 10 MG tablet TAKE 1 TABLET BY MOUTH DAILY 30 tablet 6  . loratadine (CLARITIN) 10 MG tablet Take 10  mg by mouth daily as needed.     . mometasone (NASONEX) 50 MCG/ACT nasal spray Place 2 sprays into the nose daily as needed.    . Multiple Vitamins-Minerals (CENTRUM SILVER PO) Take 1 tablet by mouth.    Marland Kitchen omeprazole (PRILOSEC) 40 MG capsule   3  . OVER THE COUNTER MEDICATION Mega Red Capsules 1-2 per month    . OVER THE COUNTER MEDICATION Take 2 capsules by mouth daily. Collagen Renew Supplement    . vitamin B-12 (CYANOCOBALAMIN) 50 MCG tablet Take 50 mcg by mouth.     No current facility-administered medications for this visit.    OBJECTIVE: Middle-aged African Americanin no acute distress  Filed Vitals:   12/08/14 1101  BP: 127/80  Pulse: 80  Temp: 98.4 F (36.9 C)  Resp: 18     Body mass index is 29.45 kg/(m^2).    ECOG FS:1 - Symptomatic but completely ambulatory  Skin: warm, dry  HEENT: sclerae anicteric, conjunctivae pink, oropharynx clear. No thrush or mucositis.  Lymph Nodes:  No cervical or supraclavicular lymphadenopathy  Lungs: clear to auscultation bilaterally, no rales, wheezes, or rhonci  Heart: regular rate and rhythm  Abdomen: round, soft, non tender, positive bowel sounds  Musculoskeletal: No focal spinal tenderness, no peripheral edema  Neuro: non focal, well oriented, positive affect  Breasts: right breast status post lumpectomy and radiation. Palpable scar tissue and skin thickening, but no evidence of recurrent disease. Right axilla benign. Left breast unremarkable.  LAB RESULTS:  CMP     Component Value Date/Time   NA 142 05/29/2014 1438   NA 137 01/02/2014 1059   K 4.1 05/29/2014 1438   K 4.2 01/02/2014 1059   CL 105 01/02/2014 1059   CO2 25 05/29/2014 1438   CO2 27 01/02/2014 1059   GLUCOSE 92 05/29/2014 1438   GLUCOSE 86 01/02/2014 1059   BUN 11.0 05/29/2014 1438   BUN 13 01/02/2014 1059   CREATININE 1.1 05/29/2014 1438   CREATININE 0.9 01/02/2014 1059   CALCIUM 9.3 05/29/2014 1438   CALCIUM 9.5 01/02/2014 1059   PROT 7.2 05/29/2014 1438    PROT 7.7 01/02/2014 1059   ALBUMIN 3.8 05/29/2014 1438   ALBUMIN 4.0 01/02/2014 1059   AST 34 05/29/2014 1438   AST 27 01/02/2014 1059   ALT 24 05/29/2014 1438   ALT 22 01/02/2014 1059   ALKPHOS 53 05/29/2014 1438   ALKPHOS 48 01/02/2014 1059   BILITOT 0.28 05/29/2014 1438   BILITOT 0.3 01/02/2014 1059   GFRNONAA 66.33 03/25/2010 0828    I No results found for: SPEP  Lab Results  Component Value Date   WBC 4.9 05/29/2014   NEUTROABS 2.4 05/29/2014   HGB 11.7 05/29/2014   HCT 35.8 05/29/2014   MCV 92.5 05/29/2014   PLT 207 05/29/2014      Chemistry      Component Value Date/Time   NA 142 05/29/2014 1438   NA 137 01/02/2014 1059   K 4.1 05/29/2014 1438   K 4.2 01/02/2014 1059   CL 105 01/02/2014 1059   CO2 25 05/29/2014 1438   CO2 27 01/02/2014 1059   BUN 11.0 05/29/2014 1438   BUN 13 01/02/2014 1059   CREATININE 1.1 05/29/2014 1438   CREATININE 0.9 01/02/2014 1059      Component Value Date/Time   CALCIUM 9.3 05/29/2014 1438   CALCIUM 9.5 01/02/2014 1059   ALKPHOS 53 05/29/2014 1438   ALKPHOS 48 01/02/2014 1059   AST 34 05/29/2014 1438   AST 27 01/02/2014 1059   ALT 24 05/29/2014 1438   ALT 22 01/02/2014 1059   BILITOT 0.28 05/29/2014 1438   BILITOT 0.3 01/02/2014 1059       No results found for: LABCA2  No components found for: LABCA125  No results for input(s): INR in the last 168 hours.  Urinalysis No results found for: COLORURINE  STUDIES: No results found. CLINICAL DATA: Postmenopausal. History of a total abdominal hysterectomy and bilateral oophorectomy. Patient with Monica Diaz for 4 years. Also takes calcium.  EXAM: DUAL X-RAY ABSORPTIOMETRY (DXA) FOR BONE MINERAL DENSITY  FINDINGS: AP LUMBAR SPINE L1 through L4  Bone Mineral Density (BMD): 1.071 g/cm2  Young Adult T-Score: 0.2  Z-Score: 1.7  LEFT FEMUR NECK  Bone Mineral Density (BMD): 0.698 g/cm2  Young Adult T-Score: -1.4  Z-Score: -0.3  ASSESSMENT:  Patient's diagnostic category is LOW BONE MASS by WHO Criteria.  FRAX: Not assessed, patient being treated for osteoporosis.  COMPARISON: 04/08/2010. Since prior study, there has been a 4.8% increase in bone density  in the lumbar spine. There has been no statistically significant change in bone density for the left proximal femur.    ASSESSMENT: 74 y.o. [place] Diaz status post right breast biopsy 05/14/2013 for a clinical T2 N0, stage IIA invasive ductal carcinoma, grade 2 or 3, estrogen receptor 99% positive, progesterone receptor 50% positive, with an MIB-1 of 91%, and no HER-2 amplification  (1) 6 mm enhancing right sternal lesion noted by breast MRI 05/20/2013-- negative on PET 06/04/2013  (2) status post right lumpectomy and sentinel lymph node sampling to 07/12/2013 for a pT2 pN0, stage IIA invasive ductal carcinoma, mucinous, grade 2, with repeat HER-2 again negative.  (3) Oncotype score of 18 predicts a 10 year risk of outside the breast recurrence of 12% if the patient's only systemic treatment is tamoxifen for 5 years. It also predicts a minimal benefit from chemotherapy.  (4) adjuvant radiation completed 08/14/2013  (5) anastrozole started 09/06/2013  (a); repeat bone density scan 04/13/2014 showed osteopenia with a T score of -1.4  PLAN: Monica Diaz is doing well today. The labs were reviewed in detail and were entirely stable. She has no evidence of recurrent disease. She is tolerating the anastrozole well, and is expected to complete 5 years of antiestrogen therapy.   I believe the source of pain and burning to her hands not actually the anastrozole, but from carpal tunnel. She is not entirely compliant with wearing her wrist splints. She is interested in visiting a hand specialist, so I am referring her to one today.   We discussed her bone density scan and Monica Diaz use. She would like to come off of the drug, due to the chronic cough that she attributes to  heartburn, though she does not actually experience the "burn" part. Because her t-score showed a significant improvement, and her vitamin D level is optimized through supplementation, she will stop the Monica Diaz. We will recheck a bone density scan next year, and if she is increasingly osteopenic, we may substitute with zometa instead.   Monica Diaz will return in 6 months for labs and a follow up visit. She understands and agrees with this plan. She knows the goal of treatment in her case is cure. She has been encouraged to call with any issues that might arise before her next visit here.   Laurie Panda, NP   12/08/2014 1:39 PM

## 2014-12-08 NOTE — Telephone Encounter (Signed)
Appointments made and avs printed for patient °

## 2014-12-15 ENCOUNTER — Other Ambulatory Visit: Payer: Self-pay | Admitting: Oncology

## 2014-12-17 ENCOUNTER — Other Ambulatory Visit: Payer: Self-pay | Admitting: Family Medicine

## 2014-12-17 NOTE — Telephone Encounter (Signed)
Med filled #30 per PCP.

## 2014-12-17 NOTE — Telephone Encounter (Signed)
[facility] for #30, needs f/u OV

## 2014-12-17 NOTE — Telephone Encounter (Signed)
Last OV 01/02/14 [facility] last filled 07/27/13 #30 with 11  Last TSH 01-02-14, no upcoming appts

## 2014-12-18 ENCOUNTER — Telehealth: Payer: Self-pay | Admitting: *Deleted

## 2014-12-18 NOTE — Telephone Encounter (Signed)
  Oncology Nurse Navigator Documentation    Navigator Encounter Type: Telephone (12/18/14 1600) - Called patient to check in.  Left voicemail with request for a return call and contact information.   Treatment Phase: Other (Follow up contact) (12/18/14 1600)

## 2014-12-28 ENCOUNTER — Other Ambulatory Visit: Payer: Self-pay | Admitting: Family Medicine

## 2014-12-30 NOTE — Telephone Encounter (Signed)
Medication filled to pharmacy as requested.   

## 2015-01-11 ENCOUNTER — Other Ambulatory Visit: Payer: Self-pay | Admitting: Family Medicine

## 2015-01-12 ENCOUNTER — Other Ambulatory Visit: Payer: Self-pay | Admitting: Family Medicine

## 2015-01-12 ENCOUNTER — Other Ambulatory Visit: Payer: Self-pay | Admitting: Oncology

## 2015-01-12 NOTE — Telephone Encounter (Signed)
Med filled.  

## 2015-01-12 NOTE — Telephone Encounter (Signed)
Medication filled to pharmacy as requested.   

## 2015-02-11 ENCOUNTER — Other Ambulatory Visit: Payer: Self-pay | Admitting: Family Medicine

## 2015-02-11 NOTE — Telephone Encounter (Signed)
Medication filled to pharmacy as requested. Pt needs an appt for any additional refills.

## 2015-02-16 DIAGNOSIS — M79642 Pain in left hand: Secondary | ICD-10-CM | POA: Diagnosis not present

## 2015-02-16 DIAGNOSIS — G5603 Carpal tunnel syndrome, bilateral upper limbs: Secondary | ICD-10-CM | POA: Diagnosis not present

## 2015-02-16 DIAGNOSIS — M79641 Pain in right hand: Secondary | ICD-10-CM | POA: Diagnosis not present

## 2015-02-16 DIAGNOSIS — M65331 Trigger finger, right middle finger: Secondary | ICD-10-CM | POA: Diagnosis not present

## 2015-02-25 ENCOUNTER — Encounter (INDEPENDENT_AMBULATORY_CARE_PROVIDER_SITE_OTHER): Payer: Self-pay

## 2015-02-25 ENCOUNTER — Ambulatory Visit (INDEPENDENT_AMBULATORY_CARE_PROVIDER_SITE_OTHER): Payer: Medicare PPO | Admitting: Family Medicine

## 2015-02-25 ENCOUNTER — Encounter: Payer: Self-pay | Admitting: Family Medicine

## 2015-02-25 ENCOUNTER — Telehealth: Payer: Self-pay | Admitting: Family Medicine

## 2015-02-25 VITALS — BP 124/84 | HR 69 | Temp 97.9°F | Resp 16 | Ht 64.0 in | Wt 165.2 lb

## 2015-02-25 DIAGNOSIS — M810 Age-related osteoporosis without current pathological fracture: Secondary | ICD-10-CM | POA: Diagnosis not present

## 2015-02-25 DIAGNOSIS — E039 Hypothyroidism, unspecified: Secondary | ICD-10-CM

## 2015-02-25 DIAGNOSIS — E785 Hyperlipidemia, unspecified: Secondary | ICD-10-CM

## 2015-02-25 DIAGNOSIS — R208 Other disturbances of skin sensation: Secondary | ICD-10-CM | POA: Diagnosis not present

## 2015-02-25 DIAGNOSIS — R200 Anesthesia of skin: Secondary | ICD-10-CM

## 2015-02-25 DIAGNOSIS — E559 Vitamin D deficiency, unspecified: Secondary | ICD-10-CM

## 2015-02-25 DIAGNOSIS — G4752 REM sleep behavior disorder: Secondary | ICD-10-CM

## 2015-02-25 LAB — LIPID PANEL
CHOLESTEROL: 146 mg/dL (ref 0–200)
HDL: 70.8 mg/dL (ref >39.00)
LDL CALC: 64 mg/dL (ref 0–99)
NonHDL: 75.17
Total CHOL/HDL Ratio: 2
Triglycerides: 57 mg/dL (ref 0.0–149.0)
VLDL: 11.4 mg/dL (ref 0.0–40.0)

## 2015-02-25 LAB — CBC WITH DIFFERENTIAL/PLATELET
BASOS ABS: 0 10*3/uL (ref 0.0–0.1)
BASOS PCT: 0.2 % (ref 0.0–3.0)
Eosinophils Absolute: 0.1 10*3/uL (ref 0.0–0.7)
Eosinophils Relative: 1.6 % (ref 0.0–5.0)
HEMATOCRIT: 38 % (ref 36.0–46.0)
Hemoglobin: 12.3 g/dL (ref 12.0–15.0)
LYMPHS PCT: 36.1 % (ref 12.0–46.0)
Lymphs Abs: 1.7 10*3/uL (ref 0.7–4.0)
MCHC: 32.4 g/dL (ref 30.0–36.0)
MCV: 92.4 fl (ref 78.0–100.0)
MONOS PCT: 11.7 % (ref 3.0–12.0)
Monocytes Absolute: 0.5 10*3/uL (ref 0.1–1.0)
NEUTROS ABS: 2.3 10*3/uL (ref 1.4–7.7)
Neutrophils Relative %: 50.4 % (ref 43.0–77.0)
PLATELETS: 260 10*3/uL (ref 150.0–400.0)
RBC: 4.12 Mil/uL (ref 3.87–5.11)
RDW: 15.8 % — ABNORMAL HIGH (ref 11.5–15.5)
WBC: 4.6 10*3/uL (ref 4.0–10.5)

## 2015-02-25 LAB — BASIC METABOLIC PANEL
BUN: 12 mg/dL (ref 6–23)
CALCIUM: 9.5 mg/dL (ref 8.4–10.5)
CO2: 29 mEq/L (ref 19–32)
CREATININE: 0.85 mg/dL (ref 0.40–1.20)
Chloride: 105 mEq/L (ref 96–112)
GFR: 84.37 mL/min (ref >60.00)
GLUCOSE: 92 mg/dL (ref 70–99)
Potassium: 3.8 mEq/L (ref 3.5–5.1)
SODIUM: 138 meq/L (ref 135–145)

## 2015-02-25 LAB — VITAMIN D 25 HYDROXY (VIT D DEFICIENCY, FRACTURES): VITD: 37.6 ng/mL (ref 30.00–100.00)

## 2015-02-25 LAB — B12 AND FOLATE PANEL
FOLATE: 23.2 ng/mL (ref >5.9)
Vitamin B-12: 489 pg/mL (ref 211–911)

## 2015-02-25 LAB — HEPATIC FUNCTION PANEL
ALBUMIN: 4 g/dL (ref 3.5–5.2)
ALK PHOS: 39 U/L (ref 39–117)
ALT: 15 U/L (ref 0–35)
AST: 18 U/L (ref 0–37)
Bilirubin, Direct: 0.1 mg/dL (ref 0.0–0.3)
TOTAL PROTEIN: 7.6 g/dL (ref 6.0–8.3)
Total Bilirubin: 0.4 mg/dL (ref 0.2–1.2)

## 2015-02-25 LAB — TSH: TSH: 3.31 u[IU]/mL (ref 0.35–4.50)

## 2015-02-25 MED ORDER — ALENDRONATE SODIUM 70 MG PO TABS: ORAL_TABLET | ORAL | Status: DC

## 2015-02-25 NOTE — Patient Instructions (Signed)
Schedule your complete physical in 6 months We'll notify you of your lab results and make any changes if needed Wear wrist splints at night to help improve carpal tunnel and numbness Restart Calcium and Vit D daily Drink plenty of water to improve bowel regularity and use OTC Miralax as needed Try wearing your CPAP at night and see if the sleep disturbances improve.  If not, we'll send you back to a sleep specialist Call with any questions or concerns If you want to join [place] at the new [place] office, any scheduled appointments will automatically transfer and we will see you at [address] [place] [address] [place], [place] [address]  Happy Holidays!!!

## 2015-02-25 NOTE — Progress Notes (Signed)
Pre visit review using our clinic review tool, if applicable. No additional management support is needed unless otherwise documented below in the visit note. 

## 2015-02-25 NOTE — Telephone Encounter (Signed)
Patient does not want to wait until February when she sees Dr. Sarajane Jews as she was advised to have her pneumonia shot. Patient would like to be notified when pneumonia vaccination is in office.

## 2015-02-25 NOTE — Progress Notes (Signed)
   Subjective:    Patient ID: Monica Diaz, female    DOB: 1940/11/09, 74 y.o.   MRN: [id number]  HPI Hyperlipidemia- chronic problem, on Crestor.  Denies CP, SOB, HAs, visual changes, abd pain, N/V, myalgias.  Hypothyroid- chronic problem, on [facility] thyroid.  + neuropathy of hands bilaterally.  Denies fatigue, changes to skin/hair/nails.  Osteoporosis- chronic problem, UTD on DEXA.  On Fosamax weekly.  Due for Vit D level  Hand pain- bilateral, R>L.  Pt having burning from palm to tips of fingers.  Burning is worse at night.  Pt seeing hand specialist who told her they cannot pinpoint a cause.  Pt is concerned about thyroid, diabetes.  Sleep disturbance- pt reports she is not wearing CPAP, 'it's not my breathing'.  Pt reports family tells her that she will scream out, flail around in her sleep.   Review of Systems For ROS see HPI     Objective:   Physical Exam  Constitutional: She is oriented to person, place, and time. She appears well-developed and well-nourished. No distress.  HENT:  Head: Normocephalic and atraumatic.  Eyes: Conjunctivae and EOM are normal. Pupils are equal, round, and reactive to light.  Neck: Normal range of motion. Neck supple. No thyromegaly present.  Cardiovascular: Normal rate, regular rhythm, normal heart sounds and intact distal pulses.   No murmur heard. Pulmonary/Chest: Effort normal and breath sounds normal. No respiratory distress.  Abdominal: Soft. She exhibits no distension. There is no tenderness.  Musculoskeletal: She exhibits no edema.  Lymphadenopathy:    She has no cervical adenopathy.  Neurological: She is alert and oriented to person, place, and time.  Skin: Skin is warm and dry.  Psychiatric: She has a normal mood and affect. Her behavior is normal.  Vitals reviewed.         Assessment & Plan:

## 2015-02-25 NOTE — Telephone Encounter (Signed)
Noted  

## 2015-02-26 NOTE — Telephone Encounter (Signed)
Patient scheduled for 02/27/2015 nurse visit only

## 2015-02-26 NOTE — Telephone Encounter (Signed)
We received Pneumovax 23 in today. Can you call pt and schedule her a nurse visit for this vaccination

## 2015-02-27 ENCOUNTER — Ambulatory Visit (INDEPENDENT_AMBULATORY_CARE_PROVIDER_SITE_OTHER): Payer: Medicare PPO

## 2015-02-27 DIAGNOSIS — Z23 Encounter for immunization: Secondary | ICD-10-CM | POA: Diagnosis not present

## 2015-02-27 NOTE — Assessment & Plan Note (Signed)
Chronic problem.  Check labs.  Replete prn. 

## 2015-02-27 NOTE — Assessment & Plan Note (Signed)
Chronic problem.  Tolerating medication w/o difficulty.  Asymptomatic w/ exception of neuropathy of hands bilaterally.  Check labs.  Adjust meds prn

## 2015-02-27 NOTE — Assessment & Plan Note (Signed)
New.  May be related to her breast cancer chemo medication.  Already seeing ortho for trigger fingers, arthritis.  May have a component of carpal tunnel.  Check labs to r/o metabolic cause.  Will follow.

## 2015-02-27 NOTE — Assessment & Plan Note (Signed)
Chronic problem.  Tolerating statin w/o difficulty.  Check labs.  Adjust meds prn  

## 2015-02-27 NOTE — Assessment & Plan Note (Signed)
Stressed need for pt to wear CPAP to see if sleep disturbances improve.  Explained that while her sxs don't involve her breathing, if she is not able to get into deep REM sleep this may explain her thrashing about.  Pt to attempt CPAP nightly and if no improvement, we'll refer back to [facility].  Pt expressed understanding and is in agreement w/ plan.

## 2015-02-27 NOTE — Assessment & Plan Note (Signed)
Chronic problem.  UTD on DEXA.  Check Vit D.  Refill Fosamax.

## 2015-03-02 ENCOUNTER — Telehealth: Payer: Self-pay | Admitting: Oncology

## 2015-03-02 ENCOUNTER — Telehealth: Payer: Self-pay | Admitting: *Deleted

## 2015-03-02 NOTE — Telephone Encounter (Signed)
  Oncology Nurse Navigator Documentation    Navigator Encounter Type: Telephone (03/02/15 1100)   Treatment Phase: Other (Surveillance on Anasrozole) (03/02/15 1100)     Interventions: Medication assitance;Coordination of Care (03/02/15 1100)  Patient called and reported that she continues to have severe burning pain and stiffness in her right hand with inability to make a fist.  She also has stiffness in her left hand.  She c/o of similar symptoms when she saw Dr. Jana Hakim and Gentry Fitz, NP.  It was felt that her symptoms are probably due to carpal tunnel and not her medication.  She was referred to an orthopedist who has done cortisone injections in her right hand which has resulted in improvement of the pain in her palm but continued burning pain in her fingers.  She calls today after seeing her PCP who suggested her anastrozole might be causing her symptoms and also to ask if it is OK to proceed with another cortisone injection in her hand.  I spoke to Dr. Jana Hakim who said it is OK for her to proceed with the cortisone injections and for her to stop taking her anastrozole for 2 months and to then follow up with Heather.  Patient already scheduled to see Dr. Jana Hakim in 3 months and she requests that I reschedule that appointment for 2 months.  Patient states that her anastrozole pills, in her most recent Rx, look a little different.  I encouraged her to follow up with her pharmacist.  Patient able to teach back information.  Patient denied any other questions or concerns at this time.  I encouraged her to call me for any other needs.            Time Spent with Patient: 15 (03/02/15 1100)

## 2015-03-02 NOTE — Telephone Encounter (Signed)
Called patient with sooner appointment per pof

## 2015-03-03 DIAGNOSIS — M65321 Trigger finger, right index finger: Secondary | ICD-10-CM | POA: Diagnosis not present

## 2015-03-03 DIAGNOSIS — M65331 Trigger finger, right middle finger: Secondary | ICD-10-CM | POA: Diagnosis not present

## 2015-03-07 ENCOUNTER — Other Ambulatory Visit: Payer: Self-pay | Admitting: Family Medicine

## 2015-03-09 ENCOUNTER — Telehealth: Payer: Self-pay | Admitting: *Deleted

## 2015-03-09 DIAGNOSIS — M65321 Trigger finger, right index finger: Secondary | ICD-10-CM | POA: Diagnosis not present

## 2015-03-09 DIAGNOSIS — M65331 Trigger finger, right middle finger: Secondary | ICD-10-CM | POA: Diagnosis not present

## 2015-03-09 NOTE — Telephone Encounter (Signed)
Patient called to let us know that she had a nerve study performed by Dr. 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  Patient to follow up with orthopedist to discuss surgery.  Patient also asked me to reschedule her appointment with Dr. Jana Hakim since she will be out of town. I encouraged her to call me for any other needs.

## 2015-03-09 NOTE — Telephone Encounter (Signed)
Medication filled to pharmacy as requested.   

## 2015-03-10 ENCOUNTER — Telehealth: Payer: Self-pay | Admitting: Oncology

## 2015-03-10 NOTE — Telephone Encounter (Signed)
Called and left a message with new appointment per pof °

## 2015-03-28 ENCOUNTER — Other Ambulatory Visit: Payer: Self-pay | Admitting: Family Medicine

## 2015-03-30 DIAGNOSIS — H5203 Hypermetropia, bilateral: Secondary | ICD-10-CM | POA: Diagnosis not present

## 2015-03-30 DIAGNOSIS — H2513 Age-related nuclear cataract, bilateral: Secondary | ICD-10-CM | POA: Diagnosis not present

## 2015-03-30 DIAGNOSIS — H524 Presbyopia: Secondary | ICD-10-CM | POA: Diagnosis not present

## 2015-03-30 DIAGNOSIS — H52223 Regular astigmatism, bilateral: Secondary | ICD-10-CM | POA: Diagnosis not present

## 2015-03-30 NOTE — Telephone Encounter (Signed)
Medication filled to pharmacy as requested.   

## 2015-04-01 DIAGNOSIS — M65312 Trigger thumb, left thumb: Secondary | ICD-10-CM | POA: Diagnosis not present

## 2015-04-01 DIAGNOSIS — G5603 Carpal tunnel syndrome, bilateral upper limbs: Secondary | ICD-10-CM | POA: Diagnosis not present

## 2015-04-30 ENCOUNTER — Other Ambulatory Visit: Payer: Self-pay | Admitting: Oncology

## 2015-04-30 DIAGNOSIS — Z9889 Other specified postprocedural states: Secondary | ICD-10-CM

## 2015-04-30 DIAGNOSIS — Z853 Personal history of malignant neoplasm of breast: Secondary | ICD-10-CM

## 2015-05-05 ENCOUNTER — Ambulatory Visit: Payer: Medicare PPO | Admitting: Oncology

## 2015-05-05 ENCOUNTER — Other Ambulatory Visit: Payer: Medicare PPO

## 2015-05-19 ENCOUNTER — Telehealth: Payer: Self-pay | Admitting: *Deleted

## 2015-05-19 NOTE — Telephone Encounter (Signed)
  Oncology Nurse Navigator Documentation  Navigator Location: [facility] (05/19/15 1228) Navigator Encounter Type: Telephone (05/19/15 1228)  I called patient to check in.  She reports that she is doing well.  I asked whether she has had surgery on her hand for carpal tunnel and she reports that she is scheduled for 06/16/15.  I reminded her of her appointment with Dr. Jana Hakim this week and mammogram in February.  She will discuss her upcoming surgery with Dr. Jana Hakim at her appointment to make sure he has no instructions or concerns with her proceeding with the surgery since it is the same side as her partial mastectomy and axillary sentinel lymph node biopsy.  She denied any questions or concerns at this time.  I encouraged her to call me for any needs.           Treatment Phase: Follow-up (05/19/15 1228) Barriers/Navigation Needs: No Questions;No Needs (05/19/15 1228)   Interventions: None required (05/19/15 1228)            Acuity: Level 1 (05/19/15 1228)         Time Spent with Patient: 15 (05/19/15 1228)

## 2015-05-21 ENCOUNTER — Other Ambulatory Visit (HOSPITAL_BASED_OUTPATIENT_CLINIC_OR_DEPARTMENT_OTHER): Payer: 59

## 2015-05-21 ENCOUNTER — Ambulatory Visit (HOSPITAL_BASED_OUTPATIENT_CLINIC_OR_DEPARTMENT_OTHER): Payer: 59 | Admitting: Oncology

## 2015-05-21 ENCOUNTER — Telehealth: Payer: Self-pay | Admitting: Nurse Practitioner

## 2015-05-21 VITALS — BP 108/73 | HR 78 | Temp 98.0°F | Resp 18 | Ht 64.0 in | Wt 168.3 lb

## 2015-05-21 DIAGNOSIS — C50811 Malignant neoplasm of overlapping sites of right female breast: Secondary | ICD-10-CM

## 2015-05-21 DIAGNOSIS — C50411 Malignant neoplasm of upper-outer quadrant of right female breast: Secondary | ICD-10-CM

## 2015-05-21 DIAGNOSIS — Z170 Estrogen receptor positive status [ER+]: Secondary | ICD-10-CM | POA: Diagnosis not present

## 2015-05-21 DIAGNOSIS — Z79811 Long term (current) use of aromatase inhibitors: Secondary | ICD-10-CM

## 2015-05-21 DIAGNOSIS — M8580 Other specified disorders of bone density and structure, unspecified site: Secondary | ICD-10-CM | POA: Diagnosis not present

## 2015-05-21 DIAGNOSIS — I890 Lymphedema, not elsewhere classified: Secondary | ICD-10-CM | POA: Diagnosis not present

## 2015-05-21 LAB — CBC WITH DIFFERENTIAL/PLATELET
BASO%: 1 % (ref 0.0–2.0)
Basophils Absolute: 0 10*3/uL (ref 0.0–0.1)
EOS%: 2.4 % (ref 0.0–7.0)
Eosinophils Absolute: 0.1 10*3/uL (ref 0.0–0.5)
HCT: 35.9 % (ref 34.8–46.6)
HGB: 11.8 g/dL (ref 11.6–15.9)
LYMPH%: 30.3 % (ref 14.0–49.7)
MCH: 30.3 pg (ref 25.1–34.0)
MCHC: 32.9 g/dL (ref 31.5–36.0)
MCV: 91.9 fL (ref 79.5–101.0)
MONO#: 0.7 10*3/uL (ref 0.1–0.9)
MONO%: 14 % (ref 0.0–14.0)
NEUT#: 2.6 10*3/uL (ref 1.5–6.5)
NEUT%: 52.3 % (ref 38.4–76.8)
PLATELETS: 223 10*3/uL (ref 145–400)
RBC: 3.91 10*6/uL (ref 3.70–5.45)
RDW: 15.1 % — ABNORMAL HIGH (ref 11.2–14.5)
WBC: 5.1 10*3/uL (ref 3.9–10.3)
lymph#: 1.5 10*3/uL (ref 0.9–3.3)

## 2015-05-21 LAB — COMPREHENSIVE METABOLIC PANEL
ALT: 15 U/L (ref 0–55)
ANION GAP: 6 meq/L (ref 3–11)
AST: 21 U/L (ref 5–34)
Albumin: 3.6 g/dL (ref 3.5–5.0)
Alkaline Phosphatase: 44 U/L (ref 40–150)
BUN: 10.6 mg/dL (ref 7.0–26.0)
CHLORIDE: 107 meq/L (ref 98–109)
CO2: 28 meq/L (ref 22–29)
CREATININE: 1 mg/dL (ref 0.6–1.1)
Calcium: 9.3 mg/dL (ref 8.4–10.4)
EGFR: 67 mL/min/{1.73_m2} — ABNORMAL LOW (ref >90)
Glucose: 86 mg/dl (ref 70–140)
POTASSIUM: 4.2 meq/L (ref 3.5–5.1)
Sodium: 141 mEq/L (ref 136–145)
Total Bilirubin: 0.38 mg/dL (ref 0.20–1.20)
Total Protein: 7.3 g/dL (ref 6.4–8.3)

## 2015-05-21 MED ORDER — ANASTROZOLE 1 MG PO TABS: 1.0000 mg | ORAL_TABLET | Freq: Every day | ORAL | Status: DC

## 2015-05-21 NOTE — Progress Notes (Signed)
[place]  [phone] Fax:([phone]) [phone]     ID: Monica Diaz OB: 08-02-1940  MR#: [id number]  IYM#:[id number]  PCP: Annye Asa, MD GYN:   SU: [name] OTHER MD: Jaquita Folds Dohmier, [name], [place] Gramig  CHIEF COMPLAINT: Estrogen receptor positive breast cancer  CURRENT TREATMENT: Anastrozole  BREAST CANCER HISTORY: From the original intake note:  Monica Diaz had bilateral screening mammography at the breast center 04/15/2013. This showed a possible mass in the right breast. Diagnostic right mammography and ultrasonography 05/09/2013 showed an irregular area of focal asymmetry in the right breast, which was not palpable. Ultrasound showed dystrophic calcification close to the area of asymmetry. The area of abnormality measured approximately 1.4 cm.  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 HER-2 was reported as negative during the multidisciplinary breast cancer conference 05/22/2013. The written report is pending.   On 05/20/2013 the patient underwent bilateral breast MRI which showed a 2.3 cm irregular enhancing mass in the right breast at the 12:00 position. There were no other areas of concern in either breast and there were no abnormal appearing lymph nodes. However incidentally a 6 mm enhancing right paramedian sternal lesion was noted.  The patient's subsequent history is as detailed below   INTERVAL HISTORY: Monica Diaz returns today for follow up of her estrogen receptor positive breast cancer. We had stopped the anastrozole because she was concerned that might be causing neuropathy. However her symptoms did not improve at all. She continues to have significant loss of function in her right hand,  with pain and discomfort which is worse at night and early in the morning. She's been evaluated by Dr. Jeannie Fend and has been found to have significant carpal tunnel bilaterally. She is going to have release surgery 06/15/2014 on the right, possibly on the left to follow at a later date  REVIEW OF SYSTEMS: Monica Diaz Has grade 1 right upper extremity lymphedema, but is not wearing her sleeve. She is not doing the massage. Aside from the pain in her hands, she has problems with her hearing. There is some sinus issues. Her dentures do not fit well. She sleeps on 2 pillows. She keeps a dry cough and has some reflux symptoms which she thinks are aggravated when she takes the alendronate. She bruises easily. She has chronic low back pain which is not more intense or persistent than before. Otherwise a detailed review of systems today was stable.  PAST MEDICAL HISTORY: Past Medical History  Diagnosis Date  . Hyperlipidemia   . Osteoporosis   . Thyroid disease   . Wears glasses   . HOH (hard of hearing)   . Sleep apnea     uses a cpap  . Arthritis   . Cancer (HCC)     breast  . Seasonal allergies   . Hx of radiation therapy 07/18/13-08/14/13    right breast total dose 50 Gy    PAST SURGICAL HISTORY: Past Surgical History  Procedure Laterality Date  . Abdominal hysterectomy      fibroids  . Left oophorectomy    . Colonoscopy    . Partial mastectomy with needle localization and axillary sentinel lymph node bx Right 05/28/2013    Procedure: PARTIAL MASTECTOMY WITH NEEDLE LOCALIZATION AND AXILLARY SENTINEL LYMPH NODE BIOPSY;  Surgeon:  Adin Hector, MD;  Location: [facility];  Service: General;  Laterality: Right;    FAMILY HISTORY Family History  Problem Relation Age of Onset  . Hypertension Mother   . Diabetes Maternal Aunt   . Stroke      grandfather  The patient knows little about her father. Her mother is living, 49 years old as of January 2015. The patient was an only  child. There is no history of breast or ovarian cancer in the family  GYNECOLOGIC HISTORY:  Menarche age 40, first live birth age 19, the patient is [place] P2. 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 She is divorced and lives alone with [name], who are two nonverbal parakeets. Her daughter Monica Diaz is a housewife in [place]. Her daughter Monica Diaz is a college professor in [facility], teaching urban education. The patient's niece Monica Chase-Gregory MD is a neurologist working at [place]: Not in place. The patient was given the appropriate documents at the time of her first visit here 05/22/2013 to complete and notarize at her discretion. She intends to name her daughter [name] as her healthcare power of attorney. She can be reached at [phone]   HEALTH MAINTENANCE: Social History  Substance Use Topics  . Smoking status: Never Smoker   . Smokeless tobacco: Not on file  . Alcohol Use: Yes     Comment: occasionally     Colonoscopy:  PAP:  Bone density:04/21/2014 showed mild osteopenia with a T score of -1.4  Lipid panel:  Allergies  Allergen Reactions  . Codeine     REACTION: sick to stomach    Current Outpatient Prescriptions  Medication Sig Dispense Refill  . alendronate (FOSAMAX) 70 MG tablet TAKE 1 TABLET BY MOUTH EVERY WEEK WITH FULL GLASS OF WATER ON AN EPMTY STOMACH 12 tablet 2  . anastrozole (ARIMIDEX) 1 MG tablet TAKE 1 TABLET BY MOUTH EVERY DAY 30 tablet 3  . ARMOUR THYROID 30 MG tablet TAKE 1 TABLET BY MOUTH EVERY DAY 30 tablet 6  . calcium citrate-vitamin D (CITRACAL+D) 315-200 MG-UNIT per tablet Take 1 tablet by mouth 2 (two) times daily.    . Coenzyme Q10 (COQ10) 200 MG CAPS Take 1 capsule by mouth at  bedtime.    Marland Kitchen loratadine (CLARITIN) 10 MG tablet Take 10 mg by mouth daily as needed.     . mometasone (NASONEX) 50 MCG/ACT nasal spray Place 2 sprays into the nose daily as needed.    . Multiple Vitamins-Minerals (CENTRUM SILVER PO) Take 1 tablet by mouth.    Marland Kitchen omeprazole (PRILOSEC) 40 MG capsule   3  . OVER THE COUNTER MEDICATION Mega Red Capsules 1-2 per month    . OVER THE COUNTER MEDICATION Take 2 capsules by mouth daily. Collagen Renew Supplement    . rosuvastatin (CRESTOR) 10 MG tablet Take 1 tablet (10 mg total) by mouth daily. 30 tablet 6  . vitamin B-12 (CYANOCOBALAMIN) 50 MCG tablet Take 50 mcg by mouth.     No current facility-administered medications for this visit.    OBJECTIVE: Middle-aged [place] American woman who appears stated age  62 Vitals:   05/21/15 1104  BP: 108/73  Pulse: 78  Temp: 98 F (36.7 C)  Resp: 18     Body mass index  is 28.87 kg/(m^2).    ECOG FS:1 - Symptomatic but completely ambulatory  Sclerae unicteric, pupils round and equal Oropharynx clear and moist-- no thrush or other lesions No cervical or supraclavicular adenopathy Lungs no rales or rhonchi Heart regular rate and rhythm Abd soft, nontender, positive bowel sounds MSK no focal spinal tenderness, grade 1 right upper extremity lymphedema Neuro: nonfocal, well oriented, appropriate affect Breasts: the right breast is status post lumpectomy and radiation. There is some edema and hyperpigmentation persisting. There is no evidence of local recurrence. The right axilla is benign. Left breast is unremarkable   LAB RESULTS:  CMP     Component Value Date/Time   NA 138 02/25/2015 1004   NA 142 05/29/2014 1438   K 3.8 02/25/2015 1004   K 4.1 05/29/2014 1438   CL 105 02/25/2015 1004   CO2 29 02/25/2015 1004   CO2 25 05/29/2014 1438   GLUCOSE 92 02/25/2015 1004   GLUCOSE 92 05/29/2014 1438   BUN 12 02/25/2015 1004   BUN 11.0 05/29/2014 1438   CREATININE 0.85 02/25/2015 1004    CREATININE 1.1 05/29/2014 1438   CALCIUM 9.5 02/25/2015 1004   CALCIUM 9.3 05/29/2014 1438   PROT 7.6 02/25/2015 1004   PROT 7.2 05/29/2014 1438   ALBUMIN 4.0 02/25/2015 1004   ALBUMIN 3.8 05/29/2014 1438   AST 18 02/25/2015 1004   AST 34 05/29/2014 1438   ALT 15 02/25/2015 1004   ALT 24 05/29/2014 1438   ALKPHOS 39 02/25/2015 1004   ALKPHOS 53 05/29/2014 1438   BILITOT 0.4 02/25/2015 1004   BILITOT 0.28 05/29/2014 1438   GFRNONAA 66.33 03/25/2010 0828    I No results found for: SPEP  Lab Results  Component Value Date   WBC 5.1 05/21/2015   NEUTROABS 2.6 05/21/2015   HGB 11.8 05/21/2015   HCT 35.9 05/21/2015   MCV 91.9 05/21/2015   PLT 223 05/21/2015      Chemistry      Component Value Date/Time   NA 138 02/25/2015 1004   NA 142 05/29/2014 1438   K 3.8 02/25/2015 1004   K 4.1 05/29/2014 1438   CL 105 02/25/2015 1004   CO2 29 02/25/2015 1004   CO2 25 05/29/2014 1438   BUN 12 02/25/2015 1004   BUN 11.0 05/29/2014 1438   CREATININE 0.85 02/25/2015 1004   CREATININE 1.1 05/29/2014 1438      Component Value Date/Time   CALCIUM 9.5 02/25/2015 1004   CALCIUM 9.3 05/29/2014 1438   ALKPHOS 39 02/25/2015 1004   ALKPHOS 53 05/29/2014 1438   AST 18 02/25/2015 1004   AST 34 05/29/2014 1438   ALT 15 02/25/2015 1004   ALT 24 05/29/2014 1438   BILITOT 0.4 02/25/2015 1004   BILITOT 0.28 05/29/2014 1438       No results found for: LABCA2  No components found for: LABCA125  No results for input(s): INR in the last 168 hours.  Urinalysis No results found for: COLORURINE  STUDIES: CLINICAL DATA: Postmenopausal. History of a total abdominal hysterectomy and bilateral oophorectomy. Patient with Fosamax for 4 years. Also takes calcium.  EXAM: DUAL X-RAY ABSORPTIOMETRY (DXA) FOR BONE MINERAL DENSITY  FINDINGS: AP LUMBAR SPINE L1 through L4  Bone Mineral Density (BMD): 1.071 g/cm2  Young Adult T-Score: 0.2  Z-Score: 1.7  LEFT FEMUR NECK  Bone  Mineral Density (BMD): 0.698 g/cm2  Young Adult T-Score: -1.4  Z-Score: -0.3  ASSESSMENT: Patient's diagnostic category is LOW BONE MASS by WHO Criteria.  FRAX: Not assessed, patient being treated for osteoporosis.  COMPARISON: 04/08/2010. Since prior study, there has been a 4.8% increase in bone density in the lumbar spine. There has been no statistically significant change in bone density for the left proximal femur.    ASSESSMENT: 75 y.o. [place] woman status post right breast biopsy 05/14/2013 for a clinical T2 N0, stage IIA invasive ductal carcinoma, grade 2 or 3, estrogen receptor 99% positive, progesterone receptor 50% positive, with an MIB-1 of 91%, and no HER-2 amplification  (1) 6 mm enhancing right sternal lesion noted by breast MRI 05/20/2013-- negative on PET 06/04/2013  (2) status post right lumpectomy and sentinel lymph node sampling to 07/12/2013 for a pT2 pN0, stage IIA invasive ductal carcinoma, mucinous, grade 2, with repeat HER-2 again negative.  (3) Oncotype score of 18 predicts a 10 year risk of outside the breast recurrence of 12% if the patient's only systemic treatment is tamoxifen for 5 years. It also predicts a minimal benefit from chemotherapy.  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 Hopefully that will help resolve the symptoms there.  She does have right upper extremity lymphedema. She has not been wearing her sleeve or doing the massage. We discussed this at length today. I think it would be useful if she had a "refresh her course" I am placing a referral to physical therapy with that in  mind.  She continues to have some reflux symptoms and a little bit of cough possibly worsened by her alendronate. We discussed the fact that we do have alternatives such as Denosumab shots even every 6 months. She will be considering this and we will discuss it further at the next visit.  We also reviewed her bone density. In addition to her calcium and vitamin D supplementation and we encouraged her to walk and currently she is doing a terrific job of that on her treadmill.  Otherwise she is going back on anastrozole and the plan will be to continue that for a total of 5 years.she'll see [place] again in August. She knows to call for any problems that may develop before that visit.    Chauncey Cruel, MD   05/21/2015 11:11 AM

## 2015-05-21 NOTE — Telephone Encounter (Signed)
Appointments made and avs printed for patient °

## 2015-05-26 ENCOUNTER — Ambulatory Visit: Payer: 59 | Attending: Oncology | Admitting: Physical Therapy

## 2015-05-26 DIAGNOSIS — M629 Disorder of muscle, unspecified: Secondary | ICD-10-CM | POA: Insufficient documentation

## 2015-05-26 DIAGNOSIS — M2420 Disorder of ligament, unspecified site: Secondary | ICD-10-CM

## 2015-05-26 DIAGNOSIS — I890 Lymphedema, not elsewhere classified: Secondary | ICD-10-CM | POA: Diagnosis not present

## 2015-05-26 DIAGNOSIS — M25611 Stiffness of right shoulder, not elsewhere classified: Secondary | ICD-10-CM | POA: Diagnosis present

## 2015-05-26 NOTE — Therapy (Signed)
[place], [place], [address] Phone: [phone]   Fax:  [phone]  Physical Therapy Evaluation  Patient Details  Name: Monica Diaz MRN: [id number] Date of Birth: April 16, 1941 Referring Provider: Dr. Jana Hakim   Encounter Date: 05/26/2015      PT End of Session - 05/26/15 1221    Visit Number 1   Number of Visits 10   Date for PT Re-Evaluation 06/15/15   PT Start Time 1100   PT Stop Time 1145   PT Time Calculation (min) 45 min   Activity Tolerance Patient tolerated treatment well   Behavior During Therapy [facility] for tasks assessed/performed      Past Medical History  Diagnosis Date  . Hyperlipidemia   . Osteoporosis   . Thyroid disease   . Wears glasses   . HOH (hard of hearing)   . Sleep apnea     uses a cpap  . Arthritis   . Cancer (HCC)     breast  . Seasonal allergies   . Hx of radiation therapy 07/18/13-08/14/13    right breast total dose 50 Gy    Past Surgical History  Procedure Laterality Date  . Abdominal hysterectomy      fibroids  . Left oophorectomy    . Colonoscopy    . Partial mastectomy with needle localization and axillary sentinel lymph node bx Right 05/28/2013    Procedure: PARTIAL MASTECTOMY WITH NEEDLE LOCALIZATION AND AXILLARY SENTINEL LYMPH NODE BIOPSY;  Surgeon: Adin Hector, MD;  Location: [facility];  Service: General;  Laterality: Right;    There were no vitals filed for this visit.  Visit Diagnosis:  Lymphedema - Plan: PT plan of care cert/re-cert  Disorder of muscle, ligament, and fascia - Plan: PT plan of care cert/re-cert  Stiffness of joint, shoulder region, right - Plan: PT plan of care cert/re-cert      Subjective Assessment - 05/26/15 1103    Subjective Pt states she says her right breast still looks 'angry"    Pertinent History Right breast lumpectomy 05/28/2013 with 3 nodes removed with radiation, but no chemotherapy.  She  has serious carpal tunnel surgery on both arms and will have surgery on 05/28/2015  Has a Juzo 20-30 compression sleeve    Patient Stated Goals to get a refresher for how to take care of her arm    Currently in Pain? No/denies  sometimes has buring in hands, fingers stiff and numb  pain with palaplattion at right lateral border of scapula            [facility] PT Assessment - 05/26/15 0001    Assessment   Medical Diagnosis breast cancer    Referring Provider Dr. Jana Hakim    Onset Date/Surgical Date 05/28/13   Hand Dominance Right   Precautions   Precautions Other (comment)   Restrictions   Weight Bearing Restrictions No   Balance Screen   Has the patient fallen in the past 6 months Yes  will not be addressed this visit    How many times? 1  fell out of bed while dreaming    Has the patient had a decrease in activity level because of a fear of falling?  No   Is the patient reluctant to leave their home because of a fear of falling?  No   Home Ecologist residence   Living Arrangements Alone   Prior Function   Level of Independence Independent  Vocation Retired   Leisure Goes to [employer] on treadmill.   Cognition   Overall Cognitive Status Within Functional Limits for tasks assessed   Observation/Other Assessments   Observations Pt with indentations of right breast from bra indicating lymphedema in right breast. It looks firm and fuller than left breast epecially at the top of breast.  Some fullness noted at right lateral trunk observed from behind    Skin Integrity Intact    Coordination   Gross Motor Movements are Fluid and Coordinated Yes   Posture/Postural Control   Posture/Postural Control No significant limitations   AROM   Right Shoulder Flexion 142 Degrees   Right Shoulder ABduction 144 Degrees   Right Shoulder External Rotation 75 Degrees   Left Shoulder Flexion 162 Degrees   Left Shoulder ABduction 164 Degrees   Left Shoulder  External Rotation 80 Degrees   Strength   Right Shoulder Flexion 4-/5   Right Shoulder ABduction 4-/5   Left Shoulder Flexion 4/5   Left Shoulder ABduction 4/5   Palpation   Palpation comment frimness with pain to fim pressure  at right lateral trunk just below axilla at area of radition fibrosis.  Alos firmness at anterior chest near incisions  mild firmness in axilla            LYMPHEDEMA/ONCOLOGY QUESTIONNAIRE - 05/26/15 1132    Right Upper Extremity Lymphedema   10 cm Proximal to Olecranon Process 29.8 cm   Olecranon Process 27.5 cm   15 cm Proximal to Ulnar Styloid Process 27.7 cm   10 cm Proximal to Ulnar Styloid Process 25 cm   Just Proximal to Ulnar Styloid Process 17.4 cm   Across Hand at [employer] 20.5 cm   At [place] of 2nd Digit 6.5 cm   Left Upper Extremity Lymphedema   10 cm Proximal to Olecranon Process 29.5 cm   Olecranon Process 27.5 cm   15 cm Proximal to Ulnar Styloid Process 26 cm   10 cm Proximal to Ulnar Styloid Process 23.9 cm   Just Proximal to Ulnar Styloid Process 17.1 cm   Across Hand at [employer] 20 cm   At [place] of 2nd Digit 6.1 cm           Quick Dash - 05/26/15 0001    Open a tight or new jar Unable   Do heavy household chores (wash walls, wash floors) Mild difficulty   Carry a shopping bag or briefcase No difficulty   Wash your back Mild difficulty   Use a knife to cut food Severe difficulty   Recreational activities in which you take some force or impact through your arm, shoulder, or hand (golf, hammering, tennis) Moderate difficulty   During the past week, to what extent has your arm, shoulder or hand problem interfered with your normal social activities with family, friends, neighbors, or groups? Not at all   During the past week, to what extent has your arm, shoulder or hand problem limited your work or other regular daily activities Not at all   Arm, shoulder, or hand pain. Severe   Tingling (pins and needles) in your arm,  shoulder, or hand Severe   Difficulty Sleeping Moderate difficulty   DASH Score 43.18 %             [facility] Adult PT Treatment/Exercise - 05/26/15 0001    Self-Care   Self-Care Other Self-Care Comments   Other Self-Care Comments  issued small dot peach foam in  thin stockinette to wear inside bra at lateral chest and breast  assisted with application of compression sleeve                         Long Term Clinic Goals - June 22, 2015 1252    CC Long Term Goal  #1   Title Patient with verbalize an understanding of lymphedema risk reduction precautions   Time 3   Period Weeks   Status New   CC Long Term Goal  #2   Title Patient will be independent in self manual lymph drainage so she can manage breast lymphedema at home    Time 3   Period Weeks   Status New   CC Long Term Goal  #3   Title Pt will state that she notices that the "puffines" is decreased by 50% to indicate a decrease in lymphedema    Time 3   Period Weeks   Status New   CC Long Term Goal  #4   Title Patient will be know how to obtain and use compression garments for maintenance phase of treatment   Time 3   Period Weeks   Status New   CC Long Term Goal  #5   Title Patient will decrease the DASH score to <  35  to demonstrate increased functional use of upper extremity            Plan - June 22, 2015 1222    Clinical Impression Statement Pt is just about 2 years post lumpectomy with radiation who is  preparing to have carpal tunnel surgery at the end of February.  She also has lymphedema in right breast , right anterior and lateral chest and axilla. She has decreased range of motion in flexion and abdcution on left shoulder, but says it doesn't bother her. She wants to increase the overall strength of her arms    Pt will benefit from skilled therapeutic intervention in order to improve on the following deficits Increased edema;Decreased strength;Decreased knowledge of use of DME;Decreased scar  mobility;Pain;Increased fascial restricitons;Decreased range of motion   Clinical Impairments Affecting Rehab Potential previous radiation to right upper chest, carpal tunnel in both hands with surgery scheuled    PT Frequency 3x / week   PT Duration 3 weeks   PT Treatment/Interventions ADLs/Self Care Home Management;Manual lymph drainage;Compression bandaging;Scar mobilization;Passive range of motion;Patient/family education;DME Instruction;Therapeutic activities;Taping;Manual techniques;Therapeutic exercise   PT Next Visit Plan Manual lymph drianage to right upper quadrant, especially breast. Evaluate effect of peach foam stretching and manual techniques to right lateral chest and axilla    Recommended Other Services Gave pt flyer for [employer] for after recovery from  carpal tunnel surgery    Consulted and Agree with Plan of Care Patient          G-Codes - 2015/06/22 1255    Functional Assessment Tool Used Quick DASH    Functional Limitation Carrying, moving and handling objects   Carrying, Moving and Handling Objects Current Status [id number]) At least 40 percent but less than 60 percent impaired, limited or restricted   Carrying, Moving and Handling Objects Goal Status [id number]) At least 20 percent but less than 40 percent impaired, limited or restricted       Problem List Patient Active Problem List   Diagnosis Date Noted  . Bilateral hand numbness 02/25/2015  . Osteopenia 12/08/2014  . Chronic hand pain 12/08/2014  . Chronic cough 12/08/2014  . Osteopenia determined by x-ray 06/05/2014  .  Chest pain 09/30/2013  . Breast cancer of upper-outer quadrant of right female breast ([place]) 05/16/2013  . OSA (obstructive sleep apnea) 05/15/2012  . 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 Traumatic ecchymosis of eye 03/20/2012  . Other neutropenia ([place]) 05/13/2011  . General medical examination 05/13/2011  . BUNION 03/25/2010  . Hypothyroidism 12/02/2009  . BENIGN NEOPLASM OF SKIN SITE  UNSPECIFIED 02/10/2009  . Vitamin D deficiency 02/10/2009  . Hyperlipidemia 12/22/2008  . SORE THROAT 12/22/2008  . RHINITIS 12/22/2008  . NECK PAIN 12/22/2008  . Osteoporosis 12/22/2008  . VERTIGO 12/22/2008   Donato Heinz. Owens Shark, PT  05/26/2015, 12:58 PM  [place], [place], [address] Phone: [phone]   Fax:  [phone]  Name: Monica Diaz MRN: [id number] Date of Birth: 1941/02/12

## 2015-05-27 ENCOUNTER — Ambulatory Visit: Payer: Medicare Other | Attending: Oncology | Admitting: Physical Therapy

## 2015-05-27 DIAGNOSIS — M629 Disorder of muscle, unspecified: Secondary | ICD-10-CM | POA: Diagnosis present

## 2015-05-27 DIAGNOSIS — I890 Lymphedema, not elsewhere classified: Secondary | ICD-10-CM | POA: Diagnosis not present

## 2015-05-27 DIAGNOSIS — M25611 Stiffness of right shoulder, not elsewhere classified: Secondary | ICD-10-CM | POA: Insufficient documentation

## 2015-05-27 NOTE — Therapy (Signed)
[place], [place], [address] Phone: [phone]   Fax:  [phone]  Physical Therapy Treatment  Patient Details  Name: Monica Diaz MRN: [id number] Date of Birth: 1940/08/13 Referring Provider: Dr. Jana Hakim   Encounter Date: 05/27/2015      PT End of Session - 05/26/15 1221    Visit Number 1   Number of Visits 10   Date for PT Re-Evaluation 06/15/15   PT Start Time 1100   PT Stop Time 1145   PT Time Calculation (min) 45 min   Activity Tolerance Patient tolerated treatment well   Behavior During Therapy [facility] for tasks assessed/performed      Past Medical History  Diagnosis Date  . Hyperlipidemia   . Osteoporosis   . Thyroid disease   . Wears glasses   . HOH (hard of hearing)   . Sleep apnea     uses a cpap  . Arthritis   . Cancer (HCC)     breast  . Seasonal allergies   . Hx of radiation therapy 07/18/13-08/14/13    right breast total dose 50 Gy    Past Surgical History  Procedure Laterality Date  . Abdominal hysterectomy      fibroids  . Left oophorectomy    . Colonoscopy    . Partial mastectomy with needle localization and axillary sentinel lymph node bx Right 05/28/2013    Procedure: PARTIAL MASTECTOMY WITH NEEDLE LOCALIZATION AND AXILLARY SENTINEL LYMPH NODE BIOPSY;  Surgeon: Adin Hector, MD;  Location: [place];  Service: General;  Laterality: Right;    There were no vitals filed for this visit.  Visit Diagnosis:  Lymphedema  Disorder of muscle, ligament, and fascia  Stiffness of joint, shoulder region, right      Subjective Assessment - 05/27/15 1106    Subjective  Had to take some ibuprofen to help with hands. the stiffness and numbness is still there  She says she does not not notice any effect of peach foam yet    Pertinent History Right breast lumpectomy 05/28/2013 with 3 nodes removed with radiation, but no chemotherapy.  She has serious  carpal tunnel surgery on both arms and will have surgery on 05/28/2015  Has a Juzo 20-30 compression sleeve    Patient Stated Goals to get a refresher for how to take care of her arm    Currently in Pain? No/denies               LYMPHEDEMA/ONCOLOGY QUESTIONNAIRE - 05/26/15 1132    Right Upper Extremity Lymphedema   10 cm Proximal to Olecranon Process 29.8 cm   Olecranon Process 27.5 cm   15 cm Proximal to Ulnar Styloid Process 27.7 cm   10 cm Proximal to Ulnar Styloid Process 25 cm   Just Proximal to Ulnar Styloid Process 17.4 cm   Across Hand at [employer] 20.5 cm   At [place] of 2nd Digit 6.5 cm   Left Upper Extremity Lymphedema   10 cm Proximal to Olecranon Process 29.5 cm   Olecranon Process 27.5 cm   15 cm Proximal to Ulnar Styloid Process 26 cm   10 cm Proximal to Ulnar Styloid Process 23.9 cm   Just Proximal to Ulnar Styloid Process 17.1 cm   Across Hand at [employer] 20 cm   At [place] of 2nd Digit 6.1 cm  [place] Adult PT Treatment/Exercise - 05/27/15 0001    Neck Exercises: Seated   Other Seated Exercise lymphatic flow yoga series with weblink provided    Manual Therapy   Manual therapy comments began instruction in self treatment and issuded [place] training DVD    Manual Lymphatic Drainage (MLD) insupine, short neck, superficial and deep abdominals, right inghinal nodes, right axillo -inguinal anastamosis, left axillary nodes, anterior intereaxillary anastamosis, right chest breast moving towards abdominals, then to sidelying for posterior interaxillary anastamosis and lateral trunk, extra time spent on scar at lateral chst    Kinesiotix   Edema skinkote, the fan shaped Ktape from right axillar scar, and under breast toward abdomen and inguinal nodes                 PT Education - 05/27/15 1158    Education provided Yes   Education Details lymphatic flow series neck range of motion and beginning manual lymph drainage     Person(s) Educated Patient   Methods Explanation;Demonstration  weblink and DVD    Comprehension Need further instruction                [facility] Goals - 2015-06-12 1252    CC Long Term Goal  #1   Title Patient with verbalize an understanding of lymphedema risk reduction precautions   Time 3   Period Weeks   Status New   CC Long Term Goal  #2   Title Patient will be independent in self manual lymph drainage so she can manage breast lymphedema at home    Time 3   Period Weeks   Status New   CC Long Term Goal  #3   Title Pt will state that she notices that the "puffines" is decreased by 50% to indicate a decrease in lymphedema    Time 3   Period Weeks   Status New   CC Long Term Goal  #4   Title Patient will be know how to obtain and use compression garments for maintenance phase of treatment   Time 3   Period Weeks   Status New   CC Long Term Goal  #5   Title Patient will decrease the DASH score to <  35  to demonstrate increased functional use of upper extremity            Plan - 05/27/15 1159    Clinical Impression Statement pt unable to say if peach foam is helping her and she reports some uncertaintly about the swelling she is having. She acknowledges her breast is firm and larger but always thought this was from 'scar tissue"  She is hopeful the treatment witll help her    Clinical Impairments Affecting Rehab Potential previous radiation to right upper chest, carpal tunnel in both hands with surgery scheuled    PT Next Visit Plan Manual lymph drianage to right upper quadrant, especially breast. Evaluate effect of peach foam  and Ktape stretching and manual techniques to right lateral chest and axilla    Consulted and Agree with Plan of Care Patient          G-Codes - 06-12-15 1255    Functional Assessment Tool Used Quick DASH    Functional Limitation Carrying, moving and handling objects   Carrying, Moving and Handling Objects Current Status [id number])  At least 40 percent but less than 60 percent impaired, limited or restricted   Carrying, Moving and Handling Objects Goal Status [id number]) At least 20 percent but less  than 40 percent impaired, limited or restricted      Problem List Patient Active Problem List   Diagnosis Date Noted  . Bilateral hand numbness 02/25/2015  . Osteopenia 12/08/2014  . Chronic hand pain 12/08/2014  . Chronic cough 12/08/2014  . Osteopenia determined by x-ray 06/05/2014  . Chest pain 09/30/2013  . Breast cancer of upper-outer quadrant of right female breast ([place]) 05/16/2013  . OSA (obstructive sleep apnea) 05/15/2012  . 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 Traumatic ecchymosis of eye 03/20/2012  . Other neutropenia ([place]) 05/13/2011  . General medical examination 05/13/2011  . BUNION 03/25/2010  . Hypothyroidism 12/02/2009  . BENIGN NEOPLASM OF SKIN SITE UNSPECIFIED 02/10/2009  . Vitamin D deficiency 02/10/2009  . Hyperlipidemia 12/22/2008  . SORE THROAT 12/22/2008  . RHINITIS 12/22/2008  . NECK PAIN 12/22/2008  . Osteoporosis 12/22/2008  . VERTIGO 12/22/2008   Donato Heinz. Owens Shark, PT    05/27/2015, 12:01 PM  [place], [place], [address] Phone: [phone]   Fax:  [phone]  Name: Monica Diaz MRN: [id number] Date of Birth: [birth year]

## 2015-05-27 NOTE — Patient Instructions (Signed)
[email or web address] Lymphatic flow series with Shoosh Lettick Crotzer 

## 2015-05-29 ENCOUNTER — Ambulatory Visit: Payer: Medicare Other | Admitting: Physical Therapy

## 2015-05-29 DIAGNOSIS — M629 Disorder of muscle, unspecified: Secondary | ICD-10-CM

## 2015-05-29 DIAGNOSIS — M25611 Stiffness of right shoulder, not elsewhere classified: Secondary | ICD-10-CM

## 2015-05-29 DIAGNOSIS — I890 Lymphedema, not elsewhere classified: Secondary | ICD-10-CM | POA: Diagnosis not present

## 2015-05-29 NOTE — Therapy (Signed)
[place], [place], [address] Phone: [phone]   Fax:  [phone]  Physical Therapy Treatment  Patient Details  Name: Monica Diaz MRN: [id number] Date of Birth: 05-Nov-1940 Referring Provider: Dr. Jana Hakim   Encounter Date: 05/29/2015      PT End of Session - 05/29/15 1244    Visit Number 3   Number of Visits 10   Date for PT Re-Evaluation 06/15/15   PT Start Time 0800   PT Stop Time 0845   PT Time Calculation (min) 45 min   Activity Tolerance Patient tolerated treatment well   Behavior During Therapy [facility] for tasks assessed/performed      Past Medical History  Diagnosis Date  . Hyperlipidemia   . Osteoporosis   . Thyroid disease   . Wears glasses   . HOH (hard of hearing)   . Sleep apnea     uses a cpap  . Arthritis   . Cancer (HCC)     breast  . Seasonal allergies   . Hx of radiation therapy 07/18/13-08/14/13    right breast total dose 50 Gy    Past Surgical History  Procedure Laterality Date  . Abdominal hysterectomy      fibroids  . Left oophorectomy    . Colonoscopy    . Partial mastectomy with needle localization and axillary sentinel lymph node bx Right 05/28/2013    Procedure: PARTIAL MASTECTOMY WITH NEEDLE LOCALIZATION AND AXILLARY SENTINEL LYMPH NODE BIOPSY;  Surgeon: Adin Hector, MD;  Location: [place];  Service: General;  Laterality: Right;    There were no vitals filed for this visit.  Visit Diagnosis:  Lymphedema  Disorder of muscle, ligament, and fascia  Stiffness of joint, shoulder region, right      Subjective Assessment - 05/29/15 0804    Subjective pt states she can't really tell if she is having improvement in her symptoms because she doesn't think she really has any problems.  she has has the fullness and firmness in her breast for so long and it hasn't really changeed   Pertinent History Right breast lumpectomy 05/28/2013 with 3  nodes removed with radiation, but no chemotherapy.  She has serious carpal tunnel surgery on both arms and will have surgery on 05/28/2015  Has a Juzo 20-30 compression sleeve    Patient Stated Goals to get a refresher for how to take care of her arm    Currently in Pain? Yes  she feels that her hands are swollen and have numbness    Pain Score 6    Pain Location Hand   Pain Orientation Left                         [facility] Adult PT Treatment/Exercise - 05/29/15 0001    Shoulder Exercises: Sidelying   ABduction AROM;5 reps   Other Sidelying Exercises small circles in each direction   Manual Therapy   Manual Lymphatic Drainage (MLD) insupine, short neck, superficial and deep abdominals, right inghinal nodes, right axillo -inguinal anastamosis, left axillary nodes, anterior intereaxillary anastamosis, right chest breast moving towards abdominals, then to sidelying for posterior interaxillary anastamosis and lateral trunk, extra time spent on scar at lateral chst    Kinesiotix   Edema skinkote, the fan shaped Ktape from right axillar scar, and under breast toward abdomen and inguinal nodes  [facility] Goals - 05/26/15 1252    CC Long Term Goal  #1   Title Patient with verbalize an understanding of lymphedema risk reduction precautions   Time 3   Period Weeks   Status New   CC Long Term Goal  #2   Title Patient will be independent in self manual lymph drainage so she can manage breast lymphedema at home    Time 3   Period Weeks   Status New   CC Long Term Goal  #3   Title Pt will state that she notices that the "puffines" is decreased by 50% to indicate a decrease in lymphedema    Time 3   Period Weeks   Status New   CC Long Term Goal  #4   Title Patient will be know how to obtain and use compression garments for maintenance phase of treatment   Time 3   Period Weeks   Status New   CC Long Term Goal  #5   Title Patient  will decrease the DASH score to <  35  to demonstrate increased functional use of upper extremity            Plan - 05/29/15 1245    Clinical Impression Statement Right breast seems to be softer to palpation and pt is able to recognize small improvement.  Pt was able to tolerate kinesiotape well    Clinical Impairments Affecting Rehab Potential previous radiation to right upper chest, carpal tunnel in both hands with surgery scheuled    PT Next Visit Plan Manual lymph drianage to right upper quadrant, especially breast. Continue with  peach foam  and Ktape and and manual techniques to right lateral chest and axilla         Problem List Patient Active Problem List   Diagnosis Date Noted  . Bilateral hand numbness 02/25/2015  . Osteopenia 12/08/2014  . Chronic hand pain 12/08/2014  . Chronic cough 12/08/2014  . Osteopenia determined by x-ray 06/05/2014  . Chest pain 09/30/2013  . Breast cancer of upper-outer quadrant of right female breast ([place]) 05/16/2013  . OSA (obstructive sleep apnea) 05/15/2012  . 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 Traumatic ecchymosis of eye 03/20/2012  . Other neutropenia ([place]) 05/13/2011  . General medical examination 05/13/2011  . BUNION 03/25/2010  . Hypothyroidism 12/02/2009  . BENIGN NEOPLASM OF SKIN SITE UNSPECIFIED 02/10/2009  . Vitamin D deficiency 02/10/2009  . Hyperlipidemia 12/22/2008  . SORE THROAT 12/22/2008  . RHINITIS 12/22/2008  . NECK PAIN 12/22/2008  . Osteoporosis 12/22/2008  . VERTIGO 12/22/2008   Donato Heinz. Owens Shark, PT  05/29/2015, 12:49 PM  [place], [place], [address] Phone: [phone]   Fax:  [phone]  Name: Monica Diaz MRN: [id number] Date of Birth: [birth year]

## 2015-06-01 ENCOUNTER — Ambulatory Visit
Admission: RE | Admit: 2015-06-01 | Discharge: 2015-06-01 | Disposition: A | Discharge status: Home / Self Care | Payer: Medicare Other | Source: Ambulatory Visit | Attending: Oncology | Admitting: Oncology

## 2015-06-01 DIAGNOSIS — Z9889 Other specified postprocedural states: Secondary | ICD-10-CM

## 2015-06-01 DIAGNOSIS — Z853 Personal history of malignant neoplasm of breast: Secondary | ICD-10-CM

## 2015-06-02 ENCOUNTER — Ambulatory Visit: Payer: Medicare Other | Admitting: Physical Therapy

## 2015-06-02 DIAGNOSIS — I890 Lymphedema, not elsewhere classified: Secondary | ICD-10-CM

## 2015-06-02 DIAGNOSIS — M25611 Stiffness of right shoulder, not elsewhere classified: Secondary | ICD-10-CM

## 2015-06-02 DIAGNOSIS — M2420 Disorder of ligament, unspecified site: Secondary | ICD-10-CM

## 2015-06-02 DIAGNOSIS — M629 Disorder of muscle, unspecified: Secondary | ICD-10-CM

## 2015-06-02 NOTE — Therapy (Signed)
[place], [place], [address] Phone: [phone]   Fax:  [phone]  Physical Therapy Treatment  Patient Details  Name: Monica Diaz MRN: [id number] Date of Birth: 16-Aug-1940 Referring Provider: Dr. Jana Hakim   Encounter Date: 06/02/2015      PT End of Session - 06/02/15 1649    Visit Number 4   Number of Visits 10   Date for PT Re-Evaluation 06/15/15   PT Start Time [id number]   PT Stop Time 1649   PT Time Calculation (min) 44 min      Past Medical History  Diagnosis Date  . Hyperlipidemia   . Osteoporosis   . Thyroid disease   . Wears glasses   . HOH (hard of hearing)   . Sleep apnea     uses a cpap  . Arthritis   . Cancer (HCC)     breast  . Seasonal allergies   . Hx of radiation therapy 07/18/13-08/14/13    right breast total dose 50 Gy    Past Surgical History  Procedure Laterality Date  . Abdominal hysterectomy      fibroids  . Left oophorectomy    . Colonoscopy    . Partial mastectomy with needle localization and axillary sentinel lymph node bx Right 05/28/2013    Procedure: PARTIAL MASTECTOMY WITH NEEDLE LOCALIZATION AND AXILLARY SENTINEL LYMPH NODE BIOPSY;  Surgeon: Adin Hector, MD;  Location: [facility];  Service: General;  Laterality: Right;    There were no vitals filed for this visit.  Visit Diagnosis:  Lymphedema  Stiffness of joint, shoulder region, right  Disorder of muscle, ligament, and fascia      Subjective Assessment - 06/02/15 1609    Subjective Pt is pleased with the outcome of her mammogram yesterday .  She reports the doctor did not see any evidence of fluid build up    Currently in Pain? No/denies                         [facility] Adult PT Treatment/Exercise - 06/02/15 0001    Manual Therapy   Manual Lymphatic Drainage (MLD) insupine, short neck, superficial and deep abdominals, right inghinal nodes, right axillo  -inguinal anastamosis, left axillary nodes, anterior intereaxillary anastamosis, right chest breast moving towards abdominals, then to sidelying for posterior interaxillary anastamosis and lateral trunk, extra time spent on scar at lateral chst    Kinesiotix   Edema skinkote, the fan shaped Ktape from right axillar scar, and under breast toward abdomen and inguinal nodes                 PT Education - 06/02/15 1649    Education Details continuing with self manual lymph draiange with hand over hand technique   Person(s) Educated Patient   Methods Explanation   Comprehension Verbalized understanding                [facility] Goals - 06/02/15 1702    CC Long Term Goal  #1   Title Patient with verbalize an understanding of lymphedema risk reduction precautions   Status Achieved   CC Long Term Goal  #2   Title Patient will be independent in self manual lymph drainage so she can manage breast lymphedema at home    Status On-going   CC Long Term Goal  #3   Title Pt will state that she notices that the "puffines" is decreased by  50% to indicate a decrease in lymphedema    Status On-going   CC Long Term Goal  #4   Title Patient will be know how to obtain and use compression garments for maintenance phase of treatment   Status On-going   CC Long Term Goal  #5   Title Patient will decrease the DASH score to <  35  to demonstrate increased functional use of upper extremity   Status On-going            Plan - 06/02/15 1650    Clinical Impression Statement Pt continues to have softening in breast but fullness persists between the scars at her right breast and axilla  she is becoming more involved with self manual lymph drainage and wants to use the kinesiotape for her plane trip to [place] this week.    Clinical Impairments Affecting Rehab Potential previous radiation to right upper chest, carpal tunnel in both hands with surgery scheuled    PT Next Visit Plan Manual  lymph drianage to right upper quadrant, especially breast. Continue with  peach foam  and Ktape and and manual techniques to right lateral chest and axilla Progress to exercise program next week with anticipation of discharge  prior to suregery on Feb 21   Consulted and Agree with Plan of Care Patient        Problem List Patient Active Problem List   Diagnosis Date Noted  . Bilateral hand numbness 02/25/2015  . Osteopenia 12/08/2014  . Chronic hand pain 12/08/2014  . Chronic cough 12/08/2014  . Osteopenia determined by x-ray 06/05/2014  . Chest pain 09/30/2013  . Breast cancer of upper-outer quadrant of right female breast ([place]) 05/16/2013  . OSA (obstructive sleep apnea) 05/15/2012  . 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 Traumatic ecchymosis of eye 03/20/2012  . Other neutropenia ([place]) 05/13/2011  . General medical examination 05/13/2011  . BUNION 03/25/2010  . Hypothyroidism 12/02/2009  . BENIGN NEOPLASM OF SKIN SITE UNSPECIFIED 02/10/2009  . Vitamin D deficiency 02/10/2009  . Hyperlipidemia 12/22/2008  . SORE THROAT 12/22/2008  . RHINITIS 12/22/2008  . NECK PAIN 12/22/2008  . Osteoporosis 12/22/2008  . VERTIGO 12/22/2008   Donato Heinz. Owens Shark, PT   06/02/2015, [place], [place], [address] Phone: [phone]   Fax:  [phone]  Name: Monica Diaz MRN: [id number] Date of Birth: 1941/04/11

## 2015-06-10 ENCOUNTER — Ambulatory Visit: Payer: Medicare Other | Admitting: Physical Therapy

## 2015-06-10 DIAGNOSIS — I890 Lymphedema, not elsewhere classified: Secondary | ICD-10-CM | POA: Diagnosis not present

## 2015-06-10 NOTE — Patient Instructions (Signed)
[email or web address] Courses Online Strength After Breast Cancer Look at the right of the page for Lymphedema Education Session  

## 2015-06-10 NOTE — Therapy (Signed)
[place], [place], [address] Phone: [phone]   Fax:  [phone]  Physical Therapy Treatment  Patient Details  Name: Monica Diaz MRN: [id number] Date of Birth: [birth year] Referring Provider: Dr. Jana Hakim   Encounter Date: 06/10/2015      PT End of Session - 06/10/15 1029    Visit Number 5   Number of Visits 10   Date for PT Re-Evaluation 06/15/15   PT Start Time 0931   PT Stop Time 1015   PT Time Calculation (min) 44 min   Activity Tolerance Patient tolerated treatment well   Behavior During Therapy [facility] for tasks assessed/performed      Past Medical History  Diagnosis Date  . Hyperlipidemia   . Osteoporosis   . Thyroid disease   . Wears glasses   . HOH (hard of hearing)   . Sleep apnea     uses a cpap  . Arthritis   . Cancer (HCC)     breast  . Seasonal allergies   . Hx of radiation therapy 07/18/13-08/14/13    right breast total dose 50 Gy    Past Surgical History  Procedure Laterality Date  . Abdominal hysterectomy      fibroids  . Left oophorectomy    . Colonoscopy    . Partial mastectomy with needle localization and axillary sentinel lymph node bx Right 05/28/2013    Procedure: PARTIAL MASTECTOMY WITH NEEDLE LOCALIZATION AND AXILLARY SENTINEL LYMPH NODE BIOPSY;  Surgeon: Adin Hector, MD;  Location: [place];  Service: General;  Laterality: Right;    There were no vitals filed for this visit.  Visit Diagnosis:  Lymphedema      Subjective Assessment - 06/10/15 1021    Subjective Pt had a wonderful time on her trip to [place] and did not have any increase in swelling.Marland Kitchen  She was able to keep the kinesiontape on the whole time, but forgot to wear her sleeve on the way home. She is preparing for her surgery next week and thinks tomorrow will be her last day of  PT   Pertinent History Right breast lumpectomy 05/28/2013 with 3 nodes removed with  radiation, but no chemotherapy.  She has serious carpal tunnel surgery on both arms and will have surgery on 05/28/2015  Has a Juzo 20-30 compression sleeve    Patient Stated Goals to get a refresher for how to take care of her arm    Currently in Pain? No/denies                         [facility] Adult PT Treatment/Exercise - 06/10/15 0001    Self-Care   Self-Care Other Self-Care Comments   Other Self-Care Comments  began instruction in Strength ABC program,  Issued handout and website for lymphedema education session and explained exercise progression and pt acknowlecged understanding    Manual Therapy   Manual Lymphatic Drainage (MLD) insupine, short neck, superficial and deep abdominals, right inghinal nodes, right axillo -inguinal anastamosis, left axillary nodes, anterior intereaxillary anastamosis, right chest breast moving towards abdominals, then to sidelying for posterior interaxillary anastamosis and lateral trunk, extra time spent on scar at lateral chst  Reinforced self techniques with use of mirror for visual cues especailly around scars in axilla and at breast. Pt acknwledged understanding and demonstrated ability to do technique                PT  Education - 06/10/15 1028    Education provided Yes   Education Details strength ABC program with lymphedema educaton session,    Person(s) Educated Patient   Methods Explanation;Handout   Comprehension Need further instruction                [facility] Goals - 06/10/15 1032    CC Long Term Goal  #1   Title Patient with verbalize an understanding of lymphedema risk reduction precautions   Status Achieved   CC Long Term Goal  #2   Title Patient will be independent in self manual lymph drainage so she can manage breast lymphedema at home    Status Achieved   CC Long Term Goal  #3   Title Pt will state that she notices that the "puffines" is decreased by 50% to indicate a decrease in lymphedema     Status On-going   CC Long Term Goal  #4   Title Patient will be know how to obtain and use compression garments for maintenance phase of treatment   Status Achieved   CC Long Term Goal  #5   Title Patient will decrease the DASH score to <  35  to demonstrate increased functional use of upper extremity   Status On-going            Plan - 06/10/15 1029    Clinical Impression Statement Pt continues to have improvement in softening of breast and skin color appears to be evening out. She is able to perform self manual lymph draiange and is using compression to assist with symptom control.  she is considering a compression bra. She has understanding of exercise progression but needs one more session to  learn the techniuqes of exercises that she will progress to after her hand surgery.  Several goals met    Rehab Potential Excellent   Clinical Impairments Affecting Rehab Potential previous radiation to right upper chest, carpal tunnel in both hands with surgery scheuled    PT Next Visit Plan Review strength ABC exercise. Discharge. do quick DASH    Consulted and Agree with Plan of Care --        Problem List Patient Active Problem List   Diagnosis Date Noted  . Bilateral hand numbness 02/25/2015  . Osteopenia 12/08/2014  . Chronic hand pain 12/08/2014  . Chronic cough 12/08/2014  . Osteopenia determined by x-ray 06/05/2014  . Chest pain 09/30/2013  . Breast cancer of upper-outer quadrant of right female breast ([place]) 05/16/2013  . OSA (obstructive sleep apnea) 05/15/2012  . 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 Traumatic ecchymosis of eye 03/20/2012  . Other neutropenia ([place]) 05/13/2011  . General medical examination 05/13/2011  . BUNION 03/25/2010  . Hypothyroidism 12/02/2009  . BENIGN NEOPLASM OF SKIN SITE UNSPECIFIED 02/10/2009  . Vitamin D deficiency 02/10/2009  . Hyperlipidemia 12/22/2008  . SORE THROAT 12/22/2008  . RHINITIS 12/22/2008  . NECK PAIN 12/22/2008  .  Osteoporosis 12/22/2008  . VERTIGO 12/22/2008   Donato Heinz. Owens Shark, PT  06/10/2015, 10:34 AM  [place], [place], [address] Phone: [phone]   Fax:  [phone]  Name: Monica Diaz MRN: [id number] Date of Birth: October 05, 1940

## 2015-06-11 ENCOUNTER — Ambulatory Visit: Payer: Medicare Other | Admitting: Physical Therapy

## 2015-06-11 DIAGNOSIS — M629 Disorder of muscle, unspecified: Secondary | ICD-10-CM

## 2015-06-11 DIAGNOSIS — I890 Lymphedema, not elsewhere classified: Secondary | ICD-10-CM | POA: Diagnosis not present

## 2015-06-11 DIAGNOSIS — M25611 Stiffness of right shoulder, not elsewhere classified: Secondary | ICD-10-CM

## 2015-06-11 NOTE — Therapy (Signed)
[place], [place], [address] Phone: [phone]   Fax:  [phone]  Physical Therapy Treatment  Patient Details  Name: Monica Diaz MRN: [id number] Date of Birth: 1940/10/05 Referring Provider: Dr. Jana Hakim   Encounter Date: 06/11/2015      PT End of Session - 06/11/15 1708    Visit Number 6   Number of Visits 10   Date for PT Re-Evaluation 06/15/15   PT Start Time 1430   PT Stop Time 1515   PT Time Calculation (min) 45 min   Activity Tolerance Patient tolerated treatment well   Behavior During Therapy [facility] for tasks assessed/performed      Past Medical History  Diagnosis Date  . Hyperlipidemia   . Osteoporosis   . Thyroid disease   . Wears glasses   . HOH (hard of hearing)   . Sleep apnea     uses a cpap  . Arthritis   . Cancer (HCC)     breast  . Seasonal allergies   . Hx of radiation therapy 07/18/13-08/14/13    right breast total dose 50 Gy    Past Surgical History  Procedure Laterality Date  . Abdominal hysterectomy      fibroids  . Left oophorectomy    . Colonoscopy    . Partial mastectomy with needle localization and axillary sentinel lymph node bx Right 05/28/2013    Procedure: PARTIAL MASTECTOMY WITH NEEDLE LOCALIZATION AND AXILLARY SENTINEL LYMPH NODE BIOPSY;  Surgeon: Adin Hector, MD;  Location: [facility];  Service: General;  Laterality: Right;    There were no vitals filed for this visit.  Visit Diagnosis:  Lymphedema  Stiffness of joint, shoulder region, right  Disorder of muscle, ligament, and fascia      Subjective Assessment - 06/11/15 1436    Subjective Pt feels that she has met her goal for lymphedema management and just wants to go over the strength program that she will be doing after her wrist surgery    Pertinent History Right breast lumpectomy 05/28/2013 with 3 nodes removed with radiation, but no chemotherapy.  She has serious  carpal tunnel surgery on both arms and will have surgery on 05/28/2015  Has a Juzo 20-30 compression sleeve    Patient Stated Goals to get a refresher for how to take care of her arm    Currently in Pain? No/denies                  Katina Dung - 06/11/15 0001    Open a tight or new jar Unable   Do heavy household chores (wash walls, wash floors) Mild difficulty   Carry a shopping bag or briefcase No difficulty   Wash your back Mild difficulty   Use a knife to cut food Moderate difficulty   Recreational activities in which you take some force or impact through your arm, shoulder, or hand (golf, hammering, tennis) No difficulty   During the past week, to what extent has your arm, shoulder or hand problem interfered with your normal social activities with family, friends, neighbors, or groups? Not at all   During the past week, to what extent has your arm, shoulder or hand problem limited your work or other regular daily activities Not at all   Arm, shoulder, or hand pain. Severe   Tingling (pins and needles) in your arm, shoulder, or hand Severe   Difficulty Sleeping Mild difficulty   DASH Score 34.09 %  [facility] Adult PT Treatment/Exercise - 06-20-2015 0001    Exercises   Exercises Other Exercises   Other Exercises  instructed in all exercise in the Strength ABC program and pt able to perform at least one rep of each.with not weights due to hand pain                 PT Education - 2015-06-20 1707    Education provided Yes   Education Details strength ABC program    Person(s) Educated Patient   Methods Explanation;Handout;Demonstration   Comprehension Returned demonstration;Verbalized understanding                [facility] Goals - 2015/06/20 1704    CC Long Term Goal  #1   Title Patient with verbalize an understanding of lymphedema risk reduction precautions   Status Achieved   CC Long Term Goal  #2   Title Patient will be independent  in self manual lymph drainage so she can manage breast lymphedema at home    Status Achieved   CC Long Term Goal  #3   Title Pt will state that she notices that the "puffines" is decreased by 50% to indicate a decrease in lymphedema    Status Achieved   CC Long Term Goal  #4   Title Patient will be know how to obtain and use compression garments for maintenance phase of treatment   Status Achieved   CC Long Term Goal  #5   Title Patient will decrease the DASH score to <  35  to demonstrate increased functional use of upper extremity   Baseline 34.09 with main difficulty coming from hand pain from carpal tunnel syndrome    Status Achieved            Plan - 2015-06-20 1708    Clinical Impression Statement Pt acknowledged understanding of Strength ABC program and how to progress exercise once she is recovered from hand surgery. She accepted another flyer about the [employer] program and may start our there. All goals are complete and she is ready to discharge    Clinical Impairments Affecting Rehab Potential previous radiation to right upper chest, carpal tunnel in both hands with surgery scheuled    PT Next Visit Plan Discharge           G-Codes - June 20, 2015 1710    Functional Assessment Tool Used Quick DASH    Functional Limitation Carrying, moving and handling objects   Carrying, Moving and Handling Objects Goal Status (O5366) At least 20 percent but less than 40 percent impaired, limited or restricted   Carrying, Moving and Handling Objects Discharge Status [phone]) At least 20 percent but less than 40 percent impaired, limited or restricted      Problem List Patient Active Problem List   Diagnosis Date Noted  . Bilateral hand numbness 02/25/2015  . Osteopenia 12/08/2014  . Chronic hand pain 12/08/2014  . Chronic cough 12/08/2014  . Osteopenia determined by x-ray 06/05/2014  . Chest pain 09/30/2013  . Breast cancer of upper-outer quadrant of right female breast ([place])  05/16/2013  . OSA (obstructive sleep apnea) 05/15/2012  . 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 Traumatic ecchymosis of eye 03/20/2012  . Other neutropenia ([place]) 05/13/2011  . General medical examination 05/13/2011  . BUNION 03/25/2010  . Hypothyroidism 12/02/2009  . BENIGN NEOPLASM OF SKIN SITE UNSPECIFIED 02/10/2009  . Vitamin D deficiency 02/10/2009  . Hyperlipidemia 12/22/2008  . SORE THROAT 12/22/2008  . RHINITIS 12/22/2008  .  NECK PAIN 12/22/2008  . Osteoporosis 12/22/2008  . VERTIGO 12/22/2008     PHYSICAL THERAPY DISCHARGE SUMMARY  Visits from Start of Care: 6 Current functional level related to goals / functional outcomes: Pt feels like she knows how to take care of her arm    Remaining deficits: Right breast lymphedema    Education / Equipment: Self manual lymph drainage, use of compression, strenthening program Plan: Patient agrees to discharge.  Patient goals were partially met. Patient is being discharged due to meeting the stated rehab goals.  ?????         Donato Heinz. Owens Shark, PT  06/11/2015, 5:11 PM  [place], [place], [address] Phone: [phone]   Fax:  [phone]  Name: Monica Diaz MRN: [id number] Date of Birth: December 11, 1940

## 2015-06-12 ENCOUNTER — Ambulatory Visit: Payer: Medicare Other | Admitting: Physical Therapy

## 2015-06-15 ENCOUNTER — Ambulatory Visit: Payer: Medicare PPO | Admitting: Oncology

## 2015-06-15 ENCOUNTER — Other Ambulatory Visit: Payer: Medicare PPO

## 2015-07-22 ENCOUNTER — Telehealth: Payer: Self-pay | Admitting: *Deleted

## 2015-07-22 NOTE — Telephone Encounter (Signed)
  Oncology Nurse Navigator Documentation  Navigator Location: [facility] On[facility] (07/22/15 1300) Navigator Encounter Type: Telephone (07/22/15 1300)  I called patient to check in.  She reports that she is doing well.  She underwent carpal tunnel surgery in February and reports that it is healing well.  The trigger finger, burning and pain she was experiencing prior to surgery is resolved.  She denies any questions or concerns at this time.  I encouraged her to call me for any needs.           Treatment Phase: Follow-up (07/22/15 1300) Barriers/Navigation Needs: No barriers at this time (07/22/15 1300)   Interventions: None required (07/22/15 1300)            Acuity: Level 1 (07/22/15 1300) Acuity Level 1: Minimal follow up required (07/22/15 1300)       Time Spent with Patient: 15 (07/22/15 1300)

## 2015-08-27 ENCOUNTER — Ambulatory Visit (INDEPENDENT_AMBULATORY_CARE_PROVIDER_SITE_OTHER): Payer: Medicare Other | Admitting: Family Medicine

## 2015-08-27 ENCOUNTER — Encounter: Payer: Self-pay | Admitting: Family Medicine

## 2015-08-27 VITALS — BP 112/76 | HR 77 | Temp 98.1°F | Resp 16 | Ht 64.0 in | Wt 167.5 lb

## 2015-08-27 DIAGNOSIS — Z0000 Encounter for general adult medical examination without abnormal findings: Secondary | ICD-10-CM

## 2015-08-27 DIAGNOSIS — E785 Hyperlipidemia, unspecified: Secondary | ICD-10-CM | POA: Diagnosis not present

## 2015-08-27 DIAGNOSIS — M810 Age-related osteoporosis without current pathological fracture: Secondary | ICD-10-CM

## 2015-08-27 DIAGNOSIS — E039 Hypothyroidism, unspecified: Secondary | ICD-10-CM | POA: Diagnosis not present

## 2015-08-27 LAB — HEPATIC FUNCTION PANEL
ALBUMIN: 4.1 g/dL (ref 3.5–5.2)
ALK PHOS: 39 U/L (ref 39–117)
ALT: 16 U/L (ref 0–35)
AST: 23 U/L (ref 0–37)
BILIRUBIN DIRECT: 0.1 mg/dL (ref 0.0–0.3)
Total Bilirubin: 0.4 mg/dL (ref 0.2–1.2)
Total Protein: 7.4 g/dL (ref 6.0–8.3)

## 2015-08-27 LAB — BASIC METABOLIC PANEL
BUN: 12 mg/dL (ref 6–23)
CHLORIDE: 105 meq/L (ref 96–112)
CO2: 28 mEq/L (ref 19–32)
Calcium: 9.4 mg/dL (ref 8.4–10.5)
Creatinine, Ser: 0.91 mg/dL (ref 0.40–1.20)
GFR: 77.88 mL/min (ref >60.00)
Glucose, Bld: 98 mg/dL (ref 70–99)
POTASSIUM: 4 meq/L (ref 3.5–5.1)
SODIUM: 139 meq/L (ref 135–145)

## 2015-08-27 LAB — CBC WITH DIFFERENTIAL/PLATELET
BASOS PCT: 0.3 % (ref 0.0–3.0)
Basophils Absolute: 0 10*3/uL (ref 0.0–0.1)
EOS PCT: 2.1 % (ref 0.0–5.0)
Eosinophils Absolute: 0.1 10*3/uL (ref 0.0–0.7)
HCT: 36.4 % (ref 36.0–46.0)
Hemoglobin: 12.1 g/dL (ref 12.0–15.0)
LYMPHS ABS: 2 10*3/uL (ref 0.7–4.0)
Lymphocytes Relative: 37.1 % (ref 12.0–46.0)
MCHC: 33.2 g/dL (ref 30.0–36.0)
MCV: 90.7 fl (ref 78.0–100.0)
MONO ABS: 0.6 10*3/uL (ref 0.1–1.0)
MONOS PCT: 10.8 % (ref 3.0–12.0)
NEUTROS ABS: 2.6 10*3/uL (ref 1.4–7.7)
NEUTROS PCT: 49.7 % (ref 43.0–77.0)
PLATELETS: 261 10*3/uL (ref 150.0–400.0)
RBC: 4.01 Mil/uL (ref 3.87–5.11)
RDW: 15.5 % (ref 11.5–15.5)
WBC: 5.3 10*3/uL (ref 4.0–10.5)

## 2015-08-27 LAB — LIPID PANEL
CHOL/HDL RATIO: 2
CHOLESTEROL: 152 mg/dL (ref 0–200)
HDL: 60.8 mg/dL (ref >39.00)
LDL Cholesterol: 80 mg/dL (ref 0–99)
NonHDL: 90.88
TRIGLYCERIDES: 56 mg/dL (ref 0.0–149.0)
VLDL: 11.2 mg/dL (ref 0.0–40.0)

## 2015-08-27 LAB — VITAMIN D 25 HYDROXY (VIT D DEFICIENCY, FRACTURES): VITD: 41.95 ng/mL (ref 30.00–100.00)

## 2015-08-27 LAB — TSH: TSH: 1.39 u[IU]/mL (ref 0.35–4.50)

## 2015-08-27 NOTE — Patient Instructions (Signed)
Follow up in 6 months to recheck thyroid and cholesterol We'll notify you of your lab results and make any changes if needed Keep up the good work on healthy diet and regular exercise- you look great!! You are due for colonoscopy next year You are up to date on mammo and bone density until next year- yay! Call with any questions or concerns Thanks for sticking with [place]! Have a great trip to [place]!!!

## 2015-08-27 NOTE — Assessment & Plan Note (Signed)
Pt's PE WNL.  UTD on colonoscopy, mammo, DEXA, immunizations.  No need for paps.  Written screening schedule updated and given to pt.  Check labs.  Anticipatory guidance provided.

## 2015-08-27 NOTE — Assessment & Plan Note (Signed)
Chronic problem.  UTD on DEXA.  On Fosamax and daily Ca and Vit D.  Check Vit D level and replete prn.

## 2015-08-27 NOTE — Progress Notes (Signed)
Pre visit review using our clinic review tool, if applicable. No additional management support is needed unless otherwise documented below in the visit note. 

## 2015-08-27 NOTE — Progress Notes (Signed)
   Subjective:    Patient ID: Monica Diaz, female    DOB: 1941/03/14, 75 y.o.   MRN: [id number]  HPI Here today for CPE.  Risk Factors: Hyperlipidemia- chronic problem, on Crestor Hypothyroid- chronic problem, on [place] thyroid Osteoporosis- chronic problem, on Fosamax.  Due for repeat DEXA at the end of the year.   Physical Activity: very active, walking 3 miles at the gym Fall Risk: low risk Depression: denies  Hearing: normal to conversational tones and decreased to whispered voice ADL's: independent Cognitive: normal linear thought process, memory and attention intact Home Safety: safe at home Height, Weight, BMI, Visual Acuity: see vitals, vision corrected to 20/20 w/ glasses Counseling: due for colonoscopy next year (Dr Collene Mares), UTD on mammo, due for DEXA at the ned of the year.  UTD on immunization Care team reviewed and updated Labs Ordered: See A&P Care Plan: See A&P    Review of Systems Patient reports no vision/ hearing changes, adenopathy,fever, weight change,  persistant/recurrent hoarseness , swallowing issues, chest pain, palpitations, edema, persistant/recurrent cough, hemoptysis, dyspnea (rest/exertional/paroxysmal nocturnal), gastrointestinal bleeding (melena, rectal bleeding), significant heartburn, bowel changes, GU symptoms (dysuria, hematuria, incontinence), Gyn symptoms (abnormal  bleeding, pain),  syncope, focal weakness, memory loss, skin/hair/nail changes, abnormal bruising or bleeding, anxiety, or depression.   + abd pain in lower abd- occurs first thing in AM, doesn't occur daily.  sxs started a few months ago.  Improves after using the restroom but not associated w/ the need to use the bathroom.  sxs resolve spontaneously.  Pt admits to intermittent constipation.   + numbness of hands due to carpal tunnel    Objective:   Physical Exam General Appearance:    Alert, cooperative, no distress, appears stated age  Head:    Normocephalic, without obvious  abnormality, atraumatic  Eyes:    PERRL, conjunctiva/corneas clear, EOM's intact, fundi    benign, both eyes  Ears:    Normal TM's and external ear canals, both ears  Nose:   Nares normal, septum midline, mucosa normal, no drainage    or sinus tenderness  Throat:   Lips, mucosa, and tongue normal; teeth and gums normal  Neck:   Supple, symmetrical, trachea midline, no adenopathy;    Thyroid: no enlargement/tenderness/nodules  Back:     Symmetric, no curvature, ROM normal, no CVA tenderness  Lungs:     Clear to auscultation bilaterally, respirations unlabored  Chest Wall:    No tenderness or deformity   Heart:    Regular rate and rhythm, S1 and S2 normal, no murmur, rub   or gallop  Breast Exam:    Deferred to mammo  Abdomen:     Soft, non-tender, bowel sounds active all four quadrants,    no masses, no organomegaly  Genitalia:    Deferred  Rectal:    Extremities:   Extremities normal, atraumatic, no cyanosis or edema  Pulses:   2+ and symmetric all extremities  Skin:   Skin color, texture, turgor normal, no rashes or lesions  Lymph nodes:   Cervical, supraclavicular, and axillary nodes normal  Neurologic:   CNII-XII intact, normal strength, sensation and reflexes    throughout          Assessment & Plan:

## 2015-08-27 NOTE — Assessment & Plan Note (Signed)
Chronic problem.  Currently asymptomatic.  Check labs.  Adjust meds prn  

## 2015-08-27 NOTE — Assessment & Plan Note (Signed)
Chronic problem.  Tolerating statin w/o difficulty.  AM abdominal pain more consistent w/ constipation or functional pain- this was discussed w/ pt.  Pt is exercising regularly.  Encouraged healthy food choices.  Check labs.  Adjust meds prn

## 2015-09-01 ENCOUNTER — Other Ambulatory Visit: Payer: Self-pay | Admitting: Nurse Practitioner

## 2015-11-19 ENCOUNTER — Other Ambulatory Visit: Payer: Self-pay | Admitting: Family Medicine

## 2015-11-19 NOTE — Telephone Encounter (Signed)
Medication filled to pharmacy as requested.   

## 2015-12-07 IMAGING — MG MM DIGITAL SCREENING BILAT W/ CAD
8 series · 8 of 8 positions shown · non-contrast
Comparison: Previous exam(s)

CLINICAL DATA: Screening.

EXAM:
DIGITAL SCREENING BILATERAL MAMMOGRAM WITH CAD
DIGITAL BREAST TOMOSYNTHESIS
Digital breast tomosynthesis images are acquired in two projections.
These images are reviewed in combination with the digital mammogram,
confirming the findings below.

[L CC]
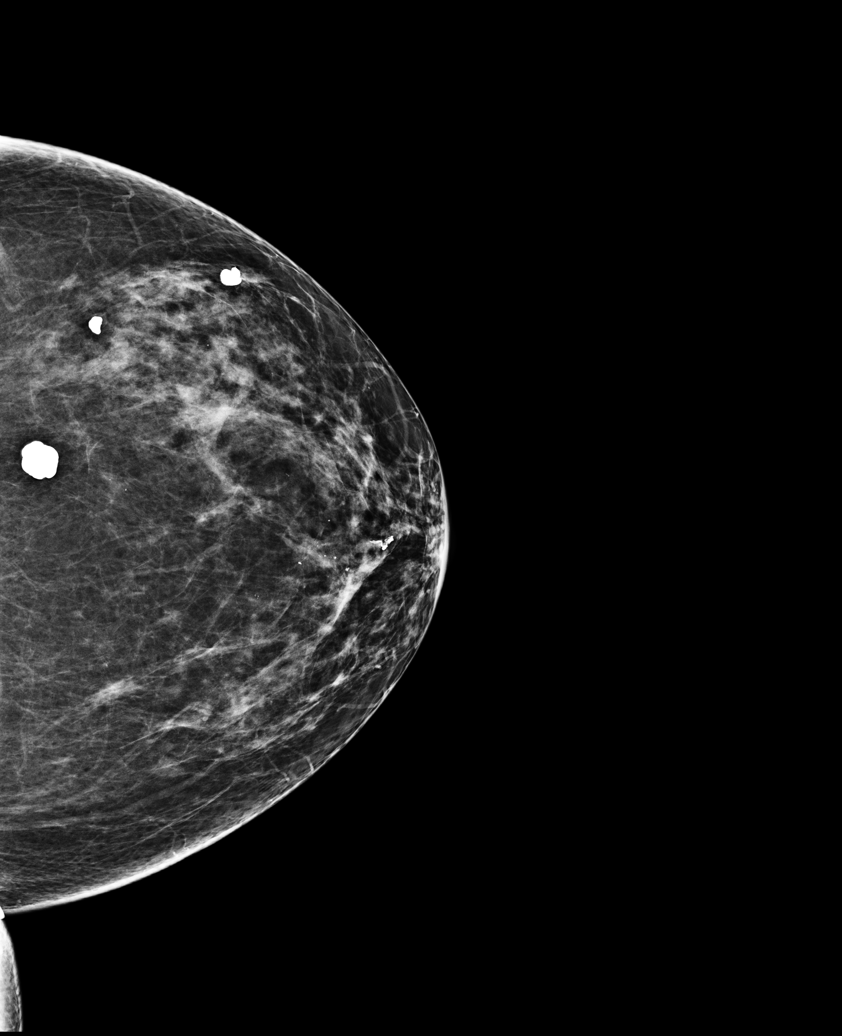

[R CC]
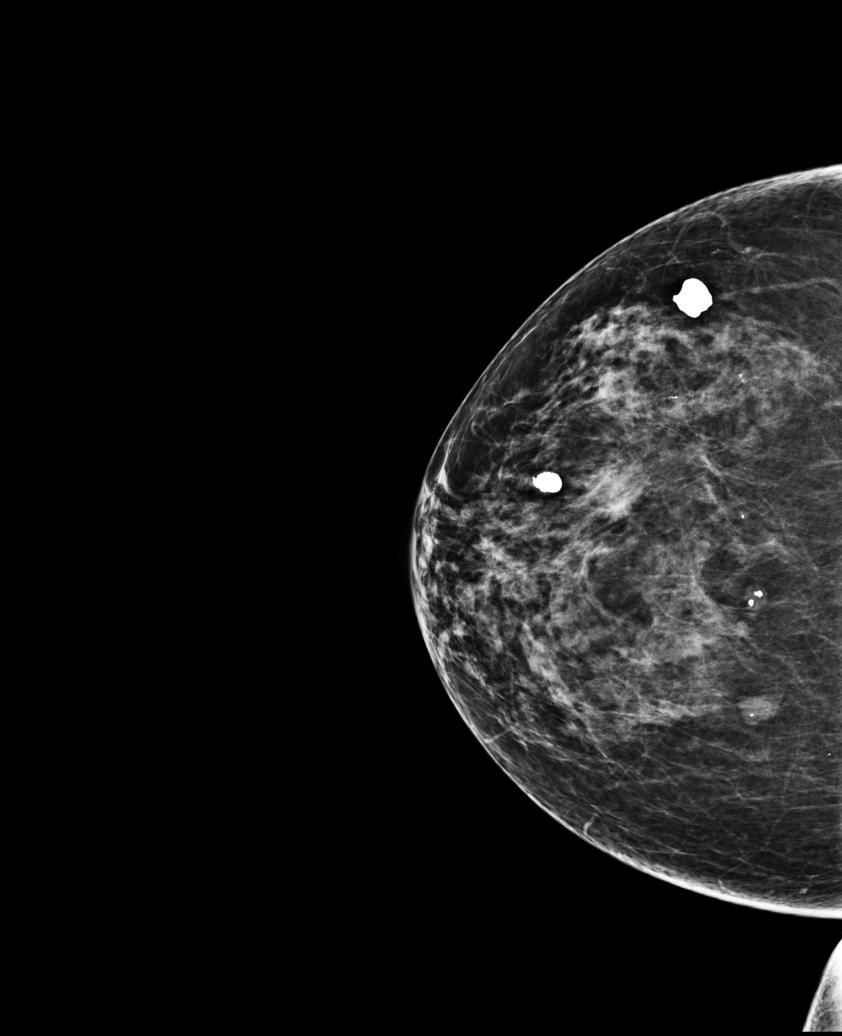

[R MLO]
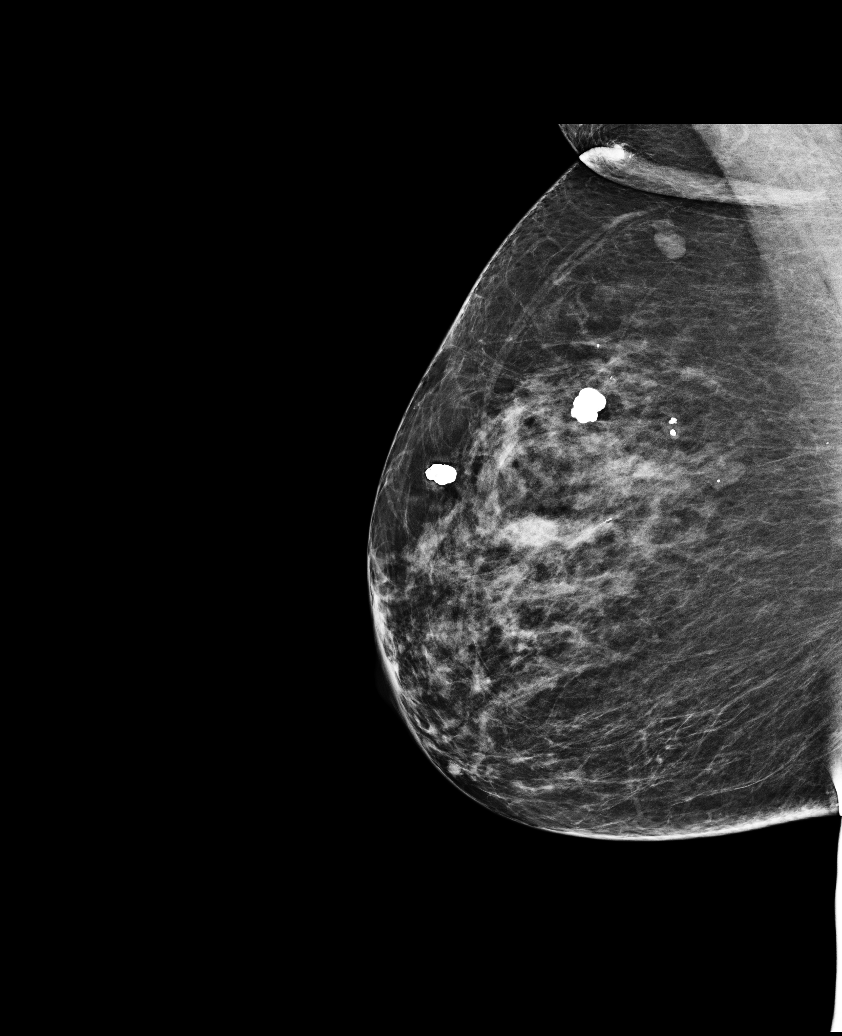

[L MLO]
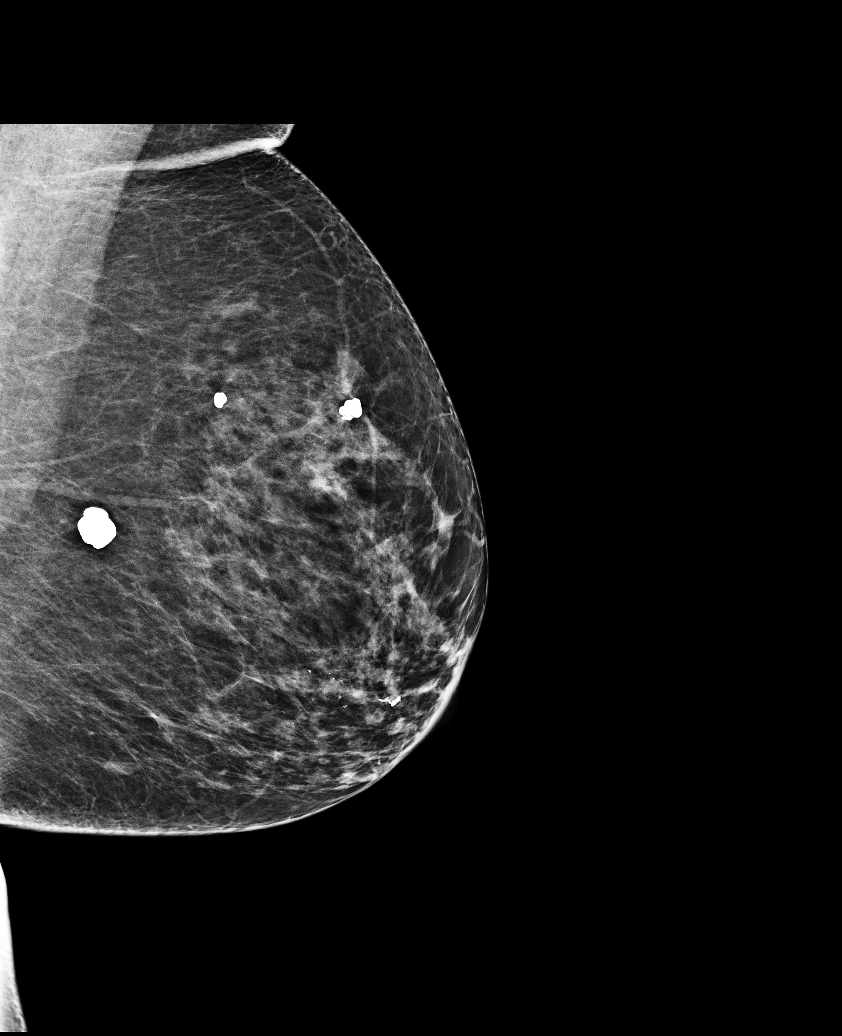

[R CC tomo]
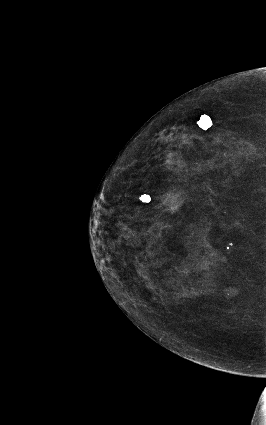

[L CC tomo]
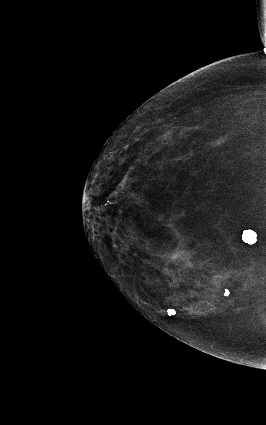

[R MLO tomo]
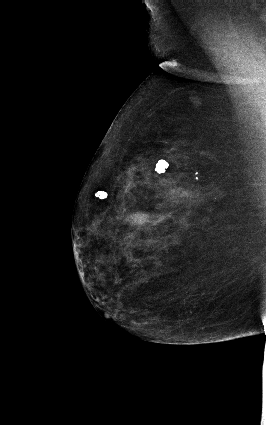

[L MLO tomo]
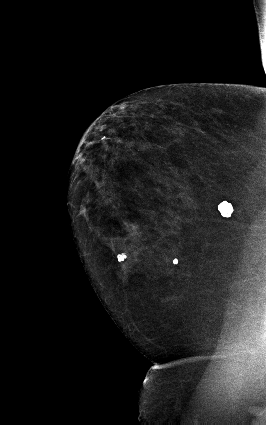

[8 of 8 positions shown; findings below may reference images not displayed]

ACR Breast Density Category c: The breasts are heterogeneously
dense, which may obscure small masses
FINDINGS: In the right breast, a possible mass warrants further evaluation
with spot compression views and possibly ultrasound. In the left
breast, no masses or malignant type calcifications are identified.
Images were processed with CAD.
IMPRESSION: Further evaluation is suggested for possible mass in the right
breast.

RECOMMENDATION:
Diagnostic mammogram and possibly ultrasound of the right breast.
(Code:[id number])

The patient will be contacted regarding the findings, and additional
imaging will be scheduled.

BI-RADS CATEGORY  0: Incomplete. Need additional imaging evaluation
and/or prior mammograms for comparison.

## 2015-12-16 ENCOUNTER — Other Ambulatory Visit: Payer: Self-pay | Admitting: Family Medicine

## 2015-12-16 ENCOUNTER — Other Ambulatory Visit: Payer: Self-pay | Admitting: Adult Health

## 2015-12-16 ENCOUNTER — Ambulatory Visit: Payer: 59 | Admitting: Nurse Practitioner

## 2015-12-16 ENCOUNTER — Other Ambulatory Visit: Payer: Medicare Other

## 2015-12-16 DIAGNOSIS — C50411 Malignant neoplasm of upper-outer quadrant of right female breast: Secondary | ICD-10-CM

## 2015-12-17 ENCOUNTER — Ambulatory Visit (HOSPITAL_BASED_OUTPATIENT_CLINIC_OR_DEPARTMENT_OTHER): Payer: Medicare Other | Admitting: Adult Health

## 2015-12-17 ENCOUNTER — Telehealth: Payer: Self-pay | Admitting: Adult Health

## 2015-12-17 ENCOUNTER — Other Ambulatory Visit (HOSPITAL_BASED_OUTPATIENT_CLINIC_OR_DEPARTMENT_OTHER): Payer: Medicare Other

## 2015-12-17 ENCOUNTER — Encounter: Payer: Self-pay | Admitting: *Deleted

## 2015-12-17 VITALS — BP 120/92 | HR 72 | Temp 98.1°F | Resp 18 | Ht 64.0 in | Wt 167.2 lb

## 2015-12-17 DIAGNOSIS — C50411 Malignant neoplasm of upper-outer quadrant of right female breast: Secondary | ICD-10-CM

## 2015-12-17 DIAGNOSIS — Z170 Estrogen receptor positive status [ER+]: Secondary | ICD-10-CM | POA: Diagnosis not present

## 2015-12-17 DIAGNOSIS — C50911 Malignant neoplasm of unspecified site of right female breast: Secondary | ICD-10-CM

## 2015-12-17 DIAGNOSIS — Z79811 Long term (current) use of aromatase inhibitors: Secondary | ICD-10-CM | POA: Diagnosis not present

## 2015-12-17 LAB — COMPREHENSIVE METABOLIC PANEL
ALBUMIN: 3.5 g/dL (ref 3.5–5.0)
ALK PHOS: 47 U/L (ref 40–150)
ALT: 15 U/L (ref 0–55)
AST: 22 U/L (ref 5–34)
Anion Gap: 6 mEq/L (ref 3–11)
BILIRUBIN TOTAL: 0.3 mg/dL (ref 0.20–1.20)
BUN: 13 mg/dL (ref 7.0–26.0)
CO2: 24 meq/L (ref 22–29)
CREATININE: 0.9 mg/dL (ref 0.6–1.1)
Calcium: 9.4 mg/dL (ref 8.4–10.4)
Chloride: 109 mEq/L (ref 98–109)
EGFR: 78 mL/min/{1.73_m2} — AB (ref >90)
GLUCOSE: 85 mg/dL (ref 70–140)
Potassium: 4 mEq/L (ref 3.5–5.1)
SODIUM: 140 meq/L (ref 136–145)
TOTAL PROTEIN: 7.4 g/dL (ref 6.4–8.3)

## 2015-12-17 LAB — CBC WITH DIFFERENTIAL/PLATELET
BASO%: 0.2 % (ref 0.0–2.0)
Basophils Absolute: 0 10*3/uL (ref 0.0–0.1)
EOS ABS: 0.1 10*3/uL (ref 0.0–0.5)
EOS%: 1.2 % (ref 0.0–7.0)
HCT: 34.3 % — ABNORMAL LOW (ref 34.8–46.6)
HEMOGLOBIN: 11.3 g/dL — AB (ref 11.6–15.9)
LYMPH%: 35.4 % (ref 14.0–49.7)
MCH: 29.8 pg (ref 25.1–34.0)
MCHC: 32.9 g/dL (ref 31.5–36.0)
MCV: 90.6 fL (ref 79.5–101.0)
MONO#: 0.7 10*3/uL (ref 0.1–0.9)
MONO%: 13 % (ref 0.0–14.0)
NEUT%: 50.2 % (ref 38.4–76.8)
NEUTROS ABS: 2.6 10*3/uL (ref 1.5–6.5)
Platelets: 252 10*3/uL (ref 145–400)
RBC: 3.79 10*6/uL (ref 3.70–5.45)
RDW: 14.6 % — AB (ref 11.2–14.5)
WBC: 5.3 10*3/uL (ref 3.9–10.3)
lymph#: 1.9 10*3/uL (ref 0.9–3.3)

## 2015-12-17 NOTE — Progress Notes (Signed)
  Oncology Nurse Navigator Documentation  Navigator Location: [facility] (12/17/15 1510) Navigator Encounter Type: Follow-up Appt (12/17/15 1510)  Patient at [facility] for routine follow up visit.  She reports that she is doing well.  Since she was here last, she has had carpal tunnel surgery on both hands with the left being most recent.  Both have healed.  She has completed PT and is doing home exercises.  She does report that her right arm often feels a little achy and she worries about the surgery having hurt her arm.  She does have a lymphedema sleeve which she is not currently wearing but reports does make her arm feel better.  I shared her concerns with NP prior to visit.  She denied any other questions or concerns.  I encouraged her to call me for any questions or needs.           Patient Visit Type: MedOnc (12/17/15 1510) Treatment Phase: Follow-up (12/17/15 1510) Barriers/Navigation Needs: No barriers at this time (12/17/15 1510)   Interventions: None required (12/17/15 1510)            Acuity: Level 1 (12/17/15 1510) Acuity Level 1: Initial guidance, education and coordination as needed;Minimal follow up required (12/17/15 1510)       Time Spent with Patient: 15 (12/17/15 1510)

## 2015-12-17 NOTE — Telephone Encounter (Signed)
appt made and avs printed °

## 2015-12-18 ENCOUNTER — Encounter: Payer: Self-pay | Admitting: Adult Health

## 2015-12-18 NOTE — Progress Notes (Signed)
CLINIC:  Survivorship   REASON FOR VISIT:  Routine follow-up for history of breast cancer.   BRIEF ONCOLOGIC HISTORY:  (from Dr. Virgie Dad last note on 05/21/15)   INTERVAL HISTORY:  Monica Diaz presents to the [facility] today for routine follow-up for her history of breast cancer.  Overall, she reports feeling quite well.  She has had bilateral carpal tunnel surgeries, which has drastically improved her hand/wrist pain and mobility. She still has some joint stiffness, but this improves with movement.  She remains on the anastrazole with good tolerance; she has some hot flashes, but they are not severe.  She denies any vaginal dryness.  She had some right lymphedema and went to PT for further evaluation and patient education.  She does not regularly wear a lymphedema sleeve.    At her last visit with Dr. Jana Diaz, she reported some reflux type symptoms. She tells me that she has an intermittent cough, that seems to be worse when she takes the Fosamax.  The cough has been "annoying" for her, so she went to see her doctor about it several months ago.  He recommended she stop the Fosamax for about 1 month and put her on Prilosec. Her cough resolved during this time.  She has since restarted the Fosamax and her cough has returned.  She is no longer taking the Prilosec.    She has no new breast concerns today.    REVIEW OF SYSTEMS:  Review of Systems  Constitutional: Negative.   HENT: Negative.   Eyes: Negative.   Respiratory: Positive for cough.   Cardiovascular:       Periodic feet swelling   Gastrointestinal:       ? reflux  Genitourinary: Negative.   Musculoskeletal: Positive for joint pain and myalgias.  Skin: Negative.   Neurological: Negative.   Endo/Heme/Allergies:       Occasional hot flashes, but not severe.   Psychiatric/Behavioral: Negative.   GU: Denies vaginal bleeding, discharge, or dryness.  Breast: Denies any new nodularity, masses, tenderness, nipple  changes, or nipple discharge.    A 14-point review of systems was completed and was negative, except as noted above.    PAST MEDICAL/SURGICAL HISTORY:  Past Medical History:  Diagnosis Date  . Arthritis   . Cancer ([place])    breast  . HOH (hard of hearing)   . Hx of radiation therapy 07/18/13-08/14/13   right breast total dose 50 Gy  . Hyperlipidemia   . Osteoporosis   . Seasonal allergies   . Sleep apnea    uses a cpap  . Thyroid disease   . Wears glasses    Past Surgical History:  Procedure Laterality Date  . ABDOMINAL HYSTERECTOMY     fibroids  . COLONOSCOPY    . LEFT OOPHORECTOMY    . PARTIAL MASTECTOMY WITH NEEDLE LOCALIZATION AND AXILLARY SENTINEL LYMPH NODE BX Right 05/28/2013   Procedure: PARTIAL MASTECTOMY WITH NEEDLE LOCALIZATION AND AXILLARY SENTINEL LYMPH NODE BIOPSY;  Surgeon: Monica Hector, MD;  Location: [facility];  Service: General;  Laterality: Right;     ALLERGIES:  Allergies  Allergen Reactions  . Codeine     REACTION: sick to stomach     CURRENT MEDICATIONS:  Outpatient Encounter Prescriptions as of 12/17/2015  Medication Sig Note  . alendronate (FOSAMAX) 70 MG tablet TAKE 1 TABLET BY MOUTH EVERY WEEK WITH A FULL GLASS OF WATER ON AN EMPTY STOMACH   . anastrozole (ARIMIDEX) 1 MG tablet Take 1  tablet (1 mg total) by mouth daily.   Monica Diaz THYROID 30 MG tablet TAKE 1 TABLET BY MOUTH EVERY DAY   . calcium citrate-vitamin D (CITRACAL+D) 315-200 MG-UNIT per tablet Take 1 tablet by mouth 2 (two) times daily.   . Coenzyme Q10 (COQ10) 200 MG CAPS Take 1 capsule by mouth at bedtime.   Marland Kitchen loratadine (CLARITIN) 10 MG tablet Take 10 mg by mouth daily as needed.    . mometasone (NASONEX) 50 MCG/ACT nasal spray Place 2 sprays into the nose daily as needed.   . Multiple Vitamins-Minerals (CENTRUM SILVER PO) Take 1 tablet by mouth.   Marland Kitchen omeprazole (PRILOSEC) 40 MG capsule Reported on 08/27/2015 03/10/2014: Received from: External Pharmacy  .  pyridoxine (B-6) 100 MG tablet Take 100 mg by mouth 2 (two) times daily.   . rosuvastatin (CRESTOR) 10 MG tablet TAKE 1 TABLET(10 MG) BY MOUTH DAILY   . vitamin B-12 (CYANOCOBALAMIN) 50 MCG tablet Take 50 mcg by mouth.   . vitamin C (ASCORBIC ACID) 500 MG tablet Take 500 mg by mouth 2 (two) times daily.    No facility-administered encounter medications on file as of 12/17/2015.      ONCOLOGIC FAMILY HISTORY:  Family History  Problem Relation Age of Onset  . Hypertension Mother   . Diabetes Maternal Aunt   . Stroke      grandfather    GENETIC COUNSELING/TESTING: None.  SOCIAL HISTORY:  Monica Diaz is divorced and lives alone in [place], [place].  She has 2 daughters; 1 daughter lives in the [place] area in [place], [place] and 1 daughter lives in [place] and is a college  professor.  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  She has her doctorate in education.    PHYSICAL EXAMINATION:  Vital Signs: Vitals:   12/17/15 1557  BP: (!) 120/92  Pulse: 72  Resp: 18  Temp: 98.1 F (36.7 C)   Filed Weights   12/17/15 1557  Weight: 167 lb 3.2 oz (75.8 kg)   General: Well-nourished, well-appearing female in no acute distress.  She is unaccompanied today.   HEENT: Head is normocephalic.  Pupils equal and reactive to light. Conjunctivae clear without exudate.  Sclerae anicteric. Oral mucosa is pink, moist.  Oropharynx is pink without lesions or erythema.  Lymph: No cervical, supraclavicular, or infraclavicular lymphadenopathy noted on palpation.  Cardiovascular: Regular rate and rhythm.Marland Kitchen Respiratory: Clear to auscultation bilaterally. Chest expansion symmetric; breathing non-labored.  Breast Exam:  -Left breast: No appreciable masses on palpation. No skin redness, thickening, or peau d'orange appearance; no nipple retraction or nipple discharge. -Right breast: No appreciable masses on palpation. No skin redness, thickening, or peau  d'orange appearance; no nipple retraction or nipple discharge; mild distortion in symmetry at previous lumpectomy site; healed scar without erythema or nodularity. -Axilla: No axillary adenopathy bilaterally.  GI: Abdomen soft and round; non-tender, non-distended. Bowel sounds normoactive. No hepatosplenomegaly.   GU: Deferred.  Neuro: No focal deficits. Steady gait.  Psych: Mood and affect normal and appropriate for situation.  Extremities: No edema. Skin: Warm and dry.  LABORATORY DATA:  CBC    Component Value Date/Time   WBC 5.3 12/17/2015 1449   WBC 5.3 08/27/2015 1357   RBC 3.79 12/17/2015 1449   RBC 4.01 08/27/2015 1357   HGB 11.3 (L) 12/17/2015 1449   HCT 34.3 (L) 12/17/2015 1449   PLT 252 12/17/2015 1449   MCV 90.6 12/17/2015 1449  MCH 29.8 12/17/2015 1449   MCH 29.9 05/13/2011 1414   MCHC 32.9 12/17/2015 1449   MCHC 33.2 08/27/2015 1357   RDW 14.6 (H) 12/17/2015 1449   LYMPHSABS 1.9 12/17/2015 1449   MONOABS 0.7 12/17/2015 1449   EOSABS 0.1 12/17/2015 1449   BASOSABS 0.0 12/17/2015 1449   CMP Latest Ref Rng & Units 12/17/2015 08/27/2015 05/21/2015  Glucose 70 - 140 mg/dl 85 98 86  BUN 7.0 - 26.0 mg/dL 13.0 12 10.6  Creatinine 0.6 - 1.1 mg/dL 0.9 0.91 1.0  Sodium 136 - 145 mEq/L 140 139 141  Potassium 3.5 - 5.1 mEq/L 4.0 4.0 4.2  Chloride 96 - 112 mEq/L - 105 -  CO2 22 - 29 mEq/L '24 28 28  ' Calcium 8.4 - 10.4 mg/dL 9.4 9.4 9.3  Total Protein 6.4 - 8.3 g/dL 7.4 7.4 7.3  Total Bilirubin 0.20 - 1.20 mg/dL 0.30 0.4 0.38  Alkaline Phos 40 - 150 U/L 47 39 44  AST 5 - 34 U/L '22 23 21  ' ALT 0 - 55 U/L '15 16 15    ' *Labs reviewed in detail with the patient and are largely stable.   DIAGNOSTIC IMAGING:  Most recent mammogram: 06/01/15    ASSESSMENT AND PLAN:  Ms.. Devargas is a pleasant 75 y.o. female with history of Stage IIA right breast invasive ductal carcinoma, ER+/PR+/HER2-, diagnosed in 04/2013, treated with lumpectomy, adjuvant radiation therapy, and anti-estrogen  therapy with Anastrazole beginning in 08/2013.  She [facility] for surveillance and routine follow-up.   1. History of Stage IIA right breast cancer:  Ms. Purk is currently clinically and radiographically without evidence of disease or recurrence of breast cancer. She will follow-up with her medical oncologist, Dr. Jana Diaz, in 6 months (05/2016) with history and physical exam per surveillance protocol.  She will continue her anti-estrogen therapy with anastrazole as prescribed by Dr. Jana Diaz. I encouraged her to call me with any questions or concerns before her next visit here at the cancer center and I would be happy to see her sooner, if needed.   2. Intermittent cough, probable acid reflux: Based on her trial of Prilosec with improved symptoms, the likely etiology of her cough is reflux.  It seems that the Fosamax is the offending agent for her.  As discussed at her previous visit with Dr. Jana Diaz, I offered that we could get her set-up for Prolia (denosumab) injections every 6 months, in lieu of the Fosamax which seems to be causing her GI distress.  She tells me that she hates needles, but if taking the Prolia would get rid of the nagging cough for her, then she would be willing to try it.  She would like to do some research herself and let us know if she would like to proceed with the Prolia injections.  I let her know that we could likely set-up her 1st injection to be in 05/2016 when she returns to the cancer center to see Dr. Jana Diaz. She will call our office to let us know what she decides.   3. Bone health:  Given Ms. Rueter's age, history of breast cancer, and her current treatment regimen including chemoprevention with anastrazole, she is at risk for bone demineralization.  Her last DEXA scan was on 04/21/14 and showed osteopenia.  She is currently on Fosamax and considering Prolia injections instead.  She will be due for repeat DEXA imaging at the discretion of Dr.  Jana Diaz. She was encouraged to increase her consumption of  foods rich in calcium, as well as increase her weight-bearing activities.  She was given education on specific food and activities to promote bone health.  4. Cancer screening:  She was encouraged to follow-up with her PCP for appropriate cancer screenings.   5. Health maintenance and wellness promotion: Ms. Moffat was encouraged to consume 5-7 servings of fruits and vegetables per day. She was also encouraged to engage in moderate to vigorous exercise for 30 minutes per day most days of the week. She was instructed to limit her alcohol consumption and continue to abstain from tobacco use.     Dispo:  -Return to cancer center to see Dr. Jana Diaz in 05/2016.  -Her mammogram will be due in 05/2016 as well; orders placed today.  -Pt will call us to let us know if she would like to stop Fosamax and start Prolia injections; her tentative 1st dose could be in 05/2016 when she returns to the cancer center if she chooses to proceed with Prolia.    A total of 35 minutes of face-to-face time was spent with this patient with greater than 50% of that time in counseling and care-coordination.   Mike Craze, NP Survivorship [facility] [phone]   Note: PRIMARY CARE PROVIDER Annye Asa, [place] [phone]

## 2016-01-14 ENCOUNTER — Other Ambulatory Visit: Payer: Self-pay | Admitting: Family Medicine

## 2016-02-11 ENCOUNTER — Other Ambulatory Visit: Payer: Self-pay | Admitting: Family Medicine

## 2016-03-03 ENCOUNTER — Ambulatory Visit: Payer: Self-pay | Admitting: Family Medicine

## 2016-03-21 ENCOUNTER — Encounter: Payer: Self-pay | Admitting: *Deleted

## 2016-03-21 NOTE — Progress Notes (Signed)
  Oncology Nurse Navigator Documentation  Navigator Location: [facility] (03/21/16 1408)   )Navigator Encounter Type: Telephone (03/21/16 1408)  I called patient to check in.  She reports that she is doing well.  She goes to the gym 3 times a week and walks 3 miles and does exercises with weights.  She says she tolerates the walk well with no pain or SOB and feels good following the walk.  She does experience some discomfort in both arms which may be from the exercises.  She does not notice any swelling in her right arm but does wear her compression sleeve which helps reduce discomfort.  She has experienced some swelling in her ankles at the end of the day which is better in the morning.  I encouraged her to elevate her feet when sitting.  She also experiences some joint stiffness.  She continues with an occasional cough which she has been told is secondary to reflux.  She is no longer taking Prilosec.  She has resumed taking her Fosamax and will discuss whether to continue Fosamax or change to Prolia with her PCP prior to her appointment with Dr. Jana Hakim in February.  We reviewed her future appointments.  I encouraged her to call me if she has any questions or needs.                       Treatment Phase: Survivorship (03/21/16 1408) Barriers/Navigation Needs: No barriers at this time (03/21/16 1408)   Interventions: None required (03/21/16 1408)                      Time Spent with Patient: 15 (03/21/16 1408)

## 2016-04-07 ENCOUNTER — Other Ambulatory Visit: Payer: Self-pay | Admitting: Family Medicine

## 2016-04-08 ENCOUNTER — Encounter: Payer: Self-pay | Admitting: Family Medicine

## 2016-04-08 ENCOUNTER — Ambulatory Visit (INDEPENDENT_AMBULATORY_CARE_PROVIDER_SITE_OTHER): Payer: Medicare Other | Admitting: Family Medicine

## 2016-04-08 VITALS — BP 116/81 | HR 73 | Temp 98.2°F | Resp 16 | Ht 64.0 in | Wt 165.5 lb

## 2016-04-08 DIAGNOSIS — M818 Other osteoporosis without current pathological fracture: Secondary | ICD-10-CM

## 2016-04-08 DIAGNOSIS — E785 Hyperlipidemia, unspecified: Secondary | ICD-10-CM

## 2016-04-08 DIAGNOSIS — I6529 Occlusion and stenosis of unspecified carotid artery: Secondary | ICD-10-CM | POA: Diagnosis not present

## 2016-04-08 DIAGNOSIS — E039 Hypothyroidism, unspecified: Secondary | ICD-10-CM | POA: Diagnosis not present

## 2016-04-08 LAB — BASIC METABOLIC PANEL
BUN: 9 mg/dL (ref 6–23)
CHLORIDE: 104 meq/L (ref 96–112)
CO2: 29 mEq/L (ref 19–32)
CREATININE: 0.86 mg/dL (ref 0.40–1.20)
Calcium: 9.2 mg/dL (ref 8.4–10.5)
GFR: 82.98 mL/min (ref >60.00)
GLUCOSE: 97 mg/dL (ref 70–99)
POTASSIUM: 4.2 meq/L (ref 3.5–5.1)
Sodium: 139 mEq/L (ref 135–145)

## 2016-04-08 LAB — CBC WITH DIFFERENTIAL/PLATELET
BASOS ABS: 0 10*3/uL (ref 0.0–0.1)
Basophils Relative: 0.2 % (ref 0.0–3.0)
EOS ABS: 0.1 10*3/uL (ref 0.0–0.7)
EOS PCT: 1.3 % (ref 0.0–5.0)
HCT: 35.2 % — ABNORMAL LOW (ref 36.0–46.0)
HEMOGLOBIN: 11.7 g/dL — AB (ref 12.0–15.0)
LYMPHS ABS: 1.6 10*3/uL (ref 0.7–4.0)
Lymphocytes Relative: 31.4 % (ref 12.0–46.0)
MCHC: 33.2 g/dL (ref 30.0–36.0)
MCV: 89.6 fl (ref 78.0–100.0)
MONO ABS: 0.5 10*3/uL (ref 0.1–1.0)
Monocytes Relative: 10 % (ref 3.0–12.0)
NEUTROS PCT: 57.1 % (ref 43.0–77.0)
Neutro Abs: 2.9 10*3/uL (ref 1.4–7.7)
Platelets: 294 10*3/uL (ref 150.0–400.0)
RBC: 3.93 Mil/uL (ref 3.87–5.11)
RDW: 15.6 % — ABNORMAL HIGH (ref 11.5–15.5)
WBC: 5.1 10*3/uL (ref 4.0–10.5)

## 2016-04-08 LAB — LIPID PANEL
CHOL/HDL RATIO: 2
CHOLESTEROL: 146 mg/dL (ref 0–200)
HDL: 58.3 mg/dL (ref >39.00)
LDL Cholesterol: 75 mg/dL (ref 0–99)
NonHDL: 87.3
TRIGLYCERIDES: 64 mg/dL (ref 0.0–149.0)
VLDL: 12.8 mg/dL (ref 0.0–40.0)

## 2016-04-08 LAB — HEPATIC FUNCTION PANEL
ALBUMIN: 4.1 g/dL (ref 3.5–5.2)
ALT: 16 U/L (ref 0–35)
AST: 22 U/L (ref 0–37)
Alkaline Phosphatase: 49 U/L (ref 39–117)
Bilirubin, Direct: 0.1 mg/dL (ref 0.0–0.3)
TOTAL PROTEIN: 7.1 g/dL (ref 6.0–8.3)
Total Bilirubin: 0.4 mg/dL (ref 0.2–1.2)

## 2016-04-08 LAB — TSH: TSH: 3.4 u[IU]/mL (ref 0.35–4.50)

## 2016-04-08 NOTE — Progress Notes (Signed)
   Subjective:    Patient ID: Monica Diaz, female    DOB: 05-09-1940, 75 y.o.   MRN: [id number]  HPI Hypothyroid- chronic problem, on Armour Thyroid daily.  Denies fatigue, changes to skin, hair, nails.  Hyperlipidemia- chronic problem, on Crestor 10mg  daily.  Pt is having multiple joint aches and pains.  Increased stiffness despite regular exercise and activity.  Denies abd pain, N/V.  Carotid artery stenosis- pt was told by a recent [facility] assessment that she had 'blockage'.  Pt has not had carotid doppler w/ anyone but the [facility] screening group.  Osteoporosis- pt is consider switching from Fosamax to Prolia in order to continue taking the GERD medication.  Review of Systems For ROS see HPI     Objective:   Physical Exam  Constitutional: She is oriented to person, place, and time. She appears well-developed and well-nourished. No distress.  HENT:  Head: Normocephalic and atraumatic.  Eyes: Conjunctivae and EOM are normal. Pupils are equal, round, and reactive to light.  Neck: Normal range of motion. Neck supple. No thyromegaly present.  Cardiovascular: Normal rate, regular rhythm, normal heart sounds and intact distal pulses.   No murmur heard. Pulmonary/Chest: Effort normal and breath sounds normal. No respiratory distress.  Abdominal: Soft. She exhibits no distension. There is no tenderness.  Musculoskeletal: She exhibits no edema.  Lymphadenopathy:    She has no cervical adenopathy.  Neurological: She is alert and oriented to person, place, and time.  Skin: Skin is warm and dry.  Psychiatric: She has a normal mood and affect. Her behavior is normal.  Vitals reviewed.         Assessment & Plan:

## 2016-04-08 NOTE — Assessment & Plan Note (Signed)
Chronic problem, on Armour thyroid daily.  Currently asymptomatic.  Check labs.  Adjust meds prn

## 2016-04-08 NOTE — Progress Notes (Signed)
Pre visit review using our clinic review tool, if applicable. No additional management support is needed unless otherwise documented below in the visit note. 

## 2016-04-08 NOTE — Assessment & Plan Note (Signed)
New.  Pt mentioned that the [facility] assessment she had done mentioned that she had carotid artery stenosis.  She is not sure to what degree there is stenosis and she could not find the report.  Will get an official doppler done to assess

## 2016-04-08 NOTE — Assessment & Plan Note (Signed)
Chronic problem.  Pt is considering switching to Prolia from Fosmax so that she has less GERD sxs.  Reviewed that it is a twice yearly injxn and the data behind it.  Pt given a brochure for additional reading.  She is to let me know what she decides.

## 2016-04-08 NOTE — Patient Instructions (Signed)
Schedule your complete physical in 6 months We'll notify you of your lab results and make any changes if needed Keep up the good work on healthy diet and regular exercise- you look great!!! Consider switching to Prolia for the management of your osteoporosis Start a Tylenol Arthritis once daily for the joint aches and pains and see if this improves your symptoms We'll call you with your carotid artery appointment for a formal scan Call with any questions or concerns [facility]!!! Happy Holidays!!!

## 2016-04-08 NOTE — Assessment & Plan Note (Signed)
Chronic problem.  On Statin w/o difficulty.  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  Reassurance provided.  Will follow.

## 2016-04-11 ENCOUNTER — Ambulatory Visit (HOSPITAL_BASED_OUTPATIENT_CLINIC_OR_DEPARTMENT_OTHER)
Admission: RE | Admit: 2016-04-11 | Discharge: 2016-04-11 | Disposition: A | Discharge status: Home / Self Care | Payer: Medicare Other | Source: Ambulatory Visit | Attending: Family Medicine | Admitting: Family Medicine

## 2016-04-11 DIAGNOSIS — I6529 Occlusion and stenosis of unspecified carotid artery: Secondary | ICD-10-CM

## 2016-04-11 DIAGNOSIS — I6521 Occlusion and stenosis of right carotid artery: Secondary | ICD-10-CM | POA: Insufficient documentation

## 2016-05-03 ENCOUNTER — Other Ambulatory Visit: Payer: Self-pay | Admitting: Family Medicine

## 2016-05-05 ENCOUNTER — Other Ambulatory Visit: Payer: Self-pay | Admitting: Family Medicine

## 2016-06-02 ENCOUNTER — Ambulatory Visit
Admission: RE | Admit: 2016-06-02 | Discharge: 2016-06-02 | Disposition: A | Discharge status: Home / Self Care | Payer: Medicare Other | Source: Ambulatory Visit | Attending: Adult Health | Admitting: Adult Health

## 2016-06-02 DIAGNOSIS — C50411 Malignant neoplasm of upper-outer quadrant of right female breast: Secondary | ICD-10-CM

## 2016-06-02 HISTORY — DX: Malignant neoplasm of unspecified site of unspecified female breast: C50.919

## 2016-06-02 HISTORY — DX: Personal history of irradiation: Z92.3

## 2016-06-06 ENCOUNTER — Encounter: Payer: Self-pay | Admitting: General Practice

## 2016-06-11 ENCOUNTER — Other Ambulatory Visit: Payer: Self-pay | Admitting: Oncology

## 2016-06-12 ENCOUNTER — Other Ambulatory Visit: Payer: Self-pay | Admitting: Family Medicine

## 2016-06-13 MED ORDER — MOMETASONE FUROATE 50 MCG/ACT NA SUSP: 2.0000 | Freq: Every day | NASAL | 6 refills | Status: DC | PRN

## 2016-06-15 ENCOUNTER — Other Ambulatory Visit: Payer: Self-pay | Admitting: Adult Health

## 2016-06-15 DIAGNOSIS — C50411 Malignant neoplasm of upper-outer quadrant of right female breast: Secondary | ICD-10-CM

## 2016-06-15 DIAGNOSIS — Z170 Estrogen receptor positive status [ER+]: Secondary | ICD-10-CM

## 2016-06-15 DIAGNOSIS — E559 Vitamin D deficiency, unspecified: Secondary | ICD-10-CM

## 2016-06-16 ENCOUNTER — Ambulatory Visit (HOSPITAL_BASED_OUTPATIENT_CLINIC_OR_DEPARTMENT_OTHER): Payer: Medicare Other | Admitting: Oncology

## 2016-06-16 ENCOUNTER — Other Ambulatory Visit (HOSPITAL_BASED_OUTPATIENT_CLINIC_OR_DEPARTMENT_OTHER): Payer: Medicare Other

## 2016-06-16 ENCOUNTER — Encounter: Payer: Self-pay | Admitting: *Deleted

## 2016-06-16 VITALS — BP 138/83 | HR 77 | Temp 98.1°F | Resp 18 | Ht 64.0 in | Wt 163.1 lb

## 2016-06-16 DIAGNOSIS — Z79811 Long term (current) use of aromatase inhibitors: Secondary | ICD-10-CM

## 2016-06-16 DIAGNOSIS — Z170 Estrogen receptor positive status [ER+]: Secondary | ICD-10-CM | POA: Diagnosis not present

## 2016-06-16 DIAGNOSIS — M8580 Other specified disorders of bone density and structure, unspecified site: Secondary | ICD-10-CM

## 2016-06-16 DIAGNOSIS — C50911 Malignant neoplasm of unspecified site of right female breast: Secondary | ICD-10-CM

## 2016-06-16 DIAGNOSIS — C50411 Malignant neoplasm of upper-outer quadrant of right female breast: Secondary | ICD-10-CM

## 2016-06-16 DIAGNOSIS — E559 Vitamin D deficiency, unspecified: Secondary | ICD-10-CM

## 2016-06-16 LAB — CBC WITH DIFFERENTIAL/PLATELET
BASO%: 0.2 % (ref 0.0–2.0)
Basophils Absolute: 0 10*3/uL (ref 0.0–0.1)
EOS%: 1.7 % (ref 0.0–7.0)
Eosinophils Absolute: 0.1 10*3/uL (ref 0.0–0.5)
HEMATOCRIT: 36.3 % (ref 34.8–46.6)
HEMOGLOBIN: 11.8 g/dL (ref 11.6–15.9)
LYMPH%: 34.9 % (ref 14.0–49.7)
MCH: 28.9 pg (ref 25.1–34.0)
MCHC: 32.5 g/dL (ref 31.5–36.0)
MCV: 88.8 fL (ref 79.5–101.0)
MONO#: 0.5 10*3/uL (ref 0.1–0.9)
MONO%: 8.9 % (ref 0.0–14.0)
NEUT#: 3.2 10*3/uL (ref 1.5–6.5)
NEUT%: 54.3 % (ref 38.4–76.8)
Platelets: 235 10*3/uL (ref 145–400)
RBC: 4.09 10*6/uL (ref 3.70–5.45)
RDW: 15.1 % — ABNORMAL HIGH (ref 11.2–14.5)
WBC: 5.8 10*3/uL (ref 3.9–10.3)
lymph#: 2 10*3/uL (ref 0.9–3.3)

## 2016-06-16 LAB — COMPREHENSIVE METABOLIC PANEL
ALK PHOS: 55 U/L (ref 40–150)
ALT: 12 U/L (ref 0–55)
AST: 18 U/L (ref 5–34)
Albumin: 3.6 g/dL (ref 3.5–5.0)
Anion Gap: 8 mEq/L (ref 3–11)
BUN: 8.7 mg/dL (ref 7.0–26.0)
CHLORIDE: 106 meq/L (ref 98–109)
CO2: 24 mEq/L (ref 22–29)
CREATININE: 0.8 mg/dL (ref 0.6–1.1)
Calcium: 9.4 mg/dL (ref 8.4–10.4)
EGFR: 79 mL/min/{1.73_m2} — ABNORMAL LOW (ref >90)
GLUCOSE: 92 mg/dL (ref 70–140)
POTASSIUM: 4.1 meq/L (ref 3.5–5.1)
SODIUM: 139 meq/L (ref 136–145)
Total Bilirubin: 0.36 mg/dL (ref 0.20–1.20)
Total Protein: 7.6 g/dL (ref 6.4–8.3)

## 2016-06-16 NOTE — Progress Notes (Signed)
  Oncology Nurse Navigator Documentation  Navigator Location: [facility] (06/16/16 1430)   )Navigator Encounter Type: Follow-up Appt (06/16/16 1430)  Patient at [facility] for follow up visit with Dr. Jana Hakim.  She reports that she is doing well.  Dr. Jana Hakim gave her a good report and instructed her to return in one year.  She denies any questions or concerns at this time.  I encouraged her to call me for any needs.  Patient verbalized understanding.                   Patient Visit Type: MedOnc (06/16/16 1430) Treatment Phase: Follow-up (06/16/16 1430) Barriers/Navigation Needs: No barriers at this time (06/16/16 1430)   Interventions: None required (06/16/16 1430)                      Time Spent with Patient: 15 (06/16/16 1430)

## 2016-06-16 NOTE — Progress Notes (Signed)
[facility]  Telephone:(336) [phone] Fax:(336) [phone]     ID: Monica Diaz OB: [birth year]  MR#: [id number]  ASN#:[id number]  PCP: Annye Asa, MD GYN:   SU: [name] OTHER MD: Jaquita Folds Dohmier, [name], [place] Gramig  CHIEF COMPLAINT: Estrogen receptor positive breast cancer  CURRENT TREATMENT: Anastrozole  BREAST CANCER HISTORY: From the original intake note:  Monica Diaz had bilateral screening mammography at the breast center 04/15/2013. This showed a possible mass in the right breast. Diagnostic right mammography and ultrasonography 05/09/2013 showed an irregular area of focal asymmetry in the right breast, which was not palpable. Ultrasound showed dystrophic calcification close to the area of asymmetry. The area of abnormality measured approximately 1.4 cm.  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 HER-2 was reported as negative during the multidisciplinary breast cancer conference 05/22/2013. The written report is pending.   On 05/20/2013 the patient underwent bilateral breast MRI which showed a 2.3 cm irregular enhancing mass in the right breast at the 12:00 position. There were no other areas of concern in either breast and there were no abnormal appearing lymph nodes. However incidentally a 6 mm enhancing right paramedian sternal lesion was noted.  The patient's subsequent history is as detailed below   INTERVAL HISTORY: Monica Diaz returns today for follow-up of her estrogen receptor positive breast cancer. She continues on anastrozole, with good tolerance.  Hot flashes and vaginal dryness are not a major issue. She never developed the arthralgias or myalgias that many patients can experience on this medication.  She obtains it at a good price.  REVIEW OF SYSTEMS: Monica Diaz biggest problem she is having his joint pain, which localizes chiefly to her knees. She goes to the gym about 3 days per week and does quite a bit of walking in addition. Sometimes her ankles swell. She complains of hearing loss, sinus problems, and mild stress urinary incontinence. A detailed review of systems today was otherwise stable  PAST MEDICAL HISTORY: Past Medical History:  Diagnosis Date  . Arthritis   . Breast cancer ([facility]) 2015  . Cancer ([facility]) 2015   breast  . HOH (hard of hearing)   . Hx of radiation therapy 07/18/13-08/14/13   right breast total dose 50 Gy  . Hyperlipidemia   . Osteoporosis   . Personal history of radiation therapy 2015  . Seasonal allergies   . Sleep apnea    uses a cpap  . Thyroid disease   . Wears glasses     PAST SURGICAL HISTORY: Past Surgical History:  Procedure Laterality Date  . ABDOMINAL HYSTERECTOMY     fibroids  . BREAST BIOPSY Right 2015   core   . BREAST EXCISIONAL BIOPSY Right 2005  . COLONOSCOPY    . LEFT OOPHORECTOMY    . PARTIAL MASTECTOMY WITH NEEDLE LOCALIZATION AND AXILLARY SENTINEL LYMPH NODE BX Right 05/28/2013   Procedure: PARTIAL MASTECTOMY WITH NEEDLE LOCALIZATION AND AXILLARY SENTINEL LYMPH NODE BIOPSY;  Surgeon: Adin Hector, MD;  Location: [facility];  Service: General;  Laterality: Right;    FAMILY HISTORY Family History  Problem Relation Age of Onset  . Hypertension Mother   . Diabetes Maternal Aunt   . Stroke      grandfather  The patient knows little about her father.  Her mother is living, 52 years old as of January 2015. The patient was an only child. There is no history of breast or ovarian cancer in the family  GYNECOLOGIC HISTORY:  Menarche age 89, first live birth age 17, the patient is [place] P2. 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 She is divorced and lives alone with [name], who are two nonverbal parakeets. Her daughter Monica Diaz is a housewife in [place]. Her daughter Monica Diaz is a college professor in [employer], teaching urban education. The patient's niece Monica Chase-Gregory MD is a neurologist working at [place]: Not in place. The patient was given the appropriate documents at the time of her first visit here 05/22/2013 to complete and notarize at her discretion. She intends to name her daughter [name] as her healthcare power of attorney. She can be reached at [phone]   HEALTH MAINTENANCE: Social History  Substance Use Topics  . Smoking status: Never Smoker  . Smokeless tobacco: Never Used  . Alcohol use Yes     Comment: occasionally     Colonoscopy:  PAP:  Bone density:04/21/2014 showed mild osteopenia with a T score of -1.4  Lipid panel:  Allergies  Allergen Reactions  . Codeine     REACTION: sick to stomach    Current Outpatient Prescriptions  Medication Sig Dispense Refill  . alendronate (FOSAMAX) 70 MG tablet TAKE 1 TABLET BY MOUTH EVERY WEEK WITH A FULL GLASS OF WATER ON AN EMPTY STOMACH 12 tablet 1  . anastrozole (ARIMIDEX) 1 MG tablet TAKE 1 TABLET(1 MG) BY MOUTH DAILY 90 tablet 0  . ARMOUR THYROID 30 MG tablet TAKE 1 TABLET BY MOUTH EVERY DAY 30 tablet 6  . calcium citrate-vitamin D (CITRACAL+D) 315-200 MG-UNIT per tablet Take 1 tablet by mouth 2 (two) times daily.    . Coenzyme Q10 (COQ10) 200 MG CAPS Take 1 capsule by mouth at bedtime.    Marland Kitchen loratadine (CLARITIN) 10 MG tablet Take 10 mg by mouth daily as needed.     . Magnesium 500 MG CAPS Take by mouth.    . mometasone (NASONEX) 50 MCG/ACT nasal spray Place 2 sprays into the nose daily as needed. 17 g 6  . Multiple Vitamins-Minerals (CENTRUM SILVER PO) Take 1 tablet by  mouth.    Marland Kitchen omeprazole (PRILOSEC) 40 MG capsule Reported on 08/27/2015  3  . pyridoxine (B-6) 100 MG tablet Take 100 mg by mouth 2 (two) times daily.    . rosuvastatin (CRESTOR) 10 MG tablet TAKE 1 TABLET(10 MG) BY MOUTH DAILY 30 tablet 6  . vitamin B-12 (CYANOCOBALAMIN) 50 MCG tablet Take 50 mcg by mouth.    . vitamin C (ASCORBIC ACID) 500 MG tablet Take 500 mg by mouth 2 (two) times daily.     No current facility-administered medications for this visit.     OBJECTIVE: Middle-aged [place] American woman In no acute distress Vitals:   06/16/16 1400  BP: 138/83  Pulse: 77  Resp: 18  Temp: 98.1 F (36.7 C)     Body mass index is 28 kg/m.    ECOG FS:1 - Symptomatic but completely ambulatory  Sclerae unicteric, EOMs intact Oropharynx clear and moist No cervical or supraclavicular adenopathy Lungs no rales or rhonchi Heart regular rate and rhythm Abd soft, nontender,  positive bowel sounds MSK no focal spinal tenderness, barely perceptible right upper extremity lymphedema Neuro: nonfocal, well oriented, appropriate affect Breasts: The right breast has undergone lumpectomy and radiation with no evidence of local recurrence. The left breast is unremarkable. Both axillae are benign.   LAB RESULTS:  CMP     Component Value Date/Time   NA 139 06/16/2016 1300   K 4.1 06/16/2016 1300   CL 104 04/08/2016 1103   CO2 24 06/16/2016 1300   GLUCOSE 92 06/16/2016 1300   BUN 8.7 06/16/2016 1300   CREATININE 0.8 06/16/2016 1300   CALCIUM 9.4 06/16/2016 1300   PROT 7.6 06/16/2016 1300   ALBUMIN 3.6 06/16/2016 1300   AST 18 06/16/2016 1300   ALT 12 06/16/2016 1300   ALKPHOS 55 06/16/2016 1300   BILITOT 0.36 06/16/2016 1300   GFRNONAA 66.33 03/25/2010 0828    I No results found for: SPEP  Lab Results  Component Value Date   WBC 5.8 06/16/2016   NEUTROABS 3.2 06/16/2016   HGB 11.8 06/16/2016   HCT 36.3 06/16/2016   MCV 88.8 06/16/2016   PLT 235 06/16/2016      Chemistry       Component Value Date/Time   NA 139 06/16/2016 1300   K 4.1 06/16/2016 1300   CL 104 04/08/2016 1103   CO2 24 06/16/2016 1300   BUN 8.7 06/16/2016 1300   CREATININE 0.8 06/16/2016 1300      Component Value Date/Time   CALCIUM 9.4 06/16/2016 1300   ALKPHOS 55 06/16/2016 1300   AST 18 06/16/2016 1300   ALT 12 06/16/2016 1300   BILITOT 0.36 06/16/2016 1300       No results found for: LABCA2  No components found for: LABCA125  No results for input(s): INR in the last 168 hours.  Urinalysis No results found for: COLORURINE  STUDIES:Mm Diag Breast Tomo Bilateral  Result Date: 06/02/2016 CLINICAL DATA:  History of right breast cancer in 2015 status post breast conservation surgery. EXAM: 2D DIGITAL DIAGNOSTIC BILATERAL MAMMOGRAM WITH CAD AND ADJUNCT TOMO COMPARISON:  Previous exam(s). ACR Breast Density Category c: The breast tissue is heterogeneously dense, which may obscure small masses. FINDINGS: There are expected postsurgical changes within the right breast. There are no new dominant masses, suspicious calcifications or secondary signs of malignancy within either breast. Mammographic images were processed with CAD. IMPRESSION: No evidence of malignancy within either breast. Postsurgical changes within the right breast. RECOMMENDATION: Bilateral diagnostic mammogram in 1 year. I have discussed the findings and recommendations with the patient. Results were also provided in writing at the conclusion of the visit. If applicable, a reminder letter will be sent to the patient regarding the next appointment. BI-RADS CATEGORY  2: Benign. Electronically Signed   By: Franki Cabot M.D.   On: 06/02/2016 11:34    ASSESSMENT: 76 y.o. [place] woman status post right breast biopsy 05/14/2013 for a clinical T2 N0, stage IIA invasive ductal carcinoma, grade 2 or 3, estrogen receptor 99% positive, progesterone receptor 50% positive, with an MIB-1 of 91%, and no HER-2 amplification  (1) 6 mm  enhancing right sternal lesion noted by breast MRI 05/20/2013-- negative on PET 06/04/2013  (2) status post right lumpectomy and sentinel lymph node sampling to 07/12/2013 for a pT2 pN0, stage IIA invasive ductal carcinoma, mucinous, grade 2, with repeat HER-2 again negative.  (3) Oncotype score of 18 predicts a 10 year risk of outside the breast recurrence of 12% if the patient's only systemic treatment is  tamoxifen for 5 years. It also predicts a minimal benefit from chemotherapy.  (4) adjuvant radiation completed 08/14/2013  (5) anastrozole started 09/06/2013  (a); repeat bone density scan 04/13/2014 showed osteopenia with a T score of -1.4  PLAN: Makyia is now 3 years out from definitive surgery for her breast cancer with no evidence of disease recurrence. This is very favorable.  She is tolerating the anastrozole well and the plan is to continue that to a total of 5 years.  I don't believe the pain in her knees is related to the anastrozole. This is going to be arthritis. She could be exacerbating the problem by her current exercise routine. I suggested first of all that she get very comfortable shoes. Secondly she is going to avoid the treadmill and do more elliptical, water aerobics, and stationary bike. Hopefully after a few weeks of this she will notice an improvement.  She will be due for repeat bone density late this year and I placed the order.  Otherwise she will return to see me in one year. She knows to call for any problems that may develop before that visit.    Chauncey Cruel, MD   06/16/2016 2:05 PM

## 2016-06-17 LAB — VITAMIN D 25 HYDROXY (VIT D DEFICIENCY, FRACTURES): VIT D 25 HYDROXY: 38.8 ng/mL (ref 30.0–100.0)

## 2016-08-15 ENCOUNTER — Encounter: Payer: Self-pay | Admitting: Family Medicine

## 2016-08-15 ENCOUNTER — Ambulatory Visit (INDEPENDENT_AMBULATORY_CARE_PROVIDER_SITE_OTHER): Payer: Medicare Other | Admitting: Family Medicine

## 2016-08-15 VITALS — BP 132/82 | HR 92 | Temp 98.1°F | Resp 17 | Ht 64.0 in | Wt 160.4 lb

## 2016-08-15 DIAGNOSIS — R600 Localized edema: Secondary | ICD-10-CM | POA: Diagnosis not present

## 2016-08-15 LAB — CBC WITH DIFFERENTIAL/PLATELET
BASOS ABS: 0 10*3/uL (ref 0.0–0.1)
BASOS PCT: 0.3 % (ref 0.0–3.0)
EOS ABS: 0 10*3/uL (ref 0.0–0.7)
Eosinophils Relative: 0.7 % (ref 0.0–5.0)
HEMATOCRIT: 35.9 % — AB (ref 36.0–46.0)
HEMOGLOBIN: 11.8 g/dL — AB (ref 12.0–15.0)
LYMPHS PCT: 31.9 % (ref 12.0–46.0)
Lymphs Abs: 2 10*3/uL (ref 0.7–4.0)
MCHC: 32.9 g/dL (ref 30.0–36.0)
MCV: 90.1 fl (ref 78.0–100.0)
MONO ABS: 0.7 10*3/uL (ref 0.1–1.0)
Monocytes Relative: 10.7 % (ref 3.0–12.0)
NEUTROS ABS: 3.6 10*3/uL (ref 1.4–7.7)
Neutrophils Relative %: 56.4 % (ref 43.0–77.0)
PLATELETS: 311 10*3/uL (ref 150.0–400.0)
RBC: 3.98 Mil/uL (ref 3.87–5.11)
RDW: 15.6 % — AB (ref 11.5–15.5)
WBC: 6.3 10*3/uL (ref 4.0–10.5)

## 2016-08-15 LAB — BASIC METABOLIC PANEL
BUN: 11 mg/dL (ref 6–23)
CALCIUM: 9.6 mg/dL (ref 8.4–10.5)
CHLORIDE: 104 meq/L (ref 96–112)
CO2: 26 meq/L (ref 19–32)
CREATININE: 0.89 mg/dL (ref 0.40–1.20)
GFR: 79.69 mL/min (ref >60.00)
GLUCOSE: 85 mg/dL (ref 70–99)
Potassium: 4.2 mEq/L (ref 3.5–5.1)
SODIUM: 137 meq/L (ref 135–145)

## 2016-08-15 LAB — TSH: TSH: 2.09 u[IU]/mL (ref 0.35–4.50)

## 2016-08-15 LAB — BRAIN NATRIURETIC PEPTIDE: PRO B NATRI PEPTIDE: 15 pg/mL (ref 0.0–100.0)

## 2016-08-15 LAB — HEPATIC FUNCTION PANEL
ALT: 12 U/L (ref 0–35)
AST: 18 U/L (ref 0–37)
Albumin: 4 g/dL (ref 3.5–5.2)
Alkaline Phosphatase: 47 U/L (ref 39–117)
BILIRUBIN TOTAL: 0.4 mg/dL (ref 0.2–1.2)
Bilirubin, Direct: 0.1 mg/dL (ref 0.0–0.3)
TOTAL PROTEIN: 7.6 g/dL (ref 6.0–8.3)

## 2016-08-15 MED ORDER — FUROSEMIDE 20 MG PO TABS: 20.0000 mg | ORAL_TABLET | Freq: Every day | ORAL | 3 refills | Status: DC

## 2016-08-15 NOTE — Progress Notes (Signed)
Pre visit review using our clinic review tool, if applicable. No additional management support is needed unless otherwise documented below in the visit note. 

## 2016-08-15 NOTE — Progress Notes (Signed)
   Subjective:    Patient ID: DEMIRA GWYNNE, female    DOB: 1941-03-13, 76 y.o.   MRN: [id number]  HPI Foot swelling- pt reports sxs started 4-6 weeks ago.  Pt reports she was having difficulty wearing 'regular shoes'.  Feet were painful due to swelling.  No medication changes.  Pt denies change in dietary habits.  No change in hand swelling (has had this since hand/wrist surgery).  Pt went to UC and was told 'it's not your heart or lungs'.     Review of Systems For ROS see HPI     Objective:   Physical Exam  Constitutional: She is oriented to person, place, and time. She appears well-developed and well-nourished. No distress.  HENT:  Head: Normocephalic and atraumatic.  Eyes: Conjunctivae and EOM are normal. Pupils are equal, round, and reactive to light.  Neck: Normal range of motion. Neck supple. No thyromegaly present.  Cardiovascular: Normal rate, regular rhythm, normal heart sounds and intact distal pulses.   No murmur heard. Pulmonary/Chest: Effort normal and breath sounds normal. No respiratory distress.  Abdominal: Soft. She exhibits no distension. There is no tenderness.  Musculoskeletal: She exhibits edema (trace, nonpitting edema bilaterally).  Lymphadenopathy:    She has no cervical adenopathy.  Neurological: She is alert and oriented to person, place, and time.  Skin: Skin is warm and dry.  Psychiatric: She has a normal mood and affect. Her behavior is normal.  Vitals reviewed.         Assessment & Plan:  Edema- new.  Pt reports she has had 4-6 weeks of sxs.  Denies med changes.  Does eat out a lot.  Denies CP, SOB that would be concerning for heart failure.  Check BNP to r/o CHF.  Check CBC to r/o anemia, CMP to assess electrolytes, thyroid to r/o hypothyroidism.  Encouraged increased fluids, low Na diet.  Lasix prn.  Will continue to follow closely.  Pt expressed understanding and is in agreement w/ plan.

## 2016-08-15 NOTE — Patient Instructions (Signed)
Follow up as needed We'll notify you of your lab results and make any changes if needed Continue to drink plenty of fluids Limit your salt intake Use the Furosemide (lasix) as needed for swelling- this is a water pill Call with any questions or concerns [place]!!!

## 2016-09-07 ENCOUNTER — Ambulatory Visit: Payer: Medicare Other | Admitting: Family Medicine

## 2016-09-08 ENCOUNTER — Other Ambulatory Visit: Payer: Self-pay | Admitting: Oncology

## 2016-09-12 ENCOUNTER — Telehealth: Payer: Self-pay

## 2016-09-12 NOTE — Telephone Encounter (Signed)
LM requesting call back regarding AWV. LM advising patient she would be seeing PCP for CPE, followed by [facility] with [facility].

## 2016-09-14 ENCOUNTER — Ambulatory Visit (INDEPENDENT_AMBULATORY_CARE_PROVIDER_SITE_OTHER): Payer: Medicare Other

## 2016-09-14 ENCOUNTER — Ambulatory Visit (INDEPENDENT_AMBULATORY_CARE_PROVIDER_SITE_OTHER): Payer: Medicare Other | Admitting: Family Medicine

## 2016-09-14 ENCOUNTER — Encounter: Payer: Self-pay | Admitting: Family Medicine

## 2016-09-14 VITALS — BP 130/83 | HR 94 | Temp 98.0°F | Resp 16 | Ht 64.0 in | Wt 158.5 lb

## 2016-09-14 DIAGNOSIS — E559 Vitamin D deficiency, unspecified: Secondary | ICD-10-CM | POA: Diagnosis not present

## 2016-09-14 DIAGNOSIS — R05 Cough: Secondary | ICD-10-CM

## 2016-09-14 DIAGNOSIS — Z0000 Encounter for general adult medical examination without abnormal findings: Secondary | ICD-10-CM | POA: Diagnosis not present

## 2016-09-14 DIAGNOSIS — M818 Other osteoporosis without current pathological fracture: Secondary | ICD-10-CM

## 2016-09-14 DIAGNOSIS — M79671 Pain in right foot: Secondary | ICD-10-CM

## 2016-09-14 DIAGNOSIS — M79672 Pain in left foot: Secondary | ICD-10-CM

## 2016-09-14 DIAGNOSIS — E785 Hyperlipidemia, unspecified: Secondary | ICD-10-CM | POA: Diagnosis not present

## 2016-09-14 DIAGNOSIS — R053 Chronic cough: Secondary | ICD-10-CM

## 2016-09-14 DIAGNOSIS — E039 Hypothyroidism, unspecified: Secondary | ICD-10-CM | POA: Diagnosis not present

## 2016-09-14 LAB — HEPATIC FUNCTION PANEL
ALT: 12 U/L (ref 0–35)
AST: 19 U/L (ref 0–37)
Albumin: 4.1 g/dL (ref 3.5–5.2)
Alkaline Phosphatase: 49 U/L (ref 39–117)
BILIRUBIN TOTAL: 0.4 mg/dL (ref 0.2–1.2)
Bilirubin, Direct: 0.1 mg/dL (ref 0.0–0.3)
Total Protein: 7.6 g/dL (ref 6.0–8.3)

## 2016-09-14 LAB — CBC WITH DIFFERENTIAL/PLATELET
BASOS ABS: 0 10*3/uL (ref 0.0–0.1)
Basophils Relative: 0.4 % (ref 0.0–3.0)
Eosinophils Absolute: 0.1 10*3/uL (ref 0.0–0.7)
Eosinophils Relative: 1.4 % (ref 0.0–5.0)
HEMATOCRIT: 36 % (ref 36.0–46.0)
HEMOGLOBIN: 11.9 g/dL — AB (ref 12.0–15.0)
LYMPHS PCT: 31.3 % (ref 12.0–46.0)
Lymphs Abs: 1.8 10*3/uL (ref 0.7–4.0)
MCHC: 33 g/dL (ref 30.0–36.0)
MCV: 89.6 fl (ref 78.0–100.0)
MONO ABS: 0.7 10*3/uL (ref 0.1–1.0)
Monocytes Relative: 11.6 % (ref 3.0–12.0)
Neutro Abs: 3.2 10*3/uL (ref 1.4–7.7)
Neutrophils Relative %: 55.3 % (ref 43.0–77.0)
Platelets: 296 10*3/uL (ref 150.0–400.0)
RBC: 4.01 Mil/uL (ref 3.87–5.11)
RDW: 15.5 % (ref 11.5–15.5)
WBC: 5.7 10*3/uL (ref 4.0–10.5)

## 2016-09-14 LAB — LIPID PANEL
CHOL/HDL RATIO: 3
Cholesterol: 138 mg/dL (ref 0–200)
HDL: 51.3 mg/dL (ref >39.00)
LDL Cholesterol: 71 mg/dL (ref 0–99)
NONHDL: 87.14
TRIGLYCERIDES: 81 mg/dL (ref 0.0–149.0)
VLDL: 16.2 mg/dL (ref 0.0–40.0)

## 2016-09-14 LAB — VITAMIN D 25 HYDROXY (VIT D DEFICIENCY, FRACTURES): VITD: 32.26 ng/mL (ref 30.00–100.00)

## 2016-09-14 LAB — BASIC METABOLIC PANEL
BUN: 11 mg/dL (ref 6–23)
CHLORIDE: 104 meq/L (ref 96–112)
CO2: 29 mEq/L (ref 19–32)
Calcium: 9.6 mg/dL (ref 8.4–10.5)
Creatinine, Ser: 0.85 mg/dL (ref 0.40–1.20)
GFR: 84.01 mL/min (ref >60.00)
Glucose, Bld: 91 mg/dL (ref 70–99)
POTASSIUM: 4.4 meq/L (ref 3.5–5.1)
SODIUM: 139 meq/L (ref 135–145)

## 2016-09-14 LAB — TSH: TSH: 1.97 u[IU]/mL (ref 0.35–4.50)

## 2016-09-14 NOTE — Progress Notes (Signed)
   Subjective:    Patient ID: Monica Diaz, female    DOB: [birth year], 76 y.o.   MRN: [id number]  HPI CPE- Due for colonoscopy later this year.  UTD on mammo.  UTD on immunizations.  Due for DEXA.  Hyperlipidemia- chronic problem, on Crestor 10mg  daily.  Denies abd pain, N/V, myalgias.  Hypothyroid- chronic problem, on Armour Thyroid 30mg  daily.  Denies fatigue, changes to skin/hair/nails, constipation/diarrhea.  Some swelling.  Vit D deficiency/Osteoporosis- due for DEXA and Vit D levels.   Review of Systems Patient reports no vision/ hearing changes, adenopathy,fever, weight change,  persistant/recurrent hoarseness , swallowing issues, chest pain, palpitations, edema, hemoptysis, dyspnea (rest/exertional/paroxysmal nocturnal), gastrointestinal bleeding (melena, rectal bleeding), abdominal pain, significant heartburn, bowel changes, GU symptoms (dysuria, hematuria, incontinence), Gyn symptoms (abnormal  bleeding, pain),  syncope, focal weakness, memory loss, numbness & tingling, skin/hair/nail changes, abnormal bruising or bleeding, anxiety, or depression.   + foot pain w/ heels- pain is on the plantar surface, notes intermittent swelling + chronic cough    Objective:   Physical Exam General Appearance:    Alert, cooperative, no distress, appears stated age  Head:    Normocephalic, without obvious abnormality, atraumatic  Eyes:    PERRL, conjunctiva/corneas clear, EOM's intact, fundi    benign, both eyes  Ears:    Normal TM's and external ear canals, both ears  Nose:   Nares normal, septum midline, mucosa normal, no drainage    or sinus tenderness  Throat:   Lips, mucosa, and tongue normal; teeth and gums normal  Neck:   Supple, symmetrical, trachea midline, no adenopathy;    Thyroid: no enlargement/tenderness/nodules  Back:     Symmetric, no curvature, ROM normal, no CVA tenderness  Lungs:     Clear to auscultation bilaterally, respirations unlabored  Chest Wall:    No  tenderness or deformity   Heart:    Regular rate and rhythm, S1 and S2 normal, no murmur, rub   or gallop  Breast Exam:    Deferred to mammo  Abdomen:     Soft, non-tender, bowel sounds active all four quadrants,    no masses, no organomegaly  Genitalia:    Deferred  Rectal:    Extremities:   Extremities normal, atraumatic, no cyanosis or edema  Pulses:   2+ and symmetric all extremities  Skin:   Skin color, texture, turgor normal, no rashes or lesions  Lymph nodes:   Cervical, supraclavicular, and axillary nodes normal  Neurologic:   CNII-XII intact, normal strength, sensation and reflexes    throughout          Assessment & Plan:

## 2016-09-14 NOTE — Progress Notes (Signed)
Pre visit review using our clinic review tool, if applicable. No additional management support is needed unless otherwise documented below in the visit note. 

## 2016-09-14 NOTE — Assessment & Plan Note (Signed)
Chronic problem.  Currently doing well on Armour Thyroid.  Asymptomatic at this time w/ exception of mild LE edema.  Check labs.  Adjust meds prn

## 2016-09-14 NOTE — Assessment & Plan Note (Signed)
Chronic problem.  Due for DEXA and Vit D level.  Replete Vit D PRN.  Will follow.

## 2016-09-14 NOTE — Assessment & Plan Note (Signed)
Chronic problem.  Tolerating statin w/o difficulty.  Stressed need for healthy diet and regular exercise.  Check labs.  Adjust meds prn  

## 2016-09-14 NOTE — Assessment & Plan Note (Signed)
Hx of this.  Check labs.  Replete prn. 

## 2016-09-14 NOTE — Patient Instructions (Addendum)
Follow up in 6 months to recheck cholesterol and thyroid We'll notify you of your lab results and make any changes if needed Please go to the [facility] to get your chest xray Keep up the good work on [employer] and regular exercise- you look great! We'll call you with your podiatry appt for the foot pain Call with any questions or concerns Have a wonderful trip!!!  Call [facility] to schedule Bone Scan at [phone]  Bring a copy of your advance directives to your next office visit.  Continue to eat heart healthy diet (full of fruits, vegetables, whole grains, lean protein, water--limit salt, fat, and sugar intake) and increase physical activity as tolerated.  Continue doing brain stimulating activities (puzzles, reading, adult coloring books, staying active) to keep memory sharp.     Health Maintenance, Female Adopting a healthy lifestyle and getting preventive care can go a long way to promote health and wellness. Talk with your health care provider about what schedule of regular examinations is right for you. This is a good chance for you to check in with your provider about disease prevention and staying healthy. In between checkups, there are plenty of things you can do on your own. Experts have done a lot of research about which lifestyle changes and preventive measures are most likely to keep you healthy. Ask your health care provider for more information. Weight and diet Eat a healthy diet  Be sure to include plenty of vegetables, fruits, low-fat dairy products, and lean protein.  Do not eat a lot of foods high in solid fats, added sugars, or salt.  Get regular exercise. This is one of the most important things you can do for your health.  Most adults should exercise for at least 150 minutes each week. The exercise should increase your heart rate and make you sweat (moderate-intensity exercise).  Most adults should also do strengthening  exercises at least twice a week. This is in addition to the moderate-intensity exercise. Maintain a healthy weight  Body mass index (BMI) is a measurement that can be used to identify possible weight problems. It estimates body fat based on height and weight. Your health care provider can help determine your BMI and help you achieve or maintain a healthy weight.  For females 21 years of age and older:  A BMI below 18.5 is considered underweight.  A BMI of 18.5 to 24.9 is normal.  A BMI of 25 to 29.9 is considered overweight.  A BMI of 30 and above is considered obese. Watch levels of cholesterol and blood lipids  You should start having your blood tested for lipids and cholesterol at 74 years of age, then have this test every 5 years.  You may need to have your cholesterol levels checked more often if:  Your lipid or cholesterol levels are high.  You are older than 74 years of age.  You are at high risk for heart disease. Cancer screening Lung Cancer  Lung cancer screening is recommended for adults 62-29 years old who are at high risk for lung cancer because of a history of smoking.  A yearly low-dose CT scan of the lungs is recommended for people who:  Currently smoke.  Have quit within the past 15 years.  Have at least a 30-pack-year history of smoking. A pack year is smoking an average of one pack of cigarettes a day for 1 year.  Yearly screening should continue until it has been 15 years  since you quit.  Yearly screening should stop if you develop a health problem that would prevent you from having lung cancer treatment. Breast Cancer  Practice breast self-awareness. This means understanding how your breasts normally appear and feel.  It also means doing regular breast self-exams. Let your health care provider know about any changes, no matter how small.  If you are in your 20s or 30s, you should have a clinical breast exam (CBE) by a health care provider every 1-3  years as part of a regular health exam.  If you are 25 or older, have a CBE every year. Also consider having a breast X-ray (mammogram) every year.  If you have a family history of breast cancer, talk to your health care provider about genetic screening.  If you are at high risk for breast cancer, talk to your health care provider about having an MRI and a mammogram every year.  Breast cancer gene (BRCA) assessment is recommended for women who have family members with BRCA-related cancers. BRCA-related cancers include:  Breast.  Ovarian.  Tubal.  Peritoneal cancers.  Results of the assessment will determine the need for genetic counseling and BRCA1 and BRCA2 testing. Cervical Cancer  Your health care provider may recommend that you be screened regularly for cancer of the pelvic organs (ovaries, uterus, and vagina). This screening involves a pelvic examination, including checking for microscopic changes to the surface of your cervix (Pap test). You may be encouraged to have this screening done every 3 years, beginning at age 5.  For women ages 62-65, health care providers may recommend pelvic exams and Pap testing every 3 years, or they may recommend the Pap and pelvic exam, combined with testing for human papilloma virus (HPV), every 5 years. Some types of HPV increase your risk of cervical cancer. Testing for HPV may also be done on women of any age with unclear Pap test results.  Other health care providers may not recommend any screening for nonpregnant women who are considered low risk for pelvic cancer and who do not have symptoms. Ask your health care provider if a screening pelvic exam is right for you.  If you have had past treatment for cervical cancer or a condition that could lead to cancer, you need Pap tests and screening for cancer for at least 20 years after your treatment. If Pap tests have been discontinued, your risk factors (such as having a new sexual partner) need to  be reassessed to determine if screening should resume. Some women have medical problems that increase the chance of getting cervical cancer. In these cases, your health care provider may recommend more frequent screening and Pap tests. Colorectal Cancer  This type of cancer can be detected and often prevented.  Routine colorectal cancer screening usually begins at 74 years of age and continues through 74 years of age.  Your health care provider may recommend screening at an earlier age if you have risk factors for colon cancer.  Your health care provider may also recommend using home test kits to check for hidden blood in the stool.  A small camera at the end of a tube can be used to examine your colon directly (sigmoidoscopy or colonoscopy). This is done to check for the earliest forms of colorectal cancer.  Routine screening usually begins at age 57.  Direct examination of the colon should be repeated every 5-10 years through 74 years of age. However, you may need to be screened more often if early forms  of precancerous polyps or small growths are found. Skin Cancer  Check your skin from head to toe regularly.  Tell your health care provider about any new moles or changes in moles, especially if there is a change in a mole's shape or color.  Also tell your health care provider if you have a mole that is larger than the size of a pencil eraser.  Always use sunscreen. Apply sunscreen liberally and repeatedly throughout the day.  Protect yourself by wearing long sleeves, pants, a wide-brimmed hat, and sunglasses whenever you are outside. Heart disease, diabetes, and high blood pressure  High blood pressure causes heart disease and increases the risk of stroke. High blood pressure is more likely to develop in:  People who have blood pressure in the high end of the normal range (130-139/85-89 mm Hg).  People who are overweight or obese.  People who are African American.  If you are  51-4 years of age, have your blood pressure checked every 3-5 years. If you are 59 years of age or older, have your blood pressure checked every year. You should have your blood pressure measured twice-once when you are at a hospital or clinic, and once when you are not at a hospital or clinic. Record the average of the two measurements. To check your blood pressure when you are not at a hospital or clinic, you can use:  An automated blood pressure machine at a pharmacy.  A home blood pressure monitor.  If you are between 12 years and 62 years old, ask your health care provider if you should take aspirin to prevent strokes.  Have regular diabetes screenings. This involves taking a blood sample to check your fasting blood sugar level.  If you are at a normal weight and have a low risk for diabetes, have this test once every three years after 74 years of age.  If you are overweight and have a high risk for diabetes, consider being tested at a younger age or more often. Preventing infection Hepatitis B  If you have a higher risk for hepatitis B, you should be screened for this virus. You are considered at high risk for hepatitis B if:  You were born in a country where hepatitis B is common. Ask your health care provider which countries are considered high risk.  Your parents were born in a high-risk country, and you have not been immunized against hepatitis B (hepatitis B vaccine).  You have HIV or AIDS.  You use needles to inject street drugs.  You live with someone who has hepatitis B.  You have had sex with someone who has hepatitis B.  You get hemodialysis treatment.  You take certain medicines for conditions, including cancer, organ transplantation, and autoimmune conditions. Hepatitis C  Blood testing is recommended for:  Everyone born from [birth year] through 1965.  Anyone with known risk factors for hepatitis C. Sexually transmitted infections (STIs)  You should be screened  for sexually transmitted infections (STIs) including gonorrhea and chlamydia if:  You are sexually active and are younger than 74 years of age.  You are older than 74 years of age and your health care provider tells you that you are at risk for this type of infection.  Your sexual activity has changed since you were last screened and you are at an increased risk for chlamydia or gonorrhea. Ask your health care provider if you are at risk.  If you do not have HIV, but are at risk, it  may be recommended that you take a prescription medicine daily to prevent HIV infection. This is called pre-exposure prophylaxis (PrEP). You are considered at risk if:  You are sexually active and do not regularly use condoms or know the HIV status of your partner(s).  You take drugs by injection.  You are sexually active with a partner who has HIV. Talk with your health care provider about whether you are at high risk of being infected with HIV. If you choose to begin PrEP, you should first be tested for HIV. You should then be tested every 3 months for as long as you are taking PrEP. Pregnancy  If you are premenopausal and you may become pregnant, ask your health care provider about preconception counseling.  If you may become pregnant, take 400 to 800 micrograms (mcg) of folic acid every day.  If you want to prevent pregnancy, talk to your health care provider about birth control (contraception). Osteoporosis and menopause  Osteoporosis is a disease in which the bones lose minerals and strength with aging. This can result in serious bone fractures. Your risk for osteoporosis can be identified using a bone density scan.  If you are 53 years of age or older, or if you are at risk for osteoporosis and fractures, ask your health care provider if you should be screened.  Ask your health care provider whether you should take a calcium or vitamin D supplement to lower your risk for osteoporosis.  Menopause may  have certain physical symptoms and risks.  Hormone replacement therapy may reduce some of these symptoms and risks. Talk to your health care provider about whether hormone replacement therapy is right for you. Follow these instructions at home:  Schedule regular health, dental, and eye exams.  Stay current with your immunizations.  Do not use any tobacco products including cigarettes, chewing tobacco, or electronic cigarettes.  If you are pregnant, do not drink alcohol.  If you are breastfeeding, limit how much and how often you drink alcohol.  Limit alcohol intake to no more than 1 drink per day for nonpregnant women. One drink equals 12 ounces of beer, 5 ounces of wine, or 1 ounces of hard liquor.  Do not use street drugs.  Do not share needles.  Ask your health care provider for help if you need support or information about quitting drugs.  Tell your health care provider if you often feel depressed.  Tell your health care provider if you have ever been abused or do not feel safe at home. This information is not intended to replace advice given to you by your health care provider. Make sure you discuss any questions you have with your health care provider. Document Released: 10/25/2010 Document Revised: 09/17/2015 Document Reviewed: 01/13/2015 Elsevier Interactive Patient Education  2017 [employer].

## 2016-09-14 NOTE — Progress Notes (Signed)
Subjective:   Monica Diaz is a 75 y.o. female who presents for Medicare Annual (Subsequent) preventive examination.  Review of Systems:  No ROS.  Medicare Wellness Visit.  Cardiac Risk Factors include: advanced age (>81men, >18 women);dyslipidemia;family history of premature cardiovascular disease   Sleep patterns: Sleeps 6-7 hours, feels rested. Up to void x 1.  Home Safety/Smoke Alarms:  Smoke detectors and security in place.  Living environment; residence and Firearm Safety: Lives alone in 2 story home, rail in place. Feels safe home.  Seat Belt Safety/Bike Helmet: Wears seat belt.   Counseling:   Eye Exam-Last exam 2016, yearly Dr. Charlesetta Shanks exam 04/2016, every 6 months Dr. Lenore Cordia  Female:   Pap-03/25/2010       Mammo-06/02/2016, benign.       Dexa scan-04/21/2014, Osteopenia. Order placed on 06/16/2016 by Dr. Sarajane Jews       CCS-Colonoscopy 03/02/2007, normal. Recall 10 years. Due in October 2018 ([facility])     Objective:     Vitals: BP 130/83   Pulse 94   Temp 98 F (36.7 C) (Oral)   Resp 16   Ht 5\' 4"  (1.626 m)   Wt 158 lb 8 oz (71.9 kg)   SpO2 98%   BMI 27.21 kg/m   Body mass index is 27.21 kg/m.   Tobacco History  Smoking Status  . Never Smoker  Smokeless Tobacco  . Never Used     Counseling given: Yes   Past Medical History:  Diagnosis Date  . Arthritis   . Breast cancer ([place]) 2015  . Cancer ([place]) 2015   breast  . HOH (hard of hearing)   . Hx of radiation therapy 07/18/13-08/14/13   right breast total dose 50 Gy  . Hyperlipidemia   . Osteoporosis   . Personal history of radiation therapy 2015  . Seasonal allergies   . Sleep apnea    uses a cpap  . Thyroid disease   . Wears glasses    Past Surgical History:  Procedure Laterality Date  . ABDOMINAL HYSTERECTOMY     fibroids  . BREAST BIOPSY Right 2015   core   . BREAST EXCISIONAL BIOPSY Right 2005  . COLONOSCOPY    . LEFT OOPHORECTOMY    . PARTIAL MASTECTOMY WITH  NEEDLE LOCALIZATION AND AXILLARY SENTINEL LYMPH NODE BX Right 05/28/2013   Procedure: PARTIAL MASTECTOMY WITH NEEDLE LOCALIZATION AND AXILLARY SENTINEL LYMPH NODE BIOPSY;  Surgeon: Adin Hector, MD;  Location: [facility];  Service: General;  Laterality: Right;   Family History  Problem Relation Age of Onset  . Hypertension Mother   . Diabetes Maternal Aunt   . Stroke Unknown        grandfather   History  Sexual Activity  . Sexual activity: Not on file    Outpatient Encounter Prescriptions as of 09/14/2016  Medication Sig  . alendronate (FOSAMAX) 70 MG tablet TAKE 1 TABLET BY MOUTH EVERY WEEK WITH A FULL GLASS OF WATER ON AN EMPTY STOMACH  . anastrozole (ARIMIDEX) 1 MG tablet TAKE 1 TABLET(1 MG) BY MOUTH DAILY  . ARMOUR THYROID 30 MG tablet TAKE 1 TABLET BY MOUTH EVERY DAY  . calcium citrate-vitamin D (CITRACAL+D) 315-200 MG-UNIT per tablet Take 1 tablet by mouth 2 (two) times daily.  . Coenzyme Q10 (COQ10) 200 MG CAPS Take 1 capsule by mouth at bedtime.  . furosemide (LASIX) 20 MG tablet Take 1 tablet (20 mg total) by mouth daily.  Marland Kitchen loratadine (CLARITIN) 10 MG  tablet Take 10 mg by mouth daily as needed.   . mometasone (NASONEX) 50 MCG/ACT nasal spray Place 2 sprays into the nose daily as needed.  . Multiple Vitamins-Minerals (CENTRUM SILVER PO) Take 1 tablet by mouth.  . pyridoxine (B-6) 100 MG tablet Take 100 mg by mouth 2 (two) times daily.  . rosuvastatin (CRESTOR) 10 MG tablet TAKE 1 TABLET(10 MG) BY MOUTH DAILY  . vitamin B-12 (CYANOCOBALAMIN) 50 MCG tablet Take 50 mcg by mouth.  . vitamin C (ASCORBIC ACID) 500 MG tablet Take 500 mg by mouth 2 (two) times daily.  . [DISCONTINUED] Magnesium 500 MG CAPS Take by mouth.  . [DISCONTINUED] omeprazole (PRILOSEC) 40 MG capsule Reported on 08/27/2015   No facility-administered encounter medications on file as of 09/14/2016.     Activities of Daily Living In your present state of health, do you have any difficulty  performing the following activities: 09/14/2016 09/14/2016  Hearing? N -  Vision? N -  Difficulty concentrating or making decisions? N -  Walking or climbing stairs? N -  Dressing or bathing? N -  Doing errands, shopping? N -  Preparing Food and eating ? N N  Using the Toilet? N N  In the past six months, have you accidently leaked urine? N N  Do you have problems with loss of bowel control? N N  Managing your Medications? N N  Managing your Finances? N N  Housekeeping or managing your Housekeeping? N N  Some recent data might be hidden    Patient Care Team: Midge Minium, MD as PCP - General Dohmeier, Asencion Partridge, MD as Consulting Physician (Neurology) Juanita Craver, MD as Consulting Physician (Gastroenterology) Magrinat, Virgie Dad, MD as Consulting Physician (Oncology) Roseanne Kaufman, MD as Consulting Physician (Orthopedic Surgery)    Assessment:    Physical assessment deferred to PCP.  Exercise Activities and Dietary recommendations Current Exercise Habits: Structured exercise class, Type of exercise: walking, Time (Minutes): 40, Frequency (Times/Week): 3, Weekly Exercise (Minutes/Week): 120, Exercise limited by: None identified   Diet (meal preparation, eat out, water intake, caffeinated beverages, dairy products, fruits and vegetables):  Drinks water and POM juice   Breakfast: oatmeal, salmon, eggs, grits, cereal Lunch: skip or sandwich Dinner: starch, vegetables, lean protein  Encouraged to continue heart healthy diet and exercise.   Goals    . Weight (lb) < 148 lb (67.1 kg)          Lose 10 pounds by being consistent with work outs and watching food intake.       Fall Risk Fall Risk  09/14/2016 09/14/2016 08/27/2015 02/25/2015 07/03/2013  Falls in the past year? No No No No No  Number falls in past yr: - - - - -  Injury with Fall? - - - - -   Depression Screen PHQ 2/9 Scores 09/14/2016 09/14/2016 08/15/2016 08/27/2015  PHQ - 2 Score 0 0 0 0  PHQ- 9 Score - 0 0 -      Cognitive Function       Ad8 score reviewed for issues:  Issues making decisions: no  Less interest in hobbies / activities: no  Repeats questions, stories (family complaining): no  Trouble using ordinary gadgets (microwave, computer, phone): no  Forgets the month or year: no  Mismanaging finances: no  Remembering appts:no  Daily problems with thinking and/or memory: no Ad8 score is=0     Immunization History  Administered Date(s) Administered  . Pneumococcal Conjugate-13 01/02/2014  . Pneumococcal Polysaccharide-23 02/27/2015  . Td  03/18/2014   Screening Tests Health Maintenance  Topic Date Due  . INFLUENZA VACCINE  01/23/2017 (Originally 11/23/2016)  . COLONOSCOPY  01/23/2017 (Originally 03/01/2012)  . MAMMOGRAM  06/02/2017  . TETANUS/TDAP  03/18/2024  . DEXA SCAN  Completed  . PNA vac Low Risk Adult  Completed      Plan:    Call [facility] to schedule Bone Scan at [phone]  Bring a copy of your advance directives to your next office visit.  Continue to eat heart healthy diet (full of fruits, vegetables, whole grains, lean protein, water--limit salt, fat, and sugar intake) and increase physical activity as tolerated.  Continue doing brain stimulating activities (puzzles, reading, adult coloring books, staying active) to keep memory sharp.    I have personally reviewed and noted the following in the patient's chart:   . Medical and social history . Use of alcohol, tobacco or illicit drugs  . Current medications and supplements . Functional ability and status . Nutritional status . Physical activity . Advanced directives . List of other physicians . Hospitalizations, surgeries, and ER visits in previous 12 months . Vitals . Screenings to include cognitive, depression, and falls . Referrals and appointments  In addition, I have reviewed and discussed with patient certain preventive protocols, quality metrics, and best practice  recommendations. A written personalized care plan for preventive services as well as general preventive health recommendations were provided to patient.     Gerilyn Nestle, RN  09/14/2016

## 2016-09-14 NOTE — Progress Notes (Signed)
Patient ID: Monica Diaz, female   DOB: Sep 02, 1940, 76 y.o.   MRN: [id number]   Reviewed and agree w/ RN documentation.

## 2016-09-14 NOTE — Assessment & Plan Note (Signed)
Pt's PE WNL.  UTD on mammo.  Due for repeat DEXA.  Colonoscopy due later this year.  UTD on immunizations.  Check labs.  Anticipatory guidance provided.

## 2016-09-14 NOTE — Assessment & Plan Note (Signed)
Ongoing issue.  I suspect this is reflux related.  Due to chronicity, pt will get CXR to assess.  Reviewed lifestyle and dietary modifications.  Pt to try OTC PPI for sxs relief.  Will continue to follow.

## 2016-09-28 ENCOUNTER — Other Ambulatory Visit: Payer: Self-pay | Admitting: Family Medicine

## 2016-10-06 ENCOUNTER — Ambulatory Visit (INDEPENDENT_AMBULATORY_CARE_PROVIDER_SITE_OTHER): Payer: Medicare Other

## 2016-10-06 ENCOUNTER — Encounter: Payer: Self-pay | Admitting: Podiatry

## 2016-10-06 ENCOUNTER — Ambulatory Visit (INDEPENDENT_AMBULATORY_CARE_PROVIDER_SITE_OTHER): Payer: Medicare Other | Admitting: Podiatry

## 2016-10-06 ENCOUNTER — Other Ambulatory Visit: Payer: Self-pay | Admitting: Podiatry

## 2016-10-06 DIAGNOSIS — M79671 Pain in right foot: Secondary | ICD-10-CM

## 2016-10-06 DIAGNOSIS — M7750 Other enthesopathy of unspecified foot: Secondary | ICD-10-CM

## 2016-10-06 DIAGNOSIS — M779 Enthesopathy, unspecified: Principal | ICD-10-CM

## 2016-10-06 DIAGNOSIS — M79672 Pain in left foot: Principal | ICD-10-CM

## 2016-10-06 DIAGNOSIS — M778 Other enthesopathies, not elsewhere classified: Secondary | ICD-10-CM

## 2016-10-06 MED ORDER — METHYLPREDNISOLONE 4 MG PO TBPK: ORAL_TABLET | ORAL | 0 refills | Status: DC

## 2016-10-06 NOTE — Progress Notes (Signed)
   Subjective:    Patient ID: Monica Diaz, female    DOB: [birth year], 76 y.o.   MRN: [id number]  HPI: She presents today with chief complaint of pain to the forefoot bilateral. She states has been aching out for a couple of months and feels like she is walking on rocks. She states that about a trouble wearing high heels on starting to develop swelling and nothing seems to really help with that. I have seen my primary doctor for the swelling.  Review of Systems  Constitutional: Positive for unexpected weight change.  Gastrointestinal: Positive for constipation.  Musculoskeletal: Positive for arthralgias, back pain and myalgias.  All other systems reviewed and are negative.      Objective:   Physical Exam: Vital signs are stable she is alert and oriented 3 pulses are palpable. Neurologic sensorium appears to be intact deep tendon reflexes are brisk and equal bilateral muscle strength is normal symmetrical bilateral. She has tenderness on range of motion of the metatarsophalangeal joints the feet appear to be edematous she does have grade 1 pitting edema to the anterior ankles grade 2 to the dorsal aspect of the foot. Radiographs do not demonstrate any type of osseus abnormalities        Assessment & Plan:  Edema bilateral foot resulting in some capsulitis and neuritis of the forefoot. I do not feel that this is pathologic I feel that the edema is more than likely cardiogenic or nephrogenic or possibly even from venous insufficiency. I recommended that she follow-up with her primary doctor once again. If no results then we will consider venous insufficiency and have studies done.

## 2016-11-06 ENCOUNTER — Other Ambulatory Visit: Payer: Self-pay | Admitting: Family Medicine

## 2016-12-07 ENCOUNTER — Other Ambulatory Visit: Payer: Self-pay | Admitting: Oncology

## 2016-12-19 LAB — IFOBT (OCCULT BLOOD): IFOBT: NEGATIVE

## 2016-12-28 ENCOUNTER — Telehealth: Payer: Self-pay | Admitting: Podiatry

## 2016-12-28 NOTE — Telephone Encounter (Signed)
I saw Dr. Milinda Pointer back in June. I was seen for swelling of my feet and test were done and he recommended I see a cardiologist. I have been in and out of town and was wondering who he would recommend I see.

## 2016-12-29 NOTE — Telephone Encounter (Signed)
Left message informing pt of Dr. Stephenie Acres recommendation to see her PCP, then possible referral to another physician if not to be followed by PCP.

## 2017-01-02 ENCOUNTER — Encounter: Payer: Self-pay | Admitting: Family Medicine

## 2017-01-02 ENCOUNTER — Ambulatory Visit (INDEPENDENT_AMBULATORY_CARE_PROVIDER_SITE_OTHER): Payer: Medicare Other | Admitting: Family Medicine

## 2017-01-02 VITALS — BP 128/80 | HR 75 | Resp 16 | Ht 64.0 in | Wt 158.0 lb

## 2017-01-02 DIAGNOSIS — R600 Localized edema: Secondary | ICD-10-CM | POA: Diagnosis not present

## 2017-01-02 LAB — CBC WITH DIFFERENTIAL/PLATELET
BASOS ABS: 0 10*3/uL (ref 0.0–0.1)
BASOS PCT: 0.5 % (ref 0.0–3.0)
EOS ABS: 0.1 10*3/uL (ref 0.0–0.7)
Eosinophils Relative: 1.8 % (ref 0.0–5.0)
HEMATOCRIT: 35.3 % — AB (ref 36.0–46.0)
Hemoglobin: 11.3 g/dL — ABNORMAL LOW (ref 12.0–15.0)
LYMPHS PCT: 31.7 % (ref 12.0–46.0)
Lymphs Abs: 1.3 10*3/uL (ref 0.7–4.0)
MCHC: 32.1 g/dL (ref 30.0–36.0)
MCV: 91.6 fl (ref 78.0–100.0)
MONO ABS: 0.5 10*3/uL (ref 0.1–1.0)
Monocytes Relative: 12.1 % — ABNORMAL HIGH (ref 3.0–12.0)
NEUTROS ABS: 2.3 10*3/uL (ref 1.4–7.7)
Neutrophils Relative %: 53.9 % (ref 43.0–77.0)
Platelets: 287 10*3/uL (ref 150.0–400.0)
RBC: 3.85 Mil/uL — ABNORMAL LOW (ref 3.87–5.11)
RDW: 16 % — AB (ref 11.5–15.5)
WBC: 4.3 10*3/uL (ref 4.0–10.5)

## 2017-01-02 LAB — BASIC METABOLIC PANEL
BUN: 7 mg/dL (ref 6–23)
CALCIUM: 9.3 mg/dL (ref 8.4–10.5)
CO2: 25 mEq/L (ref 19–32)
CREATININE: 0.82 mg/dL (ref 0.40–1.20)
Chloride: 106 mEq/L (ref 96–112)
GFR: 87.49 mL/min (ref >60.00)
GLUCOSE: 93 mg/dL (ref 70–99)
Potassium: 4.1 mEq/L (ref 3.5–5.1)
SODIUM: 139 meq/L (ref 135–145)

## 2017-01-02 NOTE — Progress Notes (Signed)
Pre visit review using our clinic review tool, if applicable. No additional management support is needed unless otherwise documented below in the visit note. 

## 2017-01-02 NOTE — Patient Instructions (Signed)
Follow up as scheduled in November We'll notify you of your lab results and make any changes if needed We'll call you with your ECHO appt (ultrasound of the heart) and notify you of the results once available Continue to limit your salt intake, drink plenty of fluids, and elevate your legs as able Call with any questions or concerns Hang in there!!!

## 2017-01-02 NOTE — Progress Notes (Signed)
   Subjective:    Patient ID: Monica Diaz, female    DOB: [birth year], 76 y.o.   MRN: [id number]  HPI Edema- pt reports that if she wears any type of heel she develops 'excrutiating pain' in the balls of her feet.  She has seen podiatry who recommended customized orthotics- 'but they were $900'.  Pt reports podiatry recommended seeing a cardiologist for bilateral LE edema.  No swelling on exam today.  Pt will take Lasix PRN.  Denies SOB, CP.     Review of Systems For ROS see HPI     Objective:   Physical Exam  Constitutional: She is oriented to person, place, and time. She appears well-developed and well-nourished. No distress.  HENT:  Head: Normocephalic and atraumatic.  Eyes: Pupils are equal, round, and reactive to light. Conjunctivae and EOM are normal.  Neck: Normal range of motion. Neck supple. No thyromegaly present.  Cardiovascular: Normal rate, regular rhythm, normal heart sounds and intact distal pulses.   No murmur heard. Pulmonary/Chest: Effort normal and breath sounds normal. No respiratory distress.  Abdominal: Soft. She exhibits no distension. There is no tenderness.  Musculoskeletal: She exhibits no edema.  Lymphadenopathy:    She has no cervical adenopathy.  Neurological: She is alert and oriented to person, place, and time.  Skin: Skin is warm and dry.  Psychiatric: She has a normal mood and affect. Her behavior is normal.  Vitals reviewed.         Assessment & Plan:  Leg swelling- none noted on exam today but this continues to be an area of concern for pt, particularly after podiatry mentioned seeing cardiology.  Pt reports that legs will now only swell at the end of the day or if she is wearing particular shoes.  Discussed age related venous insufficiency- which is the most likely dx- but will get BNP and ECHO to assess for any cardiac issues.  She denies SOB, CP- which is encouraging.  Reviewed supportive care and red flags that should prompt return.  Pt  expressed understanding and is in agreement w/ plan.

## 2017-01-03 ENCOUNTER — Other Ambulatory Visit: Payer: Self-pay | Admitting: Family Medicine

## 2017-01-03 DIAGNOSIS — Z1211 Encounter for screening for malignant neoplasm of colon: Secondary | ICD-10-CM

## 2017-01-03 LAB — BRAIN NATRIURETIC PEPTIDE: PRO B NATRI PEPTIDE: 35 pg/mL (ref 0.0–100.0)

## 2017-01-03 LAB — TSH: TSH: 3.51 u[IU]/mL (ref 0.35–4.50)

## 2017-01-11 ENCOUNTER — Other Ambulatory Visit: Payer: Self-pay

## 2017-01-11 ENCOUNTER — Ambulatory Visit (HOSPITAL_COMMUNITY): Payer: Medicare Other | Attending: Cardiovascular Disease

## 2017-01-11 DIAGNOSIS — R600 Localized edema: Secondary | ICD-10-CM | POA: Insufficient documentation

## 2017-01-11 LAB — ECHOCARDIOGRAM COMPLETE
AVLVOTPG: 3 mmHg
Ao-asc: 38 cm
CHL CUP DOP CALC LVOT VTI: 18.4 cm
CHL CUP MV DEC (S): 250
CHL CUP TV REG PEAK VELOCITY: 241 cm/s
EERAT: 7.88
EWDT: 250 ms
FS: 40 % (ref 28–44)
IVS/LV PW RATIO, ED: 1.11
LA ID, A-P, ES: 31 mm
LA vol A4C: 30 ml
LA vol index: 20.9 mL/m2
LA vol: 37 mL
LADIAMINDEX: 1.75 cm/m2
LEFT ATRIUM END SYS DIAM: 31 mm
LV E/e' medial: 7.88
LV PW d: 8.77 mm — AB (ref 0.6–1.1)
LV SIMPSON'S DISK: 70
LV TDI E'MEDIAL: 8.48
LV dias vol: 63 mL (ref 46–106)
LV e' LATERAL: 9.65 cm/s
LV sys vol index: 11 mL/m2
LVDIAVOLIN: 36 mL/m2
LVEEAVG: 7.88
LVOT SV: 52 mL
LVOT area: 2.84 cm2
LVOT diameter: 19 mm
LVOTPV: 88.7 cm/s
LVSYSVOL: 19 mL (ref 14–42)
Lateral S' vel: 13.3 cm/s
MV Peak grad: 2 mmHg
MVPKAVEL: 87.4 m/s
MVPKEVEL: 76 m/s
RV sys press: 26 mmHg
Stroke v: 44 ml
TDI e' lateral: 9.65
TR max vel: 241 cm/s

## 2017-01-12 ENCOUNTER — Other Ambulatory Visit: Payer: Self-pay | Admitting: Family Medicine

## 2017-01-20 ENCOUNTER — Encounter: Payer: Self-pay | Admitting: Family Medicine

## 2017-01-27 ENCOUNTER — Ambulatory Visit (INDEPENDENT_AMBULATORY_CARE_PROVIDER_SITE_OTHER): Payer: Medicare Other | Admitting: Physician Assistant

## 2017-01-27 ENCOUNTER — Other Ambulatory Visit: Payer: Self-pay | Admitting: Family Medicine

## 2017-01-27 ENCOUNTER — Encounter: Payer: Self-pay | Admitting: Physician Assistant

## 2017-01-27 VITALS — BP 120/78 | HR 70 | Temp 98.7°F | Resp 14 | Ht 64.0 in | Wt 158.0 lb

## 2017-01-27 DIAGNOSIS — F418 Other specified anxiety disorders: Secondary | ICD-10-CM | POA: Diagnosis not present

## 2017-01-27 MED ORDER — CLONAZEPAM 0.125 MG PO TBDP
ORAL_TABLET | ORAL | 0 refills | Status: DC
Start: 2017-01-27 — End: 2017-03-13

## 2017-01-27 NOTE — Progress Notes (Signed)
Pre visit review using our clinic review tool, if applicable. No additional management support is needed unless otherwise documented below in the visit note. 

## 2017-01-27 NOTE — Progress Notes (Signed)
Patient presents to clinic today to discuss options to help with stage fright. 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 Denies any generalized anxiety, depressed mood or anhedonia. Has never taken anything for anxiety.   Past Medical History:  Diagnosis Date  . Arthritis   . Breast cancer ([place]) 2015  . Cancer ([place]) 2015   breast  . HOH (hard of hearing)   . Hx of radiation therapy 07/18/13-08/14/13   right breast total dose 50 Gy  . Hyperlipidemia   . Osteoporosis   . Personal history of radiation therapy 2015  . Seasonal allergies   . Sleep apnea    uses a cpap  . Thyroid disease   . Wears glasses     Current Outpatient Prescriptions on File Prior to Visit  Medication Sig Dispense Refill  . alendronate (FOSAMAX) 70 MG tablet TAKE 1 TABLET BY MOUTH EVERY WEEK WITH A FULL GLASS OF WATER ON AN EMPTY STOMACH 12 tablet 0  . anastrozole (ARIMIDEX) 1 MG tablet TAKE 1 TABLET(1 MG) BY MOUTH DAILY 90 tablet 0  . ARMOUR THYROID 30 MG tablet TAKE 1 TABLET BY MOUTH EVERY DAY 30 tablet 3  . calcium citrate-vitamin D (CITRACAL+D) 315-200 MG-UNIT per tablet Take 1 tablet by mouth 2 (two) times daily.    . Coenzyme Q10 (COQ10) 200 MG CAPS Take 1 capsule by mouth at bedtime.    . furosemide (LASIX) 20 MG tablet Take 1 tablet (20 mg total) by mouth daily. 30 tablet 3  . loratadine (CLARITIN) 10 MG tablet Take 10 mg by mouth daily as needed.     . mometasone (NASONEX) 50 MCG/ACT nasal spray Place 2 sprays into the nose daily as needed. 17 g 6  . Multiple Vitamins-Minerals (CENTRUM SILVER PO) Take 1 tablet by mouth.    . pyridoxine (B-6) 100 MG tablet Take 100 mg by mouth 2 (two) times daily.    . rosuvastatin (CRESTOR) 10 MG tablet TAKE 1 TABLET(10 MG) BY MOUTH DAILY 30 tablet 6  . vitamin B-12 (CYANOCOBALAMIN) 50 MCG tablet Take 50 mcg by mouth.    . vitamin C  (ASCORBIC ACID) 500 MG tablet Take 500 mg by mouth 2 (two) times daily.     No current facility-administered medications on file prior to visit.     Allergies  Allergen Reactions  . Codeine     REACTION: sick to stomach    Family History  Problem Relation Age of Onset  . Hypertension Mother   . Diabetes Maternal Aunt   . Stroke Unknown        grandfather    Social History   Social History  . Marital status: Divorced    Spouse name: N/A  . Number of children: 2  . Years of education: N/A   Occupational History  .  Retired   Social History Main Topics  . Smoking status: Never Smoker  . Smokeless tobacco: Never Used  . Alcohol use Yes     Comment: occasionally  . Drug use: No  . Sexual activity: Not Asked   Other Topics Concern  . None   Social History Narrative   Right handed, Caffeine 2-3 cups average daily.  Divorced, 2 daughters.     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     Review of Systems - See HPI.  All other ROS are negative.  BP 120/78  Pulse 70   Temp 98.7 F (37.1 C) (Oral)   Resp 14   Ht 5\' 4"  (1.626 m)   Wt 158 lb (71.7 kg)   SpO2 98%   BMI 27.12 kg/m   Physical Exam  Constitutional: She is oriented to person, place, and time and well-developed, well-nourished, and in no distress.  HENT:  Head: Normocephalic and atraumatic.  Eyes: Conjunctivae are normal.  Cardiovascular: Normal rate, regular rhythm, normal heart sounds and intact distal pulses.   Pulmonary/Chest: Effort normal and breath sounds normal. No respiratory distress. She has no wheezes. She has no rales. She exhibits no tenderness.  Neurological: She is alert and oriented to person, place, and time.  Skin: Skin is warm and dry. No rash noted.  Psychiatric: Affect normal.  Vitals reviewed.  Recent Results (from the past 2160 hour(s))  Basic metabolic panel     Status: None   Collection Time: 01/02/17  4:24 PM  Result Value Ref Range   Sodium 139 135 - 145 mEq/L   Potassium 4.1  3.5 - 5.1 mEq/L   Chloride 106 96 - 112 mEq/L   CO2 25 19 - 32 mEq/L   Glucose, Bld 93 70 - 99 mg/dL   BUN 7 6 - 23 mg/dL   Creatinine, Ser 0.82 0.40 - 1.20 mg/dL   Calcium 9.3 8.4 - 10.5 mg/dL   GFR 87.49 >60.00 mL/min  TSH     Status: None   Collection Time: 01/02/17  4:24 PM  Result Value Ref Range   TSH 3.51 0.35 - 4.50 uIU/mL  CBC with Differential/Platelet     Status: Abnormal   Collection Time: 01/02/17  4:24 PM  Result Value Ref Range   WBC 4.3 4.0 - 10.5 K/uL   RBC 3.85 (L) 3.87 - 5.11 Mil/uL   Hemoglobin 11.3 (L) 12.0 - 15.0 g/dL   HCT 35.3 (L) 36.0 - 46.0 %   MCV 91.6 78.0 - 100.0 fl   MCHC 32.1 30.0 - 36.0 g/dL   RDW 16.0 (H) 11.5 - 15.5 %   Platelets 287.0 150.0 - 400.0 K/uL   Neutrophils Relative % 53.9 43.0 - 77.0 %   Lymphocytes Relative 31.7 12.0 - 46.0 %   Monocytes Relative 12.1 (H) 3.0 - 12.0 %   Eosinophils Relative 1.8 0.0 - 5.0 %   Basophils Relative 0.5 0.0 - 3.0 %   Neutro Abs 2.3 1.4 - 7.7 K/uL   Lymphs Abs 1.3 0.7 - 4.0 K/uL   Monocytes Absolute 0.5 0.1 - 1.0 K/uL   Eosinophils Absolute 0.1 0.0 - 0.7 K/uL   Basophils Absolute 0.0 0.0 - 0.1 K/uL  B Nat Peptide     Status: None   Collection Time: 01/02/17  4:24 PM  Result Value Ref Range   Pro B Natriuretic peptide (BNP) 35.0 0.0 - 100.0 pg/mL  ECHOCARDIOGRAM COMPLETE     Status: Abnormal   Collection Time: 01/11/17  1:47 PM  Result Value Ref Range   LV PW d 8.77 (A) 0.6 - 1.1 mm   FS 40 28 - 44 %   LA vol 37 mL   Ao-asc 38 cm   LA ID, A-P, ES 31 mm   IVS/LV PW RATIO, ED 1.11    Stroke v 44 ml   LVOT VTI 18.4 cm   Reg peak vel 241 cm/s   RV sys press 26 mmHg   LV e' LATERAL 9.65 cm/s   LV E/e' medial 7.88    LV E/e'average 7.88  LA diam index 1.75 cm/m2   LA vol A4C 30 ml   LVOT peak grad rest 3 mmHg   E decel time 250 msec   LVOT diameter 19 mm   LVOT area 2.84 cm2   LVOT peak vel 88.7 cm/s   LVOT SV 52.00 mL   Peak grad 2 mmHg   E/e' ratio 7.88    MV pk E vel 76 m/s   TR max  vel 241 cm/s   MV pk A vel 87.4 m/s   LV sys vol 19 14 - 42 mL   LV sys vol index 11.0 mL/m2   LV dias vol 63 46 - 106 mL   LV dias vol index 36.0 mL/m2   LA vol index 20.9 mL/m2   MV Dec 250    LA diam end sys 31.00 mm   Simpson's disk 70.00    TDI e' medial 8.48    TDI e' lateral 9.65    Lateral S' vel 13.30 cm/sec    Assessment/Plan: 1. Situational anxiety With public speaking only. Discussed options. Will start with very low dose of Klonopin 0.125 mg before speeches. She is to try medication at home with family this weekend to make sure she tolerates well without side effect before use in public setting. Can titrate dose accordingly to therapeutic response.    Leeanne Rio, PA-C

## 2017-01-27 NOTE — Patient Instructions (Signed)
I have sent in a prescription for a very low dose of clonazepam to be used before presentations/speech-giving.   I recommend you try the medication at home first to make sure it does not make you feel too groggy and that you tolerate it.   Please call us and let us know how it is working.

## 2017-01-29 ENCOUNTER — Other Ambulatory Visit: Payer: Self-pay | Admitting: Family Medicine

## 2017-02-24 LAB — HM COLONOSCOPY

## 2017-03-02 ENCOUNTER — Encounter: Payer: Self-pay | Admitting: General Practice

## 2017-03-06 ENCOUNTER — Other Ambulatory Visit: Payer: Self-pay | Admitting: Oncology

## 2017-03-13 ENCOUNTER — Other Ambulatory Visit: Payer: Self-pay

## 2017-03-13 ENCOUNTER — Encounter: Payer: Self-pay | Admitting: Family Medicine

## 2017-03-13 ENCOUNTER — Ambulatory Visit: Payer: Medicare Other | Admitting: Family Medicine

## 2017-03-13 VITALS — BP 121/82 | HR 80 | Temp 97.9°F | Resp 16 | Ht 64.0 in | Wt 160.4 lb

## 2017-03-13 DIAGNOSIS — G5793 Unspecified mononeuropathy of bilateral lower limbs: Secondary | ICD-10-CM | POA: Diagnosis not present

## 2017-03-13 DIAGNOSIS — Z23 Encounter for immunization: Secondary | ICD-10-CM | POA: Diagnosis not present

## 2017-03-13 DIAGNOSIS — E785 Hyperlipidemia, unspecified: Secondary | ICD-10-CM | POA: Diagnosis not present

## 2017-03-13 DIAGNOSIS — E039 Hypothyroidism, unspecified: Secondary | ICD-10-CM | POA: Diagnosis not present

## 2017-03-13 LAB — HEPATIC FUNCTION PANEL
ALBUMIN: 3.8 g/dL (ref 3.5–5.2)
ALT: 14 U/L (ref 0–35)
AST: 24 U/L (ref 0–37)
Alkaline Phosphatase: 42 U/L (ref 39–117)
Bilirubin, Direct: 0.1 mg/dL (ref 0.0–0.3)
TOTAL PROTEIN: 6.9 g/dL (ref 6.0–8.3)
Total Bilirubin: 0.4 mg/dL (ref 0.2–1.2)

## 2017-03-13 LAB — BASIC METABOLIC PANEL
BUN: 11 mg/dL (ref 6–23)
CALCIUM: 9.5 mg/dL (ref 8.4–10.5)
CHLORIDE: 105 meq/L (ref 96–112)
CO2: 26 meq/L (ref 19–32)
CREATININE: 0.94 mg/dL (ref 0.40–1.20)
GFR: 74.7 mL/min (ref >60.00)
GLUCOSE: 92 mg/dL (ref 70–99)
Potassium: 4.1 mEq/L (ref 3.5–5.1)
Sodium: 138 mEq/L (ref 135–145)

## 2017-03-13 LAB — LIPID PANEL
CHOLESTEROL: 142 mg/dL (ref 0–200)
HDL: 56.3 mg/dL (ref >39.00)
LDL Cholesterol: 71 mg/dL (ref 0–99)
NonHDL: 85.35
TRIGLYCERIDES: 74 mg/dL (ref 0.0–149.0)
Total CHOL/HDL Ratio: 3
VLDL: 14.8 mg/dL (ref 0.0–40.0)

## 2017-03-13 LAB — CBC WITH DIFFERENTIAL/PLATELET
BASOS ABS: 0 10*3/uL (ref 0.0–0.1)
BASOS PCT: 0.2 % (ref 0.0–3.0)
EOS ABS: 0.1 10*3/uL (ref 0.0–0.7)
Eosinophils Relative: 1.3 % (ref 0.0–5.0)
HEMATOCRIT: 35.4 % — AB (ref 36.0–46.0)
Hemoglobin: 11.6 g/dL — ABNORMAL LOW (ref 12.0–15.0)
LYMPHS PCT: 31.5 % (ref 12.0–46.0)
Lymphs Abs: 1.9 10*3/uL (ref 0.7–4.0)
MCHC: 32.8 g/dL (ref 30.0–36.0)
MCV: 91.7 fl (ref 78.0–100.0)
MONO ABS: 0.7 10*3/uL (ref 0.1–1.0)
Monocytes Relative: 11.1 % (ref 3.0–12.0)
Neutro Abs: 3.3 10*3/uL (ref 1.4–7.7)
Neutrophils Relative %: 55.9 % (ref 43.0–77.0)
PLATELETS: 273 10*3/uL (ref 150.0–400.0)
RBC: 3.86 Mil/uL — ABNORMAL LOW (ref 3.87–5.11)
RDW: 15.6 % — AB (ref 11.5–15.5)
WBC: 6 10*3/uL (ref 4.0–10.5)

## 2017-03-13 LAB — TSH: TSH: 1.15 u[IU]/mL (ref 0.35–4.50)

## 2017-03-13 LAB — B12 AND FOLATE PANEL
FOLATE: 22.7 ng/mL (ref >5.9)
VITAMIN B 12: 381 pg/mL (ref 211–911)

## 2017-03-13 MED ORDER — GABAPENTIN 100 MG PO CAPS: 100.0000 mg | ORAL_CAPSULE | Freq: Three times a day (TID) | ORAL | 3 refills | Status: DC

## 2017-03-13 MED ORDER — CLONAZEPAM 0.125 MG PO TBDP: ORAL_TABLET | ORAL | 0 refills | Status: DC

## 2017-03-13 NOTE — Addendum Note (Signed)
Addended by: Desmond Dike L on: 03/13/2017 11:09 AM   Modules accepted: Orders

## 2017-03-13 NOTE — Assessment & Plan Note (Signed)
New to provider, ongoing for pt.  Has seen podiatry w/o improvement.  Check B12, B6, and folate.  Will start low dose gabapentin and monitor for improvement.  Pt expressed understanding and is in agreement w/ plan.

## 2017-03-13 NOTE — Assessment & Plan Note (Signed)
Chronic problem.  Tolerating statin w/o difficulty.  Check labs.  Adjust meds prn  

## 2017-03-13 NOTE — Patient Instructions (Addendum)
Schedule your complete physical in 6 months We'll notify you of your lab results and make any changes if needed Keep up the good work!  You look great! Start the Gabapentin nightly x3 days and then increase to twice daily for a week and then increase to 3x/day to help with the burning foot pain Use the Clonazepam as needed prior to public speaking Call with any questions or concerns Happy Thanksgiving!!!

## 2017-03-13 NOTE — Progress Notes (Signed)
   Subjective:    Patient ID: Monica Diaz, female    DOB: 20-Dec-1940, 76 y.o.   MRN: [id number]  HPI Hyperlipidemia- chronic problem, on Crestor 10mg  daily.  Denies abd pain, N/V, myalgias.  No CP, SOB, HAs, visual changes.  Hypothyroid- chronic problem, on Armour Thyroid 30mg  daily.  Denies excessive fatigue, changes to skin/hair/nails, constipation/diarrhea  Bilateral foot pain- pt reports pain under the balls of both feet and burning of toes.  Has seen podiatry.    Desires flu shot today  Review of Systems For ROS see HPI     Objective:   Physical Exam  Constitutional: She is oriented to person, place, and time. She appears well-developed and well-nourished. No distress.  HENT:  Head: Normocephalic and atraumatic.  Eyes: Conjunctivae and EOM are normal. Pupils are equal, round, and reactive to light.  Neck: Normal range of motion. Neck supple. No thyromegaly present.  Cardiovascular: Normal rate, regular rhythm, normal heart sounds and intact distal pulses.  No murmur heard. Pulmonary/Chest: Effort normal and breath sounds normal. No respiratory distress.  Abdominal: Soft. She exhibits no distension. There is no tenderness.  Musculoskeletal: She exhibits no edema.  Lymphadenopathy:    She has no cervical adenopathy.  Neurological: She is alert and oriented to person, place, and time.  Skin: Skin is warm and dry.  Psychiatric: She has a normal mood and affect. Her behavior is normal.  Vitals reviewed.         Assessment & Plan:

## 2017-03-13 NOTE — Assessment & Plan Note (Signed)
Chronic problem.  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  Check labs.  Adjust meds prn

## 2017-03-14 ENCOUNTER — Encounter: Payer: Self-pay | Admitting: General Practice

## 2017-03-17 LAB — VITAMIN B6: Vitamin B6: 19.2 ng/mL (ref 2.1–21.7)

## 2017-03-20 ENCOUNTER — Encounter: Payer: Self-pay | Admitting: General Practice

## 2017-04-26 ENCOUNTER — Other Ambulatory Visit: Payer: Self-pay | Admitting: Family Medicine

## 2017-04-28 ENCOUNTER — Other Ambulatory Visit: Payer: Self-pay | Admitting: *Deleted

## 2017-04-28 DIAGNOSIS — Z170 Estrogen receptor positive status [ER+]: Principal | ICD-10-CM

## 2017-04-28 DIAGNOSIS — C50411 Malignant neoplasm of upper-outer quadrant of right female breast: Secondary | ICD-10-CM

## 2017-06-05 ENCOUNTER — Other Ambulatory Visit: Payer: Self-pay | Admitting: Oncology

## 2017-06-05 ENCOUNTER — Ambulatory Visit
Admission: RE | Admit: 2017-06-05 | Discharge: 2017-06-05 | Disposition: A | Discharge status: Home / Self Care | Payer: Medicare Other | Source: Ambulatory Visit | Attending: Oncology | Admitting: Oncology

## 2017-06-05 DIAGNOSIS — Z170 Estrogen receptor positive status [ER+]: Principal | ICD-10-CM

## 2017-06-05 DIAGNOSIS — C50411 Malignant neoplasm of upper-outer quadrant of right female breast: Secondary | ICD-10-CM

## 2017-06-05 DIAGNOSIS — R599 Enlarged lymph nodes, unspecified: Secondary | ICD-10-CM

## 2017-06-05 DIAGNOSIS — N6320 Unspecified lump in the left breast, unspecified quadrant: Secondary | ICD-10-CM

## 2017-06-05 DIAGNOSIS — M8580 Other specified disorders of bone density and structure, unspecified site: Secondary | ICD-10-CM

## 2017-06-07 ENCOUNTER — Other Ambulatory Visit: Payer: Self-pay | Admitting: Oncology

## 2017-06-07 ENCOUNTER — Ambulatory Visit
Admission: RE | Admit: 2017-06-07 | Discharge: 2017-06-07 | Disposition: A | Discharge status: Home / Self Care | Payer: Medicare Other | Source: Ambulatory Visit | Attending: Oncology | Admitting: Oncology

## 2017-06-07 DIAGNOSIS — R599 Enlarged lymph nodes, unspecified: Secondary | ICD-10-CM

## 2017-06-07 HISTORY — PX: BREAST BIOPSY: SHX20

## 2017-06-14 NOTE — Progress Notes (Signed)
Monica Diaz  [phone] [phone]     ID: Monica Diaz OB: [birth year]  MR#: [id number]  VEL#:[id number]  PCP: Midge Minium, Diaz GYN:   SU: [name] OTHER Diaz: Jaquita Folds Dohmier, [name], [place] Gramig  CHIEF COMPLAINT: Estrogen receptor positive breast cancer  CURRENT TREATMENT: Anastrozole  BREAST CANCER HISTORY: From the original intake note:  Mayvis had bilateral screening mammography at the breast center 04/15/2013. This showed a possible mass in the right breast. Diagnostic right mammography and ultrasonography 05/09/2013 showed an irregular area of focal asymmetry in the right breast, which was not palpable. Ultrasound showed dystrophic calcification close to the area of asymmetry. The area of abnormality measured approximately 1.4 cm.  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 HER-2 was reported as negative during the multidisciplinary breast cancer conference 05/22/2013. The written report is pending.   On 05/20/2013 the patient underwent bilateral breast MRI which showed a 2.3 cm irregular enhancing mass in the right breast at the 12:00 position. There were no other areas of concern in either breast and there were no abnormal appearing lymph nodes. However incidentally a 6 mm enhancing right paramedian sternal lesion was noted.  The patient's subsequent history is as detailed below   INTERVAL HISTORY: Monica Diaz returns today for follow-up of her estrogen receptor positive breast cancer. She continues on anastrozole, with good tolerance. She denies issues with hot flashes or vaginal dryness.  Since her last visit, she underwent diagnostic bilateral mammography with CAD and tomography and  left axillary ultrasonography on 06/05/2017 at [place] showing: breast density category C. There was no evidence of malignancy in either breast. There was an indeterminate prominent left axillary lymph node. Biopsy of the lymph node ([id number]) on 06/07/2017 was benign.  She completed a bone density on 06/05/2017 which showed a T score of -2.0 osteopenia.   REVIEW OF SYSTEMS: [name] reports that she was a little sad and scared with the results of her last mammogram. She notes that she was thankful for the doctors following up on the enlarged lymph node. She notes that she had to make peace with her being a breast cancer survivor, but she always keeps the risk of recurrence in the back of her mind. She called her daughters to get encouragement. She reports an occasional cough and hoarseness that occurred 3 weeks ago. She denies being sick, but she notes that her cough produced clear mucus. She notes that she has occasional post nasal sinus drips. She notes that she is enjoying her job. She notes that she hasn't been able to work out 3 times per day, due to working. She denies unusual headaches, visual changes, nausea, vomiting, or dizziness. There has been no unusual cough, phlegm production, or pleurisy. This been no change in bowel or bladder habits. She denies unexplained fatigue or unexplained weight loss, bleeding, rash, or fever. A detailed review of systems was otherwise stable.   PAST MEDICAL HISTORY: Past Medical History:  Diagnosis Date  . Arthritis   . Breast cancer ([place]) 2015  . Cancer ([place]) 2015   breast  . HOH (hard of hearing)   . Hx of radiation therapy 07/18/13-08/14/13   right breast total dose 50 Gy  . Hyperlipidemia   . Osteoporosis   .  Personal history of radiation therapy 2015  . Seasonal allergies   . Sleep apnea    uses a cpap  . Thyroid disease   . Wears glasses     PAST SURGICAL HISTORY: Past Surgical History:  Procedure Laterality Date  . ABDOMINAL  HYSTERECTOMY     fibroids  . BREAST BIOPSY Right 2015   core   . BREAST EXCISIONAL BIOPSY Right 2005  . BREAST LUMPECTOMY Right   . COLONOSCOPY    . LEFT OOPHORECTOMY    . PARTIAL MASTECTOMY WITH NEEDLE LOCALIZATION AND AXILLARY SENTINEL LYMPH NODE BX Right 05/28/2013   Procedure: PARTIAL MASTECTOMY WITH NEEDLE LOCALIZATION AND AXILLARY SENTINEL LYMPH NODE BIOPSY;  Surgeon: Adin Hector, Diaz;  Location: [facility];  Service: General;  Laterality: Right;    FAMILY HISTORY Family History  Problem Relation Age of Onset  . Hypertension Mother   . Diabetes Maternal Aunt   . Stroke Unknown        grandfather  . Breast cancer Neg Hx   The patient knows little about her father. Her mother is living, 78 years old as of January 2015. The patient was an only child. There is no history of breast or ovarian cancer in the family  GYNECOLOGIC HISTORY:  Menarche age 32, first live birth age 70, the patient is [place] P2. 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 She is divorced and lives alone with [name], who are two nonverbal parakeets. Her daughter Monica Diaz is a housewife in [place]. Her daughter Monica Diaz is a college professor in [facility], teaching urban education. The patient's niece Monica Diaz is a neurologist working at [employer]: Not in place. The patient was given the appropriate documents at the time of her first visit here 05/22/2013 to complete and notarize at her discretion. She intends to name her daughter Monica Diaz as her healthcare power of attorney. She can be reached at [place]: Social History   Tobacco Use  . Smoking status: Never Smoker  . Smokeless tobacco: Never Used   Substance Use Topics  . Alcohol use: Yes    Comment: occasionally  . Drug use: No     Colonoscopy:  PAP:  Bone density:06/05/2017 showed a T score of -2.0 psteopenia  Lipid panel:  Allergies  Allergen Reactions  . Codeine     REACTION: sick to stomach    Current Outpatient Medications  Medication Sig Dispense Refill  . alendronate (FOSAMAX) 70 MG tablet TAKE 1 TABLET BY MOUTH EVERY WEEK WITH A FULL GLASS OF WATER ON AN EMPTY STOMACH 12 tablet 3  . anastrozole (ARIMIDEX) 1 MG tablet TAKE 1 TABLET(1 MG) BY MOUTH DAILY 90 tablet 0  . ARMOUR THYROID 30 MG tablet TAKE 1 TABLET BY MOUTH EVERY DAY 30 tablet 6  . calcium citrate-vitamin D (CITRACAL+D) 315-200 MG-UNIT per tablet Take 1 tablet by mouth 2 (two) times daily.    . clonazepam (KLONOPIN) 0.125 MG disintegrating tablet Take 1 tablet by mouth prior to speeches. 30 tablet 0  . Coenzyme Q10 (COQ10) 200 MG CAPS Take 1 capsule by mouth at bedtime.    . furosemide (LASIX) 20 MG tablet Take 1 tablet (20 mg total) by mouth daily. 30 tablet 3  . gabapentin (NEURONTIN) 100  MG capsule Take 1 capsule (100 mg total) 3 (three) times daily by mouth. 90 capsule 3  . loratadine (CLARITIN) 10 MG tablet Take 10 mg by mouth daily as needed.     . mometasone (NASONEX) 50 MCG/ACT nasal spray Place 2 sprays into the nose daily as needed. 17 g 6  . Multiple Vitamins-Minerals (CENTRUM SILVER PO) Take 1 tablet by mouth.    . pyridoxine (B-6) 100 MG tablet Take 100 mg by mouth 2 (two) times daily.    . rosuvastatin (CRESTOR) 10 MG tablet TAKE 1 TABLET(10 MG) BY MOUTH DAILY 30 tablet 6  . vitamin B-12 (CYANOCOBALAMIN) 50 MCG tablet Take 50 mcg by mouth.    . vitamin C (ASCORBIC ACID) 500 MG tablet Take 500 mg by mouth 2 (two) times daily.     No current facility-administered medications for this visit.     OBJECTIVE: Middle-aged [place] American woman who appears well Vitals:   06/15/17 1319  BP: (!) 140/93  Pulse: 80  Resp: 18  Temp: 98.4 F  (36.9 C)  SpO2: 100%     Body mass index is 27.29 kg/m.    ECOG FS:1 - Symptomatic but completely ambulatory  Sclerae unicteric, pupils round and equal Oropharynx clear and moist No cervical or supraclavicular adenopathy Lungs no rales or rhonchi Heart regular rate and rhythm Abd soft, nontender, positive bowel sounds MSK no focal spinal tenderness, no upper extremity lymphedema Neuro: nonfocal, well oriented, appropriate affect Breasts: The right breast is status post lumpectomy and radiation.  There is no evidence of local recurrence.  Both axillae are benign.  LAB RESULTS:  CMP     Component Value Date/Time   NA 138 06/15/2017 1303   NA 139 06/16/2016 1300   K 4.1 06/15/2017 1303   K 4.1 06/16/2016 1300   CL 105 06/15/2017 1303   CO2 26 06/15/2017 1303   CO2 24 06/16/2016 1300   GLUCOSE 88 06/15/2017 1303   GLUCOSE 92 06/16/2016 1300   BUN 10 06/15/2017 1303   BUN 8.7 06/16/2016 1300   CREATININE 0.95 06/15/2017 1303   CREATININE 0.8 06/16/2016 1300   CALCIUM 9.5 06/15/2017 1303   CALCIUM 9.4 06/16/2016 1300   PROT 7.7 06/15/2017 1303   PROT 7.6 06/16/2016 1300   ALBUMIN 3.7 06/15/2017 1303   ALBUMIN 3.6 06/16/2016 1300   AST 18 06/15/2017 1303   AST 18 06/16/2016 1300   ALT 12 06/15/2017 1303   ALT 12 06/16/2016 1300   ALKPHOS 44 06/15/2017 1303   ALKPHOS 55 06/16/2016 1300   BILITOT 0.3 06/15/2017 1303   BILITOT 0.36 06/16/2016 1300   GFRNONAA 57 (L) 06/15/2017 1303   GFRAA >60 06/15/2017 1303    I No results found for: SPEP  Lab Results  Component Value Date   WBC 5.3 06/15/2017   NEUTROABS 2.6 06/15/2017   HGB 11.4 (L) 06/15/2017   HCT 35.3 06/15/2017   MCV 91.7 06/15/2017   PLT 247 06/15/2017      Chemistry      Component Value Date/Time   NA 138 06/15/2017 1303   NA 139 06/16/2016 1300   K 4.1 06/15/2017 1303   K 4.1 06/16/2016 1300   CL 105 06/15/2017 1303   CO2 26 06/15/2017 1303   CO2 24 06/16/2016 1300   BUN 10 06/15/2017 1303    BUN 8.7 06/16/2016 1300   CREATININE 0.95 06/15/2017 1303   CREATININE 0.8 06/16/2016 1300      Component Value Date/Time  CALCIUM 9.5 06/15/2017 1303   CALCIUM 9.4 06/16/2016 1300   ALKPHOS 44 06/15/2017 1303   ALKPHOS 55 06/16/2016 1300   AST 18 06/15/2017 1303   AST 18 06/16/2016 1300   ALT 12 06/15/2017 1303   ALT 12 06/16/2016 1300   BILITOT 0.3 06/15/2017 1303   BILITOT 0.36 06/16/2016 1300       No results found for: LABCA2  No components found for: LABCA125  No results for input(s): INR in the last 168 hours.  Urinalysis No results found for: COLORURINE  STUDIES:Dg Bone Density  Result Date: 06/05/2017 EXAM: DUAL X-RAY ABSORPTIOMETRY (DXA) FOR BONE MINERAL DENSITY IMPRESSION: Referring Physician:  Chauncey Cruel PATIENT: Name: ISABEL, ARDILA Patient ID:  [id number] Birth Date: [birth year] Height:     63.3 in. Sex:         Female Measured:   06/05/2017 Weight:     160.6 lbs. Indications: Advanced Age, Anastrazole, Breast Cancer History, Estrogen Deficient, Hysterectomy, Postmenopausal Fractures: None Treatments: Calcium (E943.0), Fosamax, Vitamin D (E933.5) ASSESSMENT: The BMD measured at Femur Neck Right is 0.760 g/cm2 with a T-score of -2.0. This patient is considered OSTEOPENIC according to [facility]) criteria. There has been a statistically significant increase in BMD of Lumbar spine and a statistically significant decrease in BMD of left hip since prior exam dated 04/21/2014. Patient is not a candidate for FRAX assessment. Site Region Measured Date Measured Age YA T-score BMD Significant CHANGE DualFemur Neck Right 06/05/2017    [age]         -2.0    0.760 g/cm2 AP Spine L1-L4 06/05/2017 [age] 0.8 1.299 g/cm2 * World Health Organization [facility]) criteria for post-menopausal, Caucasian Women: Normal       T-score at or above -1 SD Osteopenia   T-score between -1 and -2.5 SD Osteoporosis T-score at or below -2.5 SD RECOMMENDATION: [place] recommends that FDA-approved medical therapies be considered in postmenopausal women and men age 22 or older with a: 1. Hip or vertebral (clinical or morphometric) fracture. 2. T-score of <-2.5 at the spine or hip. 3. Ten-year fracture probability by FRAX of 3% or greater for hip fracture or 20% or greater for major osteoporotic fracture. All treatment decisions require clinical judgment and consideration of individual patient factors, including patient preferences, co-morbidities, previous drug use, risk factors not captured in the FRAX model (e.g. falls, vitamin D deficiency, increased bone turnover, interval significant decline in bone density) and possible under - or over-estimation of fracture risk by FRAX. All patients should ensure an adequate intake of dietary calcium (1200 mg/d) and vitamin D (800 IU daily) unless contraindicated. FOLLOW-UP: People with diagnosed cases of osteoporosis or at high risk for fracture should have regular bone mineral density tests. For patients eligible for Medicare, routine testing is allowed once every 2 years. The testing frequency can be increased to one year for patients who have rapidly progressing disease, those who are receiving or discontinuing medical therapy to restore bone mass, or have additional risk factors. I have reviewed this report, and agree with the above findings. Mark A. Thornton Papas, M.D. [facility] Radiology Electronically Signed   By: Lavonia Dana M.D.   On: 06/05/2017 12:54   Mm Diag Breast Tomo Bilateral  Result Date: 06/05/2017 CLINICAL DATA:  77 year old female status post right lumpectomy with radiation therapy in 2015. No current symptoms. EXAM: 2D DIGITAL DIAGNOSTIC BILATERAL MAMMOGRAM WITH CAD AND ADJUNCT TOMO ULTRASOUND LEFT AXILLA COMPARISON:  Previous exam(s). ACR Breast Density Category  c: The breast tissue is heterogeneously dense, which may obscure small masses. FINDINGS: Stable post treatment changes are demonstrated within the  right breast. No new or suspicious mammographic findings are identified within either breast. However, note is made of a prominent left axillary lymph node. Further evaluation with ultrasound was performed. Mammographic images were processed with CAD. Targeted ultrasound is performed, showing a single prominent left axillary lymph node corresponding with the mammographic findings. It demonstrates diffuse cortical thickening up to 0.6 cm. IMPRESSION: 1. Indeterminate prominent left axillary lymph node for which ultrasound-guided biopsy is recommended. 2. Stable right breast postsurgical changes without mammographic evidence of malignancy in either breast. RECOMMENDATION: Ultrasound-guided biopsy of an indeterminate left axillary lymph node. I have discussed the findings and recommendations with the patient. Results were also provided in writing at the conclusion of the visit. If applicable, a reminder letter will be sent to the patient regarding the next appointment. BI-RADS CATEGORY  4: Suspicious. Electronically Signed   By: Kristopher Oppenheim M.D.   On: 06/05/2017 12:38   [place] Axillary Node Core Biopsy Left  Result Date: 06/07/2017 CLINICAL DATA:  History of right breast cancer status post lumpectomy in 2015. Enlarged LEFT axillary lymph node. EXAM: [place] AXILLARY NODE CORE BIOPSY LEFT COMPARISON:  Previous exam(s). FINDINGS: I met with the patient and we discussed the procedure of ultrasound-guided biopsy, including benefits and alternatives. We discussed the high likelihood of a successful procedure. We discussed the risks of the procedure, including infection, bleeding, tissue injury, clip migration, and inadequate sampling. Informed written consent was given. The usual time-out protocol was performed immediately prior to the procedure. Using sterile technique and 1% Lidocaine as local anesthetic, under direct ultrasound visualization, a 14 gauge spring-loaded device was used to perform biopsy of a left axillary  lymph node using a lateral to medial approach. At the conclusion of the procedure a spiral shaped HydroMARK tissue marker clip was deployed into the biopsy cavity. Follow up 2 view mammogram was performed and dictated separately. IMPRESSION: Ultrasound guided biopsy of a right axillary lymph node. No apparent complications. Electronically Signed   By: Lillia Mountain M.D.   On: 06/07/2017 14:35   [place] Axilla Left  Result Date: 06/05/2017 CLINICAL DATA:  77 year old female status post right lumpectomy with radiation therapy in 2015. No current symptoms. EXAM: 2D DIGITAL DIAGNOSTIC BILATERAL MAMMOGRAM WITH CAD AND ADJUNCT TOMO ULTRASOUND LEFT AXILLA COMPARISON:  Previous exam(s). ACR Breast Density Category c: The breast tissue is heterogeneously dense, which may obscure small masses. FINDINGS: Stable post treatment changes are demonstrated within the right breast. No new or suspicious mammographic findings are identified within either breast. However, note is made of a prominent left axillary lymph node. Further evaluation with ultrasound was performed. Mammographic images were processed with CAD. Targeted ultrasound is performed, showing a single prominent left axillary lymph node corresponding with the mammographic findings. It demonstrates diffuse cortical thickening up to 0.6 cm. IMPRESSION: 1. Indeterminate prominent left axillary lymph node for which ultrasound-guided biopsy is recommended. 2. Stable right breast postsurgical changes without mammographic evidence of malignancy in either breast. RECOMMENDATION: Ultrasound-guided biopsy of an indeterminate left axillary lymph node. I have discussed the findings and recommendations with the patient. Results were also provided in writing at the conclusion of the visit. If applicable, a reminder letter will be sent to the patient regarding the next appointment. BI-RADS CATEGORY  4: Suspicious. Electronically Signed   By: Kristopher Oppenheim M.D.   On: 06/05/2017 12:38    Mm  Clip Placement Left  Result Date: 06/07/2017 CLINICAL DATA:  History of right breast cancer status post lumpectomy in 2015. Recent diagnostic study showed an enlarged left axillary lymph node. Status post ultrasound-guided core biopsy of the left axillary lymph node. EXAM: DIAGNOSTIC LEFT MAMMOGRAM POST ULTRASOUND BIOPSY COMPARISON:  Previous exam(s). FINDINGS: Mammographic images were obtained following ultrasound guided biopsy of a left axillary lymph node. Mammographic images show there is a spiral shaped HydroMARK clip in appropriate position. IMPRESSION: Status post ultrasound-guided core biopsy of a left axillary lymph node with pathology pending. Final Assessment: Post Procedure Mammograms for Marker Placement Electronically Signed   By: Lillia Mountain M.D.   On: 06/07/2017 14:37    ASSESSMENT: 77 y.o. [place] woman status post right breast biopsy 05/14/2013 for a clinical T2 N0, stage IIA invasive ductal carcinoma, grade 2 or 3, estrogen receptor 99% positive, progesterone receptor 50% positive, with an MIB-1 of 91%, and no HER-2 amplification  (1) 6 mm enhancing right sternal lesion noted by breast MRI 05/20/2013-- negative on PET 06/04/2013  (2) status post right lumpectomy and sentinel lymph node sampling to 07/12/2013 for a pT2 pN0, stage IIA invasive ductal carcinoma, mucinous, grade 2, with repeat HER-2 again negative.  (a0 left axillary lymph node biopsy 06/07/2017 benign reactive lymph node  (3) Oncotype score of 18 predicts a 10 year risk of outside the breast recurrence of 12% if the patient's only systemic treatment is tamoxifen for 5 years. It also predicts a minimal benefit from chemotherapy.  (4) adjuvant radiation completed 08/14/2013  (5) anastrozole started 09/06/2013  (a) repeat bone density scan 04/13/2014 showed osteopenia with a T score of -1.4  (b) bone density on 06/05/2017 showed a T score of -2.0 osteopenia  PLAN: Briyana is now 4 years out from  definitive surgery for her breast cancer with no evidence of disease recurrence.  This is very favorable.  She is tolerating anastrozole remarkable well the plan is to continue that a minimum of 5 years  She does have worsening bone density.  She is already on Fosamax weekly.  She is on vitamin D supplementation.  I suggest that she increase her walking program.  Luckily the slightly enlarged lymph node came back reactive, no cancer.  She does not have any systemic symptoms specifically no fevers, rash, unexplained weight loss or unexplained fatigue.  She did have an upper respiratory infection not long ago.  She tells me she had a chest x-ray although I do not find that report.  She says that was well.  She has minimal anemia.  However I do not have any explanation for it.  I think it would be prudent to obtain some lab work regarding that and I have set that up for the first week in March.  Otherwise she will return to see me in 1 year.  She knows to call for any other problems that may develop before her next visit.   Yarethzi Branan, Virgie Dad, Diaz  06/15/17 2:04 PM Medical Oncology and Hematology [facility] [address] [place], [place] [address] Tel. [phone]    Fax. [phone]  This document serves as a record of services personally performed by Lurline Del, Diaz. It was created on his behalf by Sheron Nightingale, a trained medical scribe. The creation of this record is based on the scribe's personal observations and the provider's statements to them.   I have reviewed the above documentation for accuracy and completeness, and I agree with the above.

## 2017-06-15 ENCOUNTER — Inpatient Hospital Stay: Payer: Medicare Other

## 2017-06-15 ENCOUNTER — Inpatient Hospital Stay: Payer: Medicare Other | Attending: Oncology | Admitting: Oncology

## 2017-06-15 ENCOUNTER — Telehealth: Payer: Self-pay | Admitting: Oncology

## 2017-06-15 VITALS — BP 140/93 | HR 80 | Temp 98.4°F | Resp 18 | Ht 64.0 in | Wt 159.0 lb

## 2017-06-15 DIAGNOSIS — Z923 Personal history of irradiation: Secondary | ICD-10-CM | POA: Insufficient documentation

## 2017-06-15 DIAGNOSIS — M8580 Other specified disorders of bone density and structure, unspecified site: Secondary | ICD-10-CM

## 2017-06-15 DIAGNOSIS — E785 Hyperlipidemia, unspecified: Secondary | ICD-10-CM | POA: Insufficient documentation

## 2017-06-15 DIAGNOSIS — Z170 Estrogen receptor positive status [ER+]: Principal | ICD-10-CM

## 2017-06-15 DIAGNOSIS — D649 Anemia, unspecified: Secondary | ICD-10-CM | POA: Insufficient documentation

## 2017-06-15 DIAGNOSIS — C50411 Malignant neoplasm of upper-outer quadrant of right female breast: Secondary | ICD-10-CM | POA: Insufficient documentation

## 2017-06-15 DIAGNOSIS — G4730 Sleep apnea, unspecified: Secondary | ICD-10-CM | POA: Insufficient documentation

## 2017-06-15 DIAGNOSIS — Z79899 Other long term (current) drug therapy: Secondary | ICD-10-CM | POA: Diagnosis not present

## 2017-06-15 DIAGNOSIS — E079 Disorder of thyroid, unspecified: Secondary | ICD-10-CM | POA: Diagnosis not present

## 2017-06-15 DIAGNOSIS — D539 Nutritional anemia, unspecified: Secondary | ICD-10-CM

## 2017-06-15 DIAGNOSIS — D708 Other neutropenia: Secondary | ICD-10-CM

## 2017-06-15 DIAGNOSIS — Z79811 Long term (current) use of aromatase inhibitors: Secondary | ICD-10-CM | POA: Diagnosis not present

## 2017-06-15 LAB — CBC WITH DIFFERENTIAL/PLATELET
BASOS PCT: 0 %
Basophils Absolute: 0 10*3/uL (ref 0.0–0.1)
EOS ABS: 0.1 10*3/uL (ref 0.0–0.5)
EOS PCT: 1 %
HCT: 35.3 % (ref 34.8–46.6)
HEMOGLOBIN: 11.4 g/dL — AB (ref 11.6–15.9)
Lymphocytes Relative: 40 %
Lymphs Abs: 2.1 10*3/uL (ref 0.9–3.3)
MCH: 29.6 pg (ref 25.1–34.0)
MCHC: 32.3 g/dL (ref 31.5–36.0)
MCV: 91.7 fL (ref 79.5–101.0)
Monocytes Absolute: 0.5 10*3/uL (ref 0.1–0.9)
Monocytes Relative: 9 %
NEUTROS PCT: 50 %
Neutro Abs: 2.6 10*3/uL (ref 1.5–6.5)
PLATELETS: 247 10*3/uL (ref 145–400)
RBC: 3.85 MIL/uL (ref 3.70–5.45)
RDW: 15.4 % — AB (ref 11.2–14.5)
WBC: 5.3 10*3/uL (ref 3.9–10.3)

## 2017-06-15 LAB — COMPREHENSIVE METABOLIC PANEL
ALK PHOS: 44 U/L (ref 40–150)
ALT: 12 U/L (ref 0–55)
AST: 18 U/L (ref 5–34)
Albumin: 3.7 g/dL (ref 3.5–5.0)
Anion gap: 7 (ref 3–11)
BUN: 10 mg/dL (ref 7–26)
CALCIUM: 9.5 mg/dL (ref 8.4–10.4)
CO2: 26 mmol/L (ref 22–29)
CREATININE: 0.95 mg/dL (ref 0.60–1.10)
Chloride: 105 mmol/L (ref 98–109)
GFR, EST NON AFRICAN AMERICAN: 57 mL/min — AB (ref >60)
Glucose, Bld: 88 mg/dL (ref 70–140)
Potassium: 4.1 mmol/L (ref 3.5–5.1)
SODIUM: 138 mmol/L (ref 136–145)
Total Bilirubin: 0.3 mg/dL (ref 0.2–1.2)
Total Protein: 7.7 g/dL (ref 6.4–8.3)

## 2017-06-15 NOTE — Telephone Encounter (Signed)
Gave avs and calendar for march and February 2020

## 2017-06-15 NOTE — Addendum Note (Signed)
Addended by: Chauncey Cruel on: 06/15/2017 03:18 PM   Modules accepted: Orders

## 2017-06-29 ENCOUNTER — Other Ambulatory Visit: Payer: Medicare Other

## 2017-06-30 ENCOUNTER — Inpatient Hospital Stay: Payer: Medicare Other | Attending: Oncology

## 2017-06-30 DIAGNOSIS — C50411 Malignant neoplasm of upper-outer quadrant of right female breast: Secondary | ICD-10-CM

## 2017-06-30 DIAGNOSIS — M8580 Other specified disorders of bone density and structure, unspecified site: Secondary | ICD-10-CM | POA: Insufficient documentation

## 2017-06-30 DIAGNOSIS — Z923 Personal history of irradiation: Secondary | ICD-10-CM | POA: Insufficient documentation

## 2017-06-30 DIAGNOSIS — Z170 Estrogen receptor positive status [ER+]: Secondary | ICD-10-CM | POA: Diagnosis not present

## 2017-06-30 DIAGNOSIS — E079 Disorder of thyroid, unspecified: Secondary | ICD-10-CM | POA: Insufficient documentation

## 2017-06-30 DIAGNOSIS — G4730 Sleep apnea, unspecified: Secondary | ICD-10-CM | POA: Diagnosis not present

## 2017-06-30 DIAGNOSIS — Z79899 Other long term (current) drug therapy: Secondary | ICD-10-CM | POA: Insufficient documentation

## 2017-06-30 DIAGNOSIS — Z79811 Long term (current) use of aromatase inhibitors: Secondary | ICD-10-CM | POA: Diagnosis not present

## 2017-06-30 DIAGNOSIS — D708 Other neutropenia: Secondary | ICD-10-CM

## 2017-06-30 DIAGNOSIS — E785 Hyperlipidemia, unspecified: Secondary | ICD-10-CM | POA: Insufficient documentation

## 2017-06-30 DIAGNOSIS — D649 Anemia, unspecified: Secondary | ICD-10-CM | POA: Diagnosis not present

## 2017-06-30 LAB — CBC WITH DIFFERENTIAL/PLATELET
Basophils Absolute: 0 10*3/uL (ref 0.0–0.1)
Basophils Relative: 0 %
EOS ABS: 0.1 10*3/uL (ref 0.0–0.5)
EOS PCT: 1 %
HCT: 36.2 % (ref 34.8–46.6)
Hemoglobin: 11.7 g/dL (ref 11.6–15.9)
LYMPHS ABS: 1.6 10*3/uL (ref 0.9–3.3)
Lymphocytes Relative: 30 %
MCH: 29.5 pg (ref 25.1–34.0)
MCHC: 32.3 g/dL (ref 31.5–36.0)
MCV: 91.2 fL (ref 79.5–101.0)
Monocytes Absolute: 0.5 10*3/uL (ref 0.1–0.9)
Monocytes Relative: 9 %
Neutro Abs: 3.3 10*3/uL (ref 1.5–6.5)
Neutrophils Relative %: 60 %
PLATELETS: 241 10*3/uL (ref 145–400)
RBC: 3.97 MIL/uL (ref 3.70–5.45)
RDW: 15.5 % — ABNORMAL HIGH (ref 11.2–14.5)
WBC: 5.5 10*3/uL (ref 3.9–10.3)

## 2017-06-30 LAB — COMPREHENSIVE METABOLIC PANEL
ALT: 13 U/L (ref 0–55)
AST: 20 U/L (ref 5–34)
Albumin: 3.6 g/dL (ref 3.5–5.0)
Alkaline Phosphatase: 52 U/L (ref 40–150)
Anion gap: 6 (ref 3–11)
BUN: 14 mg/dL (ref 7–26)
CHLORIDE: 107 mmol/L (ref 98–109)
CO2: 26 mmol/L (ref 22–29)
Calcium: 9.7 mg/dL (ref 8.4–10.4)
Creatinine, Ser: 0.95 mg/dL (ref 0.60–1.10)
GFR calc non Af Amer: 57 mL/min — ABNORMAL LOW (ref >60)
Glucose, Bld: 103 mg/dL (ref 70–140)
Potassium: 4.4 mmol/L (ref 3.5–5.1)
SODIUM: 139 mmol/L (ref 136–145)
Total Bilirubin: 0.4 mg/dL (ref 0.2–1.2)
Total Protein: 7.7 g/dL (ref 6.4–8.3)

## 2017-06-30 LAB — VITAMIN B12: Vitamin B-12: 366 pg/mL (ref 180–914)

## 2017-06-30 LAB — RETICULOCYTES
RBC.: 3.97 MIL/uL (ref 3.70–5.45)
RETIC COUNT ABSOLUTE: 51.6 10*3/uL (ref 33.7–90.7)
RETIC CT PCT: 1.3 % (ref 0.7–2.1)

## 2017-06-30 LAB — FOLATE: Folate: 24 ng/mL (ref >5.9)

## 2017-06-30 LAB — SEDIMENTATION RATE: SED RATE: 34 mm/h — AB (ref 0–22)

## 2017-06-30 LAB — FERRITIN: FERRITIN: 34 ng/mL (ref 9–269)

## 2017-07-01 LAB — ANA W/REFLEX IF POSITIVE: ANA: NEGATIVE

## 2017-07-03 ENCOUNTER — Encounter: Payer: Self-pay | Admitting: Oncology

## 2017-07-03 ENCOUNTER — Other Ambulatory Visit: Payer: Self-pay | Admitting: Oncology

## 2017-09-04 ENCOUNTER — Other Ambulatory Visit: Payer: Self-pay

## 2017-09-04 ENCOUNTER — Other Ambulatory Visit: Payer: Self-pay | Admitting: Oncology

## 2017-09-04 DIAGNOSIS — Z170 Estrogen receptor positive status [ER+]: Principal | ICD-10-CM

## 2017-09-04 DIAGNOSIS — C50411 Malignant neoplasm of upper-outer quadrant of right female breast: Secondary | ICD-10-CM

## 2017-09-04 MED ORDER — ANASTROZOLE 1 MG PO TABS: ORAL_TABLET | ORAL | 3 refills | Status: DC

## 2017-10-29 ENCOUNTER — Other Ambulatory Visit: Payer: Self-pay | Admitting: Family Medicine

## 2017-11-05 ENCOUNTER — Other Ambulatory Visit: Payer: Self-pay | Admitting: Family Medicine

## 2017-12-18 ENCOUNTER — Other Ambulatory Visit: Payer: Self-pay | Admitting: Family Medicine

## 2018-02-22 ENCOUNTER — Encounter: Payer: Self-pay | Admitting: Family Medicine

## 2018-02-22 ENCOUNTER — Ambulatory Visit: Payer: Medicare Other | Admitting: Family Medicine

## 2018-02-22 ENCOUNTER — Other Ambulatory Visit: Payer: Self-pay

## 2018-02-22 VITALS — BP 123/81 | HR 76 | Temp 98.5°F | Resp 16 | Ht 64.0 in | Wt 163.4 lb

## 2018-02-22 DIAGNOSIS — E039 Hypothyroidism, unspecified: Secondary | ICD-10-CM | POA: Diagnosis not present

## 2018-02-22 DIAGNOSIS — E785 Hyperlipidemia, unspecified: Secondary | ICD-10-CM

## 2018-02-22 DIAGNOSIS — M542 Cervicalgia: Secondary | ICD-10-CM

## 2018-02-22 DIAGNOSIS — M818 Other osteoporosis without current pathological fracture: Secondary | ICD-10-CM

## 2018-02-22 DIAGNOSIS — Z23 Encounter for immunization: Secondary | ICD-10-CM | POA: Diagnosis not present

## 2018-02-22 LAB — HEPATIC FUNCTION PANEL
ALT: 14 U/L (ref 0–35)
AST: 24 U/L (ref 0–37)
Albumin: 4 g/dL (ref 3.5–5.2)
Alkaline Phosphatase: 41 U/L (ref 39–117)
BILIRUBIN TOTAL: 0.5 mg/dL (ref 0.2–1.2)
Bilirubin, Direct: 0.1 mg/dL (ref 0.0–0.3)
Total Protein: 7.4 g/dL (ref 6.0–8.3)

## 2018-02-22 LAB — CBC WITH DIFFERENTIAL/PLATELET
Basophils Absolute: 0 10*3/uL (ref 0.0–0.1)
Basophils Relative: 0.2 % (ref 0.0–3.0)
EOS ABS: 0.1 10*3/uL (ref 0.0–0.7)
EOS PCT: 1.3 % (ref 0.0–5.0)
HEMATOCRIT: 34.7 % — AB (ref 36.0–46.0)
HEMOGLOBIN: 11.6 g/dL — AB (ref 12.0–15.0)
LYMPHS PCT: 37.2 % (ref 12.0–46.0)
Lymphs Abs: 1.8 10*3/uL (ref 0.7–4.0)
MCHC: 33.3 g/dL (ref 30.0–36.0)
MCV: 90.8 fl (ref 78.0–100.0)
MONO ABS: 0.6 10*3/uL (ref 0.1–1.0)
Monocytes Relative: 12.6 % — ABNORMAL HIGH (ref 3.0–12.0)
Neutro Abs: 2.3 10*3/uL (ref 1.4–7.7)
Neutrophils Relative %: 48.7 % (ref 43.0–77.0)
Platelets: 287 10*3/uL (ref 150.0–400.0)
RBC: 3.82 Mil/uL — AB (ref 3.87–5.11)
RDW: 15.5 % (ref 11.5–15.5)
WBC: 4.7 10*3/uL (ref 4.0–10.5)

## 2018-02-22 LAB — BASIC METABOLIC PANEL
BUN: 13 mg/dL (ref 6–23)
CALCIUM: 9.4 mg/dL (ref 8.4–10.5)
CHLORIDE: 106 meq/L (ref 96–112)
CO2: 29 mEq/L (ref 19–32)
CREATININE: 0.89 mg/dL (ref 0.40–1.20)
GFR: 79.36 mL/min (ref >60.00)
Glucose, Bld: 85 mg/dL (ref 70–99)
Potassium: 3.9 mEq/L (ref 3.5–5.1)
Sodium: 139 mEq/L (ref 135–145)

## 2018-02-22 LAB — VITAMIN D 25 HYDROXY (VIT D DEFICIENCY, FRACTURES): VITD: 30.57 ng/mL (ref 30.00–100.00)

## 2018-02-22 LAB — LIPID PANEL
CHOL/HDL RATIO: 3
Cholesterol: 210 mg/dL — ABNORMAL HIGH (ref 0–200)
HDL: 64.4 mg/dL (ref >39.00)
LDL CALC: 134 mg/dL — AB (ref 0–99)
NONHDL: 145.51
Triglycerides: 58 mg/dL (ref 0.0–149.0)
VLDL: 11.6 mg/dL (ref 0.0–40.0)

## 2018-02-22 LAB — TSH: TSH: 3.18 u[IU]/mL (ref 0.35–4.50)

## 2018-02-22 NOTE — Patient Instructions (Signed)
Schedule your complete physical in 6 months We'll notify you of your lab results and make any changes if needed Continue to work on healthy diet and regular exercise- you look great!!! Try sleeping on just 1 pillow at a time and see if this improves your neck pain Continue the Claritin daily to improve post-nasal drip/drainage.  Take this until the first frost Call with any questions or concerns Happy Fall!!!

## 2018-02-22 NOTE — Progress Notes (Signed)
   Subjective:    Patient ID: Monica Diaz, female    DOB: January 22, 1941, 77 y.o.   MRN: [id number]  HPI Hyperlipidemia- chronic problem.  Pt was prescribed Crestor but not taking x6-8 weeks bc 'they wouldn't renew it'.  Pt has gained 4 lbs.  Denies CP, SOB, abd pain, N/V.  Hypothyroid- chronic problem, on Armour Thyroid 30mg  daily.  Pt reports good energy, no changes to skin/hair/nails  Osteoporosis- due for Vit D level.  Neck pain- pt has bought 2 different pillows, painful to turn neck.  Pain occurs when she wakes in the AM, wears off during the day.  Pt is using both pillows at the same time- '1 on top of the other'.     Review of Systems For ROS see HPI     Objective:   Physical Exam  Constitutional: She is oriented to person, place, and time. She appears well-developed and well-nourished. No distress.  HENT:  Head: Normocephalic and atraumatic.  Copious PND  Eyes: Pupils are equal, round, and reactive to light. Conjunctivae and EOM are normal.  Neck: Normal range of motion. Neck supple. No thyromegaly present.  Cardiovascular: Normal rate, regular rhythm, normal heart sounds and intact distal pulses.  No murmur heard. Pulmonary/Chest: Effort normal and breath sounds normal. No respiratory distress.  Abdominal: Soft. She exhibits no distension. There is no tenderness.  Musculoskeletal: She exhibits no edema.  Lymphadenopathy:    She has no cervical adenopathy.  Neurological: She is alert and oriented to person, place, and time.  Skin: Skin is warm and dry.  Psychiatric: She has a normal mood and affect. Her behavior is normal.  Vitals reviewed.         Assessment & Plan:  Neck pain- new.  Suspect this is due to her pillows being too high (she's using 2 new pillows).  Encouraged her to try 1 at a time.  If no improvement, can consider imaging but since pain only occurs upon waking and improves throughout the day I doubt arthritic changes.  Pt expressed understanding  and is in agreement w/ plan.

## 2018-02-22 NOTE — Assessment & Plan Note (Signed)
Chronic problem.  Currently asymptomatic.  Check labs.  Adjust meds prn  

## 2018-02-22 NOTE — Assessment & Plan Note (Signed)
Chronic problem.  Pt has been out of medication for nearly 2 months.  Check labs and determine if we need to restart.

## 2018-02-22 NOTE — Assessment & Plan Note (Signed)
Chronic problem.  UTD on DEXA.  On Fosamax weekly.  Check Vit D level and replete prn.

## 2018-03-16 ENCOUNTER — Other Ambulatory Visit: Payer: Self-pay | Admitting: Family Medicine

## 2018-04-25 ENCOUNTER — Other Ambulatory Visit: Payer: Self-pay | Admitting: Family Medicine

## 2018-05-02 ENCOUNTER — Other Ambulatory Visit: Payer: Self-pay | Admitting: Oncology

## 2018-05-02 DIAGNOSIS — Z9889 Other specified postprocedural states: Secondary | ICD-10-CM

## 2018-05-07 ENCOUNTER — Telehealth: Payer: Self-pay | Admitting: Oncology

## 2018-05-07 NOTE — Telephone Encounter (Signed)
R/s appt per 1/10 sch message - called patient - unable to reach patient- left message and sent reminder letter in the mail .

## 2018-06-11 ENCOUNTER — Other Ambulatory Visit: Payer: Self-pay | Admitting: Oncology

## 2018-06-11 ENCOUNTER — Ambulatory Visit
Admission: RE | Admit: 2018-06-11 | Discharge: 2018-06-11 | Disposition: A | Discharge status: Home / Self Care | Payer: Medicare Other | Source: Ambulatory Visit | Attending: Oncology | Admitting: Oncology

## 2018-06-11 ENCOUNTER — Inpatient Hospital Stay: Payer: Medicare Other | Attending: Family Medicine

## 2018-06-11 DIAGNOSIS — Z79811 Long term (current) use of aromatase inhibitors: Secondary | ICD-10-CM | POA: Diagnosis not present

## 2018-06-11 DIAGNOSIS — Z170 Estrogen receptor positive status [ER+]: Secondary | ICD-10-CM | POA: Insufficient documentation

## 2018-06-11 DIAGNOSIS — C50411 Malignant neoplasm of upper-outer quadrant of right female breast: Secondary | ICD-10-CM | POA: Insufficient documentation

## 2018-06-11 DIAGNOSIS — Z923 Personal history of irradiation: Secondary | ICD-10-CM | POA: Insufficient documentation

## 2018-06-11 DIAGNOSIS — M8580 Other specified disorders of bone density and structure, unspecified site: Secondary | ICD-10-CM | POA: Diagnosis not present

## 2018-06-11 DIAGNOSIS — Z9889 Other specified postprocedural states: Secondary | ICD-10-CM

## 2018-06-11 DIAGNOSIS — R921 Mammographic calcification found on diagnostic imaging of breast: Secondary | ICD-10-CM

## 2018-06-11 LAB — COMPREHENSIVE METABOLIC PANEL
ALBUMIN: 3.6 g/dL (ref 3.5–5.0)
ALT: 11 U/L (ref 0–44)
AST: 20 U/L (ref 15–41)
Alkaline Phosphatase: 47 U/L (ref 38–126)
Anion gap: 6 (ref 5–15)
BILIRUBIN TOTAL: 0.3 mg/dL (ref 0.3–1.2)
BUN: 11 mg/dL (ref 8–23)
CALCIUM: 9 mg/dL (ref 8.9–10.3)
CO2: 24 mmol/L (ref 22–32)
Chloride: 108 mmol/L (ref 98–111)
Creatinine, Ser: 0.91 mg/dL (ref 0.44–1.00)
GFR calc Af Amer: >60 mL/min (ref >60)
GFR calc non Af Amer: >60 mL/min (ref >60)
GLUCOSE: 93 mg/dL (ref 70–99)
POTASSIUM: 3.9 mmol/L (ref 3.5–5.1)
Sodium: 138 mmol/L (ref 135–145)
TOTAL PROTEIN: 7.4 g/dL (ref 6.5–8.1)

## 2018-06-11 LAB — CBC WITH DIFFERENTIAL/PLATELET
Abs Immature Granulocytes: 0.01 10*3/uL (ref 0.00–0.07)
Basophils Absolute: 0 10*3/uL (ref 0.0–0.1)
Basophils Relative: 0 %
Eosinophils Absolute: 0 10*3/uL (ref 0.0–0.5)
Eosinophils Relative: 1 %
HCT: 33.8 % — ABNORMAL LOW (ref 36.0–46.0)
Hemoglobin: 10.9 g/dL — ABNORMAL LOW (ref 12.0–15.0)
Immature Granulocytes: 0 %
Lymphocytes Relative: 29 %
Lymphs Abs: 1.4 10*3/uL (ref 0.7–4.0)
MCH: 29.6 pg (ref 26.0–34.0)
MCHC: 32.2 g/dL (ref 30.0–36.0)
MCV: 91.8 fL (ref 80.0–100.0)
MONO ABS: 0.5 10*3/uL (ref 0.1–1.0)
MONOS PCT: 9 %
NEUTROS ABS: 3.1 10*3/uL (ref 1.7–7.7)
Neutrophils Relative %: 61 %
PLATELETS: 231 10*3/uL (ref 150–400)
RBC: 3.68 MIL/uL — ABNORMAL LOW (ref 3.87–5.11)
RDW: 14.5 % (ref 11.5–15.5)
WBC: 5 10*3/uL (ref 4.0–10.5)
nRBC: 0 % (ref 0.0–0.2)

## 2018-06-14 ENCOUNTER — Ambulatory Visit: Payer: Medicare Other | Admitting: Oncology

## 2018-06-14 ENCOUNTER — Other Ambulatory Visit: Payer: Self-pay | Admitting: Family Medicine

## 2018-06-18 ENCOUNTER — Ambulatory Visit
Admission: RE | Admit: 2018-06-18 | Discharge: 2018-06-18 | Disposition: A | Discharge status: Home / Self Care | Payer: Medicare Other | Source: Ambulatory Visit | Attending: Oncology | Admitting: Oncology

## 2018-06-18 ENCOUNTER — Other Ambulatory Visit: Payer: Self-pay | Admitting: Oncology

## 2018-06-18 DIAGNOSIS — R921 Mammographic calcification found on diagnostic imaging of breast: Secondary | ICD-10-CM

## 2018-06-18 DIAGNOSIS — Z9889 Other specified postprocedural states: Secondary | ICD-10-CM

## 2018-06-18 HISTORY — PX: BREAST BIOPSY: SHX20

## 2018-06-19 NOTE — Progress Notes (Signed)
Monica Diaz  [phone] [phone]     ID: [name] OB: [birth year]  MR#: [id number]  CSN#:[id number]  Patient Care Team: Midge Minium, MD as PCP - General Dohmeier, Asencion Partridge, MD as Consulting Physician (Neurology) Juanita Craver, MD as Consulting Physician (Gastroenterology) Magrinat, Virgie Dad, MD as Consulting Physician (Oncology) Roseanne Kaufman, MD as Consulting Physician (Orthopedic Surgery)  CHIEF COMPLAINT: Estrogen receptor positive breast cancer  CURRENT TREATMENT: Anastrozole  BREAST CANCER HISTORY: From the original intake note:  Arretta had bilateral screening mammography at the breast center 04/15/2013. This showed a possible mass in the right breast. Diagnostic right mammography and ultrasonography 05/09/2013 showed an irregular area of focal asymmetry in the right breast, which was not palpable. Ultrasound showed dystrophic calcification close to the area of asymmetry. The area of abnormality measured approximately 1.4 cm.  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 HER-2 was reported as negative during the multidisciplinary breast cancer conference 05/22/2013. The written report is pending.   On 05/20/2013 the patient underwent bilateral breast MRI which showed a 2.3 cm irregular enhancing mass in the right breast at the 12:00 position. There were no other areas of concern in either breast and there were no abnormal appearing lymph nodes. However incidentally a 6 mm enhancing right paramedian sternal lesion was noted.  The patient's subsequent history is as detailed below   INTERVAL HISTORY: Avanthika returns today for follow-up of her estrogen receptor positive breast cancer. She continues on  anastrozole, with good tolerance. She denies vaginal dryness. She reports a warm and tingling feeling but notes it is very mild.  Her daughter in [place] also participated in today's discussion.  Since her last visit, Enis underwent diagnostic bilateral mammogram on 06/11/2018 showing: breast density category C; indeterminate coarse heterogeneous calcifications spanning approximately 1.4 cm in the subareolar left breast; no mammographic evidence of malignancy involving the right breast. Biopsy of the subareolar left breast ([id number]) on 06/18/2018 was benign.  Her most recent bone density on 76/02/2018 showed a T score of -2.0 osteopenia.   REVIEW OF SYSTEMS: Ashunti reports not exercising outside of the walking she does while she's working. She states she misses going to the gym, but notes it's hard when she works 9 am - 6 pm 5 days a week. She notes she does enjoy her job. She reports a chronic cough, possibly related to reflux. The patient denies unusual headaches, visual changes, nausea, vomiting, stiff neck, dizziness, or gait imbalance. There has been no phlegm production, or pleurisy, no chest pain or pressure, and no change in bowel or bladder habits. The patient denies fever, rash, bleeding, unexplained fatigue or unexplained weight loss. A detailed review of systems was otherwise entirely negative.   PAST MEDICAL HISTORY: Past Medical History:  Diagnosis Date  . Arthritis   . Breast cancer ([place]) 2015  . Cancer ([place]) 2015   breast  . HOH (hard of hearing)   . Hx of radiation therapy 07/18/13-08/14/13   right breast total dose 50 Gy  . Hyperlipidemia   . Osteoporosis   . Personal history of radiation therapy 2015  . Seasonal allergies   . Sleep apnea    uses a cpap  . Thyroid disease   . Wears glasses     PAST SURGICAL HISTORY:  Past Surgical History:  Procedure Laterality Date  . ABDOMINAL HYSTERECTOMY     fibroids  . BREAST BIOPSY Right 2015   core   . BREAST  EXCISIONAL BIOPSY Right 2005  . BREAST LUMPECTOMY Right   . COLONOSCOPY    . LEFT OOPHORECTOMY    . PARTIAL MASTECTOMY WITH NEEDLE LOCALIZATION AND AXILLARY SENTINEL LYMPH NODE BX Right 05/28/2013   Procedure: PARTIAL MASTECTOMY WITH NEEDLE LOCALIZATION AND AXILLARY SENTINEL LYMPH NODE BIOPSY;  Surgeon: Adin Hector, MD;  Location: [facility];  Service: General;  Laterality: Right;    FAMILY HISTORY Family History  Problem Relation Age of Onset  . Hypertension Mother   . Diabetes Maternal Aunt   . Stroke Unknown        grandfather  . Breast cancer Neg Hx   The patient knows little about her father. Her mother is living, 12 years old as of January 2015. The patient was an only child. There is no history of breast or ovarian cancer in the family  GYNECOLOGIC HISTORY:  Menarche age 78, first live birth age 78, the patient is [place] P2. 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 She is divorced and lives alone with [name], who are two nonverbal parakeets. Her daughter [name] is a housewife in [place]. Her daughter Meshelle Holness is a college professor in [employer], teaching urban education. The patient's niece [name] MD is a neurologist working at [employer]: Not in place. The patient was given the appropriate documents at the time of her first visit here 05/22/2013 to complete and notarize at her discretion. She intends to name her daughter [name] as her healthcare power of attorney. She can be reached at [place]: Social History   Tobacco Use  . Smoking status: Never Smoker  . Smokeless tobacco: Never Used  Substance Use Topics  . Alcohol use: Yes    Comment: occasionally  . Drug  use: No     Colonoscopy: 76/05/2016, normal, Dr. Collene Mares, no repeat recommended due to age  PAP:  Bone density:06/05/2017, T-score of -2.0 psteopenia  Lipid panel: 02/22/2018, elevated LDL  Allergies  Allergen Reactions  . Codeine     REACTION: sick to stomach    Current Outpatient Medications  Medication Sig Dispense Refill  . alendronate (FOSAMAX) 70 MG tablet TAKE 1 TABLET BY MOUTH EVERY WEEK WITH A FULL GLASS OF WATER ON AN EMPTY STOMACH 12 tablet 3  . anastrozole (ARIMIDEX) 1 MG tablet TAKE 1 TABLET(1 MG) BY MOUTH DAILY 90 tablet 3  . calcium citrate-vitamin D (CITRACAL+D) 315-200 MG-UNIT per tablet Take 1 tablet by mouth 2 (two) times daily.    . clonazepam (KLONOPIN) 0.125 MG disintegrating tablet Take 1 tablet by mouth prior to speeches. 30 tablet 0  . Coenzyme Q10 (COQ10) 200 MG CAPS Take 1 capsule by mouth at bedtime.    Marland Kitchen loratadine (CLARITIN) 10 MG tablet Take 10 mg by mouth daily as needed.     . mometasone (NASONEX) 50 MCG/ACT nasal spray Place 2 sprays into the nose daily as needed. 17 g 6  . Multiple Vitamins-Minerals (CENTRUM SILVER PO) Take 1 tablet by mouth.    . NP THYROID 30 MG tablet TAKE 1 TABLET BY MOUTH EVERY DAY 90 tablet  0  . pyridoxine (B-6) 100 MG tablet Take 100 mg by mouth 2 (two) times daily.    . Red Yeast Rice 500 MG/0.5GM POWD Take by mouth.    . vitamin B-12 (CYANOCOBALAMIN) 50 MCG tablet Take 50 mcg by mouth.    . vitamin C (ASCORBIC ACID) 500 MG tablet Take 500 mg by mouth 2 (two) times daily.     No current facility-administered medications for this visit.     OBJECTIVE: Middle-aged [place] American woman in no acute distress  Vitals:   06/20/18 1128  BP: (!) 147/99  Pulse: 76  Resp: 18  Temp: 98.5 F (36.9 C)  SpO2: 100%     Body mass index is 28.63 kg/m.    ECOG FS:1 - Symptomatic but completely ambulatory  Sclerae unicteric, EOMs intact Oropharynx clear and moist No cervical or supraclavicular adenopathy Lungs no rales or  rhonchi Heart regular rate and rhythm Abd soft, nontender, positive bowel sounds MSK no focal spinal tenderness, no upper extremity lymphedema Neuro: nonfocal, well oriented, appropriate affect Breasts: The right breast is status post lumpectomy and radiation.  There is still some hyperpigmentation.  The skin is slightly coarsened.  There is no evidence of local recurrence.  The left breast is status post recent biopsy.  It is otherwise unremarkable.  Both axillae are benign.  LAB RESULTS:  CMP     Component Value Date/Time   NA 138 06/11/2018 1148   NA 139 06/16/2016 1300   K 3.9 06/11/2018 1148   K 4.1 06/16/2016 1300   CL 108 06/11/2018 1148   CO2 24 06/11/2018 1148   CO2 24 06/16/2016 1300   GLUCOSE 93 06/11/2018 1148   GLUCOSE 92 06/16/2016 1300   BUN 11 06/11/2018 1148   BUN 8.7 06/16/2016 1300   CREATININE 0.91 06/11/2018 1148   CREATININE 0.8 06/16/2016 1300   CALCIUM 9.0 06/11/2018 1148   CALCIUM 9.4 06/16/2016 1300   PROT 7.4 06/11/2018 1148   PROT 7.6 06/16/2016 1300   ALBUMIN 3.6 06/11/2018 1148   ALBUMIN 3.6 06/16/2016 1300   AST 20 06/11/2018 1148   AST 18 06/16/2016 1300   ALT 11 06/11/2018 1148   ALT 12 06/16/2016 1300   ALKPHOS 47 06/11/2018 1148   ALKPHOS 55 06/16/2016 1300   BILITOT 0.3 06/11/2018 1148   BILITOT 0.36 06/16/2016 1300   GFRNONAA >60 06/11/2018 1148   GFRAA >60 06/11/2018 1148    I No results found for: SPEP  Lab Results  Component Value Date   WBC 5.0 06/11/2018   NEUTROABS 3.1 06/11/2018   HGB 10.9 (L) 06/11/2018   HCT 33.8 (L) 06/11/2018   MCV 91.8 06/11/2018   PLT 231 06/11/2018      Chemistry      Component Value Date/Time   NA 138 06/11/2018 1148   NA 139 06/16/2016 1300   K 3.9 06/11/2018 1148   K 4.1 06/16/2016 1300   CL 108 06/11/2018 1148   CO2 24 06/11/2018 1148   CO2 24 06/16/2016 1300   BUN 11 06/11/2018 1148   BUN 8.7 06/16/2016 1300   CREATININE 0.91 06/11/2018 1148   CREATININE 0.8 06/16/2016 1300       Component Value Date/Time   CALCIUM 9.0 06/11/2018 1148   CALCIUM 9.4 06/16/2016 1300   ALKPHOS 47 06/11/2018 1148   ALKPHOS 55 06/16/2016 1300   AST 20 06/11/2018 1148   AST 18 06/16/2016 1300   ALT 11 06/11/2018 1148   ALT 12 06/16/2016 1300  BILITOT 0.3 06/11/2018 1148   BILITOT 0.36 06/16/2016 1300       No results found for: LABCA2  No components found for: [id number]  No results for input(s): INR in the last 168 hours.  Urinalysis No results found for: COLORURINE  STUDIES:Mm Diag Breast Tomo Bilateral  Result Date: 06/11/2018 CLINICAL DATA:  Malignant lumpectomy of the UPPER OUTER QUADRANT of the RIGHT breast in 2015 with adjuvant radiation therapy.Annual evaluation. EXAM: DIGITAL DIAGNOSTIC BILATERAL MAMMOGRAM WITH CAD AND TOMO COMPARISON:  Previous exam(s). ACR Breast Density Category c: The breast tissue is heterogeneously dense, which may obscure small masses. FINDINGS: Tomosynthesis and synthesized full field CC and MLO views of both breasts were obtained. Standard spot magnification CC view of the lumpectomy site in the RIGHT breast and standard spot magnification CC and mediolateral views of LEFT breast calcifications were also obtained. Post surgical scar/architectural distortion at the lumpectomy site in the UPPER OUTER RIGHT breast at MIDDLE depth. Dystrophic calcifications adjacent to the lumpectomy site. No new or suspicious findings in the RIGHT breast. Coarse heterogeneous calcifications involving the subareolar LEFT breast which span approximately 1.4 x 1.4 x 0.8 cm and which may be intraductal in location. These calcifications are immediately POSTERIOR to a benign coarse dystrophic appearing calcification with a linear component. No suspicious findings elsewhere in the LEFT breast. Mammographic images were processed with CAD. IMPRESSION: 1. Indeterminate coarse heterogeneous calcifications spanning approximately 1.4 cm in the subareolar LEFT breast. 2. Expected post  lumpectomy changes involving the RIGHT breast. No mammographic evidence of malignancy involving the RIGHT breast. RECOMMENDATION: Stereotactic core needle biopsy of the indeterminate LEFT breast calcifications. The stereotactic core needle biopsy procedure was discussed with the patient and her questions were answered. She has agreed to proceed and the biopsy has been scheduled at her convenience. I have discussed the findings and recommendations with the patient. Results were also provided in writing at the conclusion of the visit. BI-RADS CATEGORY  4: Suspicious. Electronically Signed   By: Evangeline Dakin M.D.   On: 06/11/2018 16:39   Mm Clip Placement Left  Result Date: 06/18/2018 CLINICAL DATA:  Stereotactic biopsy was performed of a group of coarse calcifications in the retroareolar left breast. EXAM: DIAGNOSTIC LEFT MAMMOGRAM POST STEREOTACTIC BIOPSY COMPARISON:  Previous exam(s). FINDINGS: Mammographic images were obtained following stereotactic guided biopsy of left breast calcifications. The coil shaped biopsy clip is satisfactorily positioned within the biopsied group of calcifications in the central retroareolar left breast. There are multiple calcifications remaining. IMPRESSION: Satisfactory position of coil shaped biopsy clip. Final Assessment: Post Procedure Mammograms for Marker Placement Electronically Signed   By: Curlene Dolphin M.D.   On: 06/18/2018 10:01   Mm Lt Breast Bx W Loc Dev 1st Lesion Image Bx Spec Stereo Guide  Addendum Date: 06/19/2018   ADDENDUM REPORT: 06/19/2018 14:24 ADDENDUM: Pathology revealed DENSE FIBROSIS WITH CALCIFICATIONS AND CHRONIC INFLAMMATION, FIBROCYSTIC CHANGE of the Left breast, retroareolar. This was found to be concordant by Dr. Curlene Dolphin. Pathology results were discussed with the patient by telephone. The patient reported doing well after the biopsy with tenderness at the site. Post biopsy instructions and care were reviewed and questions were answered.  The patient was encouraged to call [facility] for any additional concerns. The patient was instructed to return for annual screening mammography in February 2021. Pathology results reported by Terie Purser, RN on 06/19/2018. Electronically Signed   By: Curlene Dolphin M.D.   On: 06/19/2018 14:24   Result  Date: 06/19/2018 CLINICAL DATA:  Stereotactic biopsy was recommended of calcifications in the retroareolar left breast. The patient has a personal history of contralateral right breast cancer. EXAM: LEFT BREAST STEREOTACTIC CORE NEEDLE BIOPSY COMPARISON:  Previous exams. FINDINGS: The patient and I discussed the procedure of stereotactic-guided biopsy including benefits and alternatives. We discussed the high likelihood of a successful procedure. We discussed the risks of the procedure including infection, bleeding, tissue injury, clip migration, and inadequate sampling. Informed written consent was given. The usual time out protocol was performed immediately prior to the procedure. Using sterile technique and 1% Lidocaine with and without epinephrine as local anesthetic, under stereotactic guidance, a 9 gauge vacuum assisted device was used to perform core needle biopsy of calcifications in the central retroareolar left breast were targeted using a lateral approach. Specimen radiograph was performed showing approximately 3 calcifications. Specimens with calcifications are identified for pathology. Lesion quadrant: Lower inner quadrant At the conclusion of the procedure, a coil tissue marker clip was deployed into the biopsy cavity. Follow-up 2-view mammogram was performed and dictated separately. IMPRESSION: Stereotactic-guided biopsy of the left breast. No apparent complications. Electronically Signed: By: Curlene Dolphin M.D. On: 06/18/2018 10:02    ASSESSMENT: 78 y.o. [place] woman status post right breast biopsy 05/14/2013 for a clinical T2 N0, stage IIA invasive ductal  carcinoma, grade 2 or 3, estrogen receptor 99% positive, progesterone receptor 50% positive, with an MIB-1 of 91%, and no HER-2 amplification  (1) 6 mm enhancing right sternal lesion noted by breast MRI 05/20/2013-- negative on PET 06/04/2013  (2) status post right lumpectomy and sentinel lymph node sampling to 07/12/2013 for a pT2 pN0, stage IIA invasive ductal carcinoma, mucinous, grade 2, with repeat HER-2 again negative.  (a0 left axillary lymph node biopsy 06/07/2017 benign reactive lymph node  (3) Oncotype score of 18 predicts a 10 year risk of outside the breast recurrence of 12% if the patient's only systemic treatment is tamoxifen for 5 years. It also predicts a minimal benefit from chemotherapy.  (4) adjuvant radiation completed 08/14/2013  (5) anastrozole started 09/06/2013, to be discontinued 09/12/2018  (a) repeat bone density scan 04/13/2014 showed osteopenia with a T score of -1.4  (b) bone density on 06/05/2017 showed a T score of -2.0 osteopenia  PLAN: Emmamarie is now just about 5 years out from definitive surgery for her breast cancer with no evidence of disease recurrence.  This is very favorable.  We discussed the data on continuing anastrozole.  An additional 2 years might result in an additional 2% or so reduction in the risk of recurrence.  This means that of 100 women about 2 would benefit.  For these reasons most patients choose to go off anastrozole at 5 years and I am certainly comfortable with that, which is also Melanny's decision.  She had many questions regarding her numerous breast biopsies.  She has some inflammation and some fat necrosis issues which are very common.  Her breasts are simply not easy to see through partly because they are moderately dense.  Accordingly she is at risk of further false positives in the future.  Fortunately however these have all been benign and hopefully they will continue that way.  She certainly has a very good prognosis moving  forward  I am not sure why her hemoglobin is a little under 11.  She denies any bleeding.  The MCV has not changed, and she has normal renal function.  She will follow-up regarding this with her primary care  physician.  Also she has noted that lately her diastolic blood pressure seems a little bit on the high side.  That also she will continue to evaluate through Dr. Birdie Riddle  I offered graduation but she actually prefers to continue to be seen here in a once a year basis and so she will see me again in a year from now.  She knows to call for any other issues that may develop before the next visit.  Magrinat, Virgie Dad, MD  06/20/18 12:15 PM Medical Oncology and Hematology [facility] [address] [place], [place] [address] Tel. [phone]    Fax. [phone]  I, Wilburn Mylar, am acting as scribe for Dr. Virgie Dad. Magrinat.  I, Lurline Del MD, have reviewed the above documentation for accuracy and completeness, and I agree with the above.

## 2018-06-20 ENCOUNTER — Inpatient Hospital Stay: Payer: Medicare Other | Admitting: Oncology

## 2018-06-20 ENCOUNTER — Telehealth: Payer: Self-pay | Admitting: Oncology

## 2018-06-20 VITALS — BP 147/99 | HR 76 | Temp 98.5°F | Resp 18 | Ht 64.0 in | Wt 166.8 lb

## 2018-06-20 DIAGNOSIS — M8580 Other specified disorders of bone density and structure, unspecified site: Secondary | ICD-10-CM

## 2018-06-20 DIAGNOSIS — Z170 Estrogen receptor positive status [ER+]: Secondary | ICD-10-CM

## 2018-06-20 DIAGNOSIS — Z923 Personal history of irradiation: Secondary | ICD-10-CM | POA: Diagnosis not present

## 2018-06-20 DIAGNOSIS — Z79811 Long term (current) use of aromatase inhibitors: Secondary | ICD-10-CM

## 2018-06-20 DIAGNOSIS — C50411 Malignant neoplasm of upper-outer quadrant of right female breast: Secondary | ICD-10-CM | POA: Diagnosis not present

## 2018-06-20 NOTE — Telephone Encounter (Signed)
Gave avs and calendar ° °

## 2018-07-17 ENCOUNTER — Ambulatory Visit: Payer: Medicare Other | Admitting: Physical Therapy

## 2018-08-13 ENCOUNTER — Other Ambulatory Visit: Payer: Self-pay | Admitting: Oncology

## 2018-08-13 DIAGNOSIS — C50411 Malignant neoplasm of upper-outer quadrant of right female breast: Secondary | ICD-10-CM

## 2018-08-13 DIAGNOSIS — Z170 Estrogen receptor positive status [ER+]: Principal | ICD-10-CM

## 2018-08-22 ENCOUNTER — Encounter: Payer: Medicare Other | Admitting: Family Medicine

## 2018-09-12 ENCOUNTER — Other Ambulatory Visit: Payer: Self-pay | Admitting: Family Medicine

## 2018-11-30 ENCOUNTER — Encounter: Payer: Medicare Other | Admitting: Family Medicine

## 2018-12-06 ENCOUNTER — Ambulatory Visit (INDEPENDENT_AMBULATORY_CARE_PROVIDER_SITE_OTHER): Payer: Medicare Other | Admitting: Family Medicine

## 2018-12-06 ENCOUNTER — Encounter

## 2018-12-06 ENCOUNTER — Encounter: Payer: Self-pay | Admitting: Family Medicine

## 2018-12-06 ENCOUNTER — Other Ambulatory Visit: Payer: Self-pay

## 2018-12-06 VITALS — BP 124/78 | HR 74 | Temp 97.9°F | Resp 16 | Ht 64.0 in | Wt 163.0 lb

## 2018-12-06 DIAGNOSIS — E559 Vitamin D deficiency, unspecified: Secondary | ICD-10-CM | POA: Diagnosis not present

## 2018-12-06 DIAGNOSIS — E785 Hyperlipidemia, unspecified: Secondary | ICD-10-CM | POA: Diagnosis not present

## 2018-12-06 DIAGNOSIS — Z0000 Encounter for general adult medical examination without abnormal findings: Secondary | ICD-10-CM | POA: Diagnosis not present

## 2018-12-06 LAB — CBC WITH DIFFERENTIAL/PLATELET
Basophils Absolute: 0 10*3/uL (ref 0.0–0.1)
Basophils Relative: 0.3 % (ref 0.0–3.0)
Eosinophils Absolute: 0.1 10*3/uL (ref 0.0–0.7)
Eosinophils Relative: 1.3 % (ref 0.0–5.0)
HCT: 34.8 % — ABNORMAL LOW (ref 36.0–46.0)
Hemoglobin: 11.7 g/dL — ABNORMAL LOW (ref 12.0–15.0)
Lymphocytes Relative: 36.8 % (ref 12.0–46.0)
Lymphs Abs: 1.7 10*3/uL (ref 0.7–4.0)
MCHC: 33.7 g/dL (ref 30.0–36.0)
MCV: 91.7 fl (ref 78.0–100.0)
Monocytes Absolute: 0.5 10*3/uL (ref 0.1–1.0)
Monocytes Relative: 12.1 % — ABNORMAL HIGH (ref 3.0–12.0)
Neutro Abs: 2.2 10*3/uL (ref 1.4–7.7)
Neutrophils Relative %: 49.5 % (ref 43.0–77.0)
Platelets: 254 10*3/uL (ref 150.0–400.0)
RBC: 3.79 Mil/uL — ABNORMAL LOW (ref 3.87–5.11)
RDW: 14.9 % (ref 11.5–15.5)
WBC: 4.5 10*3/uL (ref 4.0–10.5)

## 2018-12-06 LAB — BASIC METABOLIC PANEL
BUN: 14 mg/dL (ref 6–23)
CO2: 25 mEq/L (ref 19–32)
Calcium: 9.3 mg/dL (ref 8.4–10.5)
Chloride: 107 mEq/L (ref 96–112)
Creatinine, Ser: 0.84 mg/dL (ref 0.40–1.20)
GFR: 79.65 mL/min (ref >60.00)
Glucose, Bld: 92 mg/dL (ref 70–99)
Potassium: 4.2 mEq/L (ref 3.5–5.1)
Sodium: 139 mEq/L (ref 135–145)

## 2018-12-06 LAB — HEPATIC FUNCTION PANEL
ALT: 10 U/L (ref 0–35)
AST: 18 U/L (ref 0–37)
Albumin: 4.1 g/dL (ref 3.5–5.2)
Alkaline Phosphatase: 43 U/L (ref 39–117)
Bilirubin, Direct: 0.1 mg/dL (ref 0.0–0.3)
Total Bilirubin: 0.4 mg/dL (ref 0.2–1.2)
Total Protein: 7.2 g/dL (ref 6.0–8.3)

## 2018-12-06 LAB — LIPID PANEL
Cholesterol: 228 mg/dL — ABNORMAL HIGH (ref 0–200)
HDL: 66 mg/dL (ref >39.00)
LDL Cholesterol: 152 mg/dL — ABNORMAL HIGH (ref 0–99)
NonHDL: 162.3
Total CHOL/HDL Ratio: 3
Triglycerides: 51 mg/dL (ref 0.0–149.0)
VLDL: 10.2 mg/dL (ref 0.0–40.0)

## 2018-12-06 LAB — VITAMIN D 25 HYDROXY (VIT D DEFICIENCY, FRACTURES): VITD: 32.81 ng/mL (ref 30.00–100.00)

## 2018-12-06 LAB — TSH: TSH: 4.45 u[IU]/mL (ref 0.35–4.50)

## 2018-12-06 NOTE — Assessment & Plan Note (Signed)
Chronic problem.  Attempting to control w/ diet and exercise.  Check labs and determine if tx needed.

## 2018-12-06 NOTE — Progress Notes (Signed)
   Subjective:    Patient ID: Monica Diaz, female    DOB: 1941-04-11, 78 y.o.   MRN: [id number]  HPI CPE- UTD on mammo, DEXA, colonoscopy, immunizations.  Pt has formally retired   Review of Systems Patient reports no vision/ hearing changes, adenopathy,fever, weight change,  persistant/recurrent hoarseness , swallowing issues, chest pain, palpitations, edema, persistant/recurrent cough, hemoptysis, dyspnea (rest/exertional/paroxysmal nocturnal), gastrointestinal bleeding (melena, rectal bleeding), abdominal pain, significant heartburn, bowel changes, GU symptoms (dysuria, hematuria, incontinence), Gyn symptoms (abnormal  bleeding, pain),  syncope, focal weakness, memory loss, numbness & tingling, skin/hair/nail changes, abnormal bruising or bleeding, anxiety, or depression.     Objective:   Physical Exam General Appearance:    Alert, cooperative, no distress, appears stated age  Head:    Normocephalic, without obvious abnormality, atraumatic  Eyes:    PERRL, conjunctiva/corneas clear, EOM's intact, fundi    benign, both eyes  Ears:    Normal TM's and external ear canals, both ears  Nose:   Deferred due to COVID  Throat:   Neck:   Supple, symmetrical, trachea midline, no adenopathy;    Thyroid: no enlargement/tenderness/nodules  Back:     Symmetric, no curvature, ROM normal, no CVA tenderness  Lungs:     Clear to auscultation bilaterally, respirations unlabored  Chest Wall:    No tenderness or deformity   Heart:    Regular rate and rhythm, S1 and S2 normal, no murmur, rub   or gallop  Breast Exam:    Deferred to mammo  Abdomen:     Soft, non-tender, bowel sounds active all four quadrants,    no masses, no organomegaly  Genitalia:    Deferred   Rectal:    Extremities:   Extremities normal, atraumatic, no cyanosis or edema  Pulses:   2+ and symmetric all extremities  Skin:   Skin color, texture, turgor normal, no rashes or lesions  Lymph nodes:   Cervical, supraclavicular, and  axillary nodes normal  Neurologic:   CNII-XII intact, normal strength, sensation and reflexes    throughout          Assessment & Plan:

## 2018-12-06 NOTE — Patient Instructions (Addendum)
Follow up in 1 year or as needed We'll notify you of your lab results and make any changes if needed Keep up the good work on healthy diet and regular exercise- you look great! START the Claritin daily to improve your congestion Call with any questions or concerns Stay Safe!! ENJOY YOUR RETIREMENT!!!

## 2018-12-06 NOTE — Assessment & Plan Note (Signed)
Pt's PE WNL.  UTD on mammo, DEXA, colonoscopy, immunizations.  Check labs.  Anticipatory guidance provided.  °

## 2018-12-06 NOTE — Assessment & Plan Note (Signed)
Pt has hx of this.  Check labs and replete prn. 

## 2018-12-07 ENCOUNTER — Encounter: Payer: Self-pay | Admitting: General Practice

## 2018-12-11 ENCOUNTER — Other Ambulatory Visit: Payer: Self-pay | Admitting: Family Medicine

## 2018-12-27 ENCOUNTER — Encounter: Payer: Medicare Other | Admitting: Family Medicine

## 2019-02-19 ENCOUNTER — Other Ambulatory Visit: Payer: Self-pay | Admitting: General Practice

## 2019-02-19 MED ORDER — THYROID 30 MG PO TABS: 30.0000 mg | ORAL_TABLET | Freq: Every day | ORAL | 0 refills | Status: DC

## 2019-03-28 ENCOUNTER — Other Ambulatory Visit: Payer: Self-pay | Admitting: General Practice

## 2019-03-28 MED ORDER — ALENDRONATE SODIUM 70 MG PO TABS: ORAL_TABLET | ORAL | 3 refills | Status: DC

## 2019-05-06 ENCOUNTER — Other Ambulatory Visit: Payer: Self-pay | Admitting: Oncology

## 2019-05-06 DIAGNOSIS — Z1231 Encounter for screening mammogram for malignant neoplasm of breast: Secondary | ICD-10-CM

## 2019-06-05 ENCOUNTER — Telehealth: Payer: Self-pay

## 2019-06-05 NOTE — Telephone Encounter (Signed)
PA began in covermymeds 

## 2019-06-05 NOTE — Telephone Encounter (Signed)
Patient came into the office 06/04/19 with paperwork stating she needs a prior authorization for her NP Thyroid 30mg  tab. Patient has received a temporary supply but they will not give her anymore. Patient can be reached at her home number.

## 2019-06-13 ENCOUNTER — Ambulatory Visit
Admission: RE | Admit: 2019-06-13 | Discharge: 2019-06-13 | Disposition: A | Discharge status: Home / Self Care | Payer: Medicare PPO | Source: Ambulatory Visit | Attending: Oncology | Admitting: Oncology

## 2019-06-13 ENCOUNTER — Other Ambulatory Visit: Payer: Self-pay

## 2019-06-13 DIAGNOSIS — Z1231 Encounter for screening mammogram for malignant neoplasm of breast: Secondary | ICD-10-CM | POA: Diagnosis not present

## 2019-06-21 ENCOUNTER — Other Ambulatory Visit: Payer: Self-pay | Admitting: *Deleted

## 2019-06-21 DIAGNOSIS — C50411 Malignant neoplasm of upper-outer quadrant of right female breast: Secondary | ICD-10-CM

## 2019-06-22 NOTE — Progress Notes (Signed)
Monica Diaz  [phone] [phone]     ID: [name] OB: 1941/04/22  MR#: [id number]  AYO#:[id number]  Patient Care Team: Midge Minium, MD as PCP - General Dohmeier, Asencion Partridge, MD as Consulting Physician (Neurology) Juanita Craver, MD as Consulting Physician (Gastroenterology) Awesome Jared, Virgie Dad, MD as Consulting Physician (Oncology) Roseanne Kaufman, MD as Consulting Physician (Orthopedic Surgery)  CHIEF COMPLAINT: Estrogen receptor positive breast cancer  CURRENT TREATMENT: Anastrozole   INTERVAL HISTORY: Shawnise returns today for follow-up of her estrogen receptor positive breast cancer.   She completed 5 years of anastrozole in 05/2018.  Off the medication she noted she had fewer hot flashes.  Since her last visit, she underwent bilateral screening mammography with tomography at [facility] on 06/13/2019 showing: breast density category C; no evidence of malignancy in either breast.   A left needle core biopsy, retroareolar, on 06/18/2018 showed only fibrosis, no evidence of malignancy.  Her most recent bone density on 06/05/2017 showed a T score of -2.0, moderate osteopenia.   REVIEW OF SYSTEMS: Haylen is not exercising as regularly as he used to because of the pandemic.  She usually goes to [employer] but because of the pandemic that is not yet safe.  She has received both her vaccine doses, the second on 05/30/2019, and incidentally she tolerated those well.  She is planning a trip to [place] to visit her daughter.  She also visits her mother in [place] at least monthly (her mother is currently 81 years old).  She wonders if her right breast is becoming a little bit softer.  She is having trouble finding a bra that is really comfortable.  Aside from these issues a detailed review of systems today was stable   BREAST CANCER HISTORY: From the original intake note:  Cherre had bilateral screening mammography at the  breast center 04/15/2013. This showed a possible mass in the right breast. Diagnostic right mammography and ultrasonography 05/09/2013 showed an irregular area of focal asymmetry in the right breast, which was not palpable. Ultrasound showed dystrophic calcification close to the area of asymmetry. The area of abnormality measured approximately 1.4 cm.  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 HER-2 was reported as negative during the multidisciplinary breast cancer conference 05/22/2013. The written report is pending.   On 05/20/2013 the patient underwent bilateral breast MRI which showed a 2.3 cm irregular enhancing mass in the right breast at the 12:00 position. There were no other areas of concern in either breast and there were no abnormal appearing lymph nodes. However incidentally a 6 mm enhancing right paramedian sternal lesion was noted.  The patient's subsequent history is as detailed below    PAST MEDICAL HISTORY: Past Medical History:  Diagnosis Date  . Arthritis   . Breast cancer ([facility]) 2015  . Cancer ([facility]) 2015   breast  . HOH (hard of hearing)   . Hx of radiation therapy 07/18/13-08/14/13   right breast total dose 50 Gy  . Hyperlipidemia   . Osteoporosis   . Personal history of radiation therapy 2015  . Seasonal allergies   . Sleep apnea    uses a cpap  . Thyroid disease   . Wears glasses     PAST SURGICAL HISTORY: Past Surgical History:  Procedure Laterality Date  .  ABDOMINAL HYSTERECTOMY     fibroids  . BREAST BIOPSY Right 2015   core   . BREAST EXCISIONAL BIOPSY Right 2005  . BREAST LUMPECTOMY Right   . COLONOSCOPY    . LEFT OOPHORECTOMY    . PARTIAL MASTECTOMY WITH NEEDLE LOCALIZATION AND AXILLARY SENTINEL LYMPH NODE BX Right 05/28/2013    Procedure: PARTIAL MASTECTOMY WITH NEEDLE LOCALIZATION AND AXILLARY SENTINEL LYMPH NODE BIOPSY;  Surgeon: Adin Hector, MD;  Location: [facility];  Service: General;  Laterality: Right;    FAMILY HISTORY Family History  Problem Relation Age of Onset  . Hypertension Mother   . Diabetes Maternal Aunt   . Stroke Other        grandfather  . Breast cancer Neg Hx   The patient knows little about her father. Her mother is living, 9 years old as of January 2015. The patient was an only child. There is no history of breast or ovarian cancer in the family   GYNECOLOGIC HISTORY:  Menarche age 86, first live birth age 57, the patient is [place] P2. 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  She is divorced and lives alone with [name], who are two nonverbal parakeets. Her daughter [name] is a housewife in [place]. Her daughter Jaidah Lomax is a college professor in [employer], teaching urban education. The patient's niece [name] MD is a neurologist working at [employer]: Not in place. The patient was given the appropriate documents at the time of her first visit here 05/22/2013 to complete and notarize at her discretion. She intends to name her daughter [name] as her healthcare power of attorney. She can be reached at [place]: Social History   Tobacco Use  . Smoking status: Never Smoker  . Smokeless tobacco: Never Used  Substance Use Topics  . Alcohol use: Yes    Comment: occasionally  . Drug use: No     Colonoscopy: 02/24/2017, normal, Dr. Collene Mares, no repeat recommended due to age  PAP:  Bone density:06/05/2017, T-score of -2.0 psteopenia  Lipid panel:  02/22/2018, elevated LDL  Allergies  Allergen Reactions  . Codeine     REACTION: sick to stomach    Current Outpatient Medications  Medication Sig Dispense Refill  . alendronate (FOSAMAX) 70 MG tablet Take with a full glass of water on an empty stomach. 12 tablet 3  . calcium citrate-vitamin D (CITRACAL+D) 315-200 MG-UNIT per tablet Take 1 tablet by mouth 2 (two) times daily.    . Coenzyme Q10 (COQ10) 200 MG CAPS Take 1 capsule by mouth at bedtime.    Marland Kitchen loratadine (CLARITIN) 10 MG tablet Take 10 mg by mouth daily as needed.     . mometasone (NASONEX) 50 MCG/ACT nasal spray Place 2 sprays into the nose daily as needed. 17 g 6  . Multiple Vitamins-Minerals (CENTRUM SILVER PO) Take 1 tablet by mouth.    . pyridoxine (B-6) 100 MG tablet Take 100 mg by mouth 2 (two) times daily.    . Red Yeast Rice 500 MG/0.5GM POWD Take by mouth.    . thyroid (NP THYROID) 30 MG tablet Take 1 tablet (30 mg total) by mouth daily. 90 tablet 0  . vitamin B-12 (CYANOCOBALAMIN) 50  MCG tablet Take 50 mcg by mouth.    . vitamin C (ASCORBIC ACID) 500 MG tablet Take 500 mg by mouth 2 (two) times daily.     No current facility-administered medications for this visit.    OBJECTIVE: Middle-aged [place] American woman who appears younger than stated age  11:   06/24/19 1302  BP: 121/83  Pulse: 79  Resp: 18  Temp: 98.2 F (36.8 C)  SpO2: 98%     Body mass index is 29.08 kg/m.    ECOG FS:1 - Symptomatic but completely ambulatory  Sclerae unicteric, EOMs intact Wearing a mask No cervical or supraclavicular adenopathy Lungs no rales or rhonchi Heart regular rate and rhythm Abd soft, nontender, positive bowel sounds MSK no focal spinal tenderness, no upper extremity lymphedema Neuro: nonfocal, well oriented, appropriate affect Breasts: The right breast is status post lumpectomy and radiation.  There is the expected skin thickening and some scar tissue laterally.  There is no evidence of local recurrence.   The left breast is benign.  Both axillae are benign.   LAB RESULTS:  CMP     Component Value Date/Time   NA 139 12/06/2018 1133   NA 139 06/16/2016 1300   K 4.2 12/06/2018 1133   K 4.1 06/16/2016 1300   CL 107 12/06/2018 1133   CO2 25 12/06/2018 1133   CO2 24 06/16/2016 1300   GLUCOSE 92 12/06/2018 1133   GLUCOSE 92 06/16/2016 1300   BUN 14 12/06/2018 1133   BUN 8.7 06/16/2016 1300   CREATININE 0.84 12/06/2018 1133   CREATININE 0.8 06/16/2016 1300   CALCIUM 9.3 12/06/2018 1133   CALCIUM 9.4 06/16/2016 1300   PROT 7.2 12/06/2018 1133   PROT 7.6 06/16/2016 1300   ALBUMIN 4.1 12/06/2018 1133   ALBUMIN 3.6 06/16/2016 1300   AST 18 12/06/2018 1133   AST 18 06/16/2016 1300   ALT 10 12/06/2018 1133   ALT 12 06/16/2016 1300   ALKPHOS 43 12/06/2018 1133   ALKPHOS 55 06/16/2016 1300   BILITOT 0.4 12/06/2018 1133   BILITOT 0.36 06/16/2016 1300   GFRNONAA >60 06/11/2018 1148   GFRAA >60 06/11/2018 1148    No results found for: SPEP  Lab Results  Component Value Date   WBC 5.2 06/24/2019   NEUTROABS 2.1 06/24/2019   HGB 11.1 (L) 06/24/2019   HCT 34.0 (L) 06/24/2019   MCV 91.4 06/24/2019   PLT 228 06/24/2019      Chemistry      Component Value Date/Time   NA 139 12/06/2018 1133   NA 139 06/16/2016 1300   K 4.2 12/06/2018 1133   K 4.1 06/16/2016 1300   CL 107 12/06/2018 1133   CO2 25 12/06/2018 1133   CO2 24 06/16/2016 1300   BUN 14 12/06/2018 1133   BUN 8.7 06/16/2016 1300   CREATININE 0.84 12/06/2018 1133   CREATININE 0.8 06/16/2016 1300      Component Value Date/Time   CALCIUM 9.3 12/06/2018 1133   CALCIUM 9.4 06/16/2016 1300   ALKPHOS 43 12/06/2018 1133   ALKPHOS 55 06/16/2016 1300   AST 18 12/06/2018 1133   AST 18 06/16/2016 1300   ALT 10 12/06/2018 1133   ALT 12 06/16/2016 1300   BILITOT 0.4 12/06/2018 1133   BILITOT 0.36 06/16/2016 1300      No results found for: LABCA2  No components found for: LABCA125  No results for input(s): INR in the  last 168 hours.  Urinalysis No results found for: COLORURINE  STUDIES: MM 3D SCREEN BREAST BILATERAL  Result Date: 06/13/2019 CLINICAL DATA:  Screening. EXAM: DIGITAL SCREENING BILATERAL MAMMOGRAM WITH TOMO AND CAD COMPARISON:  Previous exam(s). ACR Breast Density Category c: The breast tissue is heterogeneously dense, which may obscure small masses. FINDINGS: Postsurgical changes are seen in the right breast. There are no findings suspicious for malignancy. Images were processed with CAD. IMPRESSION: No mammographic evidence of malignancy. A result letter of this screening mammogram will be mailed directly to the patient. RECOMMENDATION: Screening mammogram in one year. (Code:SM-B-01Y) BI-RADS CATEGORY  2: Benign. Electronically Signed   By: Zerita Boers M.D.   On: 06/13/2019 16:20    ASSESSMENT: 79 y.o. [place] woman status post right breast biopsy 05/14/2013 for a clinical T2 N0, stage IIA invasive ductal carcinoma, grade 2 or 3, estrogen receptor 99% positive, progesterone receptor 50% positive, with an MIB-1 of 91%, and no HER-2 amplification  (1) 6 mm enhancing right sternal lesion noted by breast MRI 05/20/2013-- negative on PET 06/04/2013  (2) status post right lumpectomy and sentinel lymph node sampling to 07/12/2013 for a pT2 pN0, stage IIA invasive ductal carcinoma, mucinous, grade 2, with repeat HER-2 again negative.  (a0 left axillary lymph node biopsy 06/07/2017 benign reactive lymph node  (3) Oncotype score of 18 predicts a 10 year risk of outside the breast recurrence of 12% if the patient's only systemic treatment is tamoxifen for 5 years. It also predicts a minimal benefit from chemotherapy.  (4) adjuvant radiation completed 08/14/2013  (5) anastrozole started 09/06/2013, to be discontinued 09/12/2018  (a) repeat bone density scan 04/13/2014 showed osteopenia with a T score of -1.4  (b) bone density on 06/05/2017 showed a T score of -2.0  osteopenia   PLAN: Torria is now 6 years out from definitive surgery for her breast cancer with no evidence of disease recurrence.  This is very favorable.  She is being very reasonable about the vaccines, having received both doses but still maintaining distance and using a mask.  I am hopeful within 3 months or so it will be safe enough for her and all of [place] to get back to the gym.  She wondered if there is a better type of mammography for her dense breasts.  Of course we are doing the "3D" tomography.  I would not recommend yearly MRI in her situation as I think she would be plagued with falls positives.  I do think as time goes on her breast density will continue to decline.  Otherwise she will return to see me in 1 year.  She knows to call for any other issues that may develop before the next year.  Total encounter time 25 minutes.*  Miarose Lippert, Virgie Dad, MD  06/24/19 1:22 PM Medical Oncology and Hematology [facility] [place], [place] [address] Tel. [phone]    Fax. [phone]   I, Wilburn Mylar, am acting as scribe for Dr. Virgie Dad. Brinn Westby.  I, Lurline Del MD, have reviewed the above documentation for accuracy and completeness, and I agree with the above.   *Total Encounter Time as defined by the Centers for Medicare and Medicaid Services includes, in addition to the face-to-face time of a patient visit (documented in the note above) non-face-to-face time: obtaining and reviewing outside history, ordering and reviewing medications, tests or procedures, care coordination (communications with other health care professionals or caregivers) and documentation in the medical record.

## 2019-06-24 ENCOUNTER — Encounter: Payer: Self-pay | Admitting: Oncology

## 2019-06-24 ENCOUNTER — Inpatient Hospital Stay: Payer: Medicare PPO | Attending: Oncology | Admitting: Oncology

## 2019-06-24 ENCOUNTER — Other Ambulatory Visit: Payer: Self-pay

## 2019-06-24 ENCOUNTER — Inpatient Hospital Stay: Payer: Medicare PPO

## 2019-06-24 VITALS — BP 121/83 | HR 79 | Temp 98.2°F | Resp 18 | Ht 64.0 in | Wt 169.4 lb

## 2019-06-24 DIAGNOSIS — D649 Anemia, unspecified: Secondary | ICD-10-CM | POA: Insufficient documentation

## 2019-06-24 DIAGNOSIS — C50811 Malignant neoplasm of overlapping sites of right female breast: Secondary | ICD-10-CM | POA: Insufficient documentation

## 2019-06-24 DIAGNOSIS — Z833 Family history of diabetes mellitus: Secondary | ICD-10-CM | POA: Insufficient documentation

## 2019-06-24 DIAGNOSIS — Z885 Allergy status to narcotic agent status: Secondary | ICD-10-CM | POA: Insufficient documentation

## 2019-06-24 DIAGNOSIS — R232 Flushing: Secondary | ICD-10-CM | POA: Diagnosis not present

## 2019-06-24 DIAGNOSIS — M8000XG Age-related osteoporosis with current pathological fracture, unspecified site, subsequent encounter for fracture with delayed healing: Secondary | ICD-10-CM | POA: Diagnosis not present

## 2019-06-24 DIAGNOSIS — Z170 Estrogen receptor positive status [ER+]: Secondary | ICD-10-CM | POA: Insufficient documentation

## 2019-06-24 DIAGNOSIS — Z79899 Other long term (current) drug therapy: Secondary | ICD-10-CM | POA: Diagnosis not present

## 2019-06-24 DIAGNOSIS — Z90721 Acquired absence of ovaries, unilateral: Secondary | ICD-10-CM | POA: Insufficient documentation

## 2019-06-24 DIAGNOSIS — Z923 Personal history of irradiation: Secondary | ICD-10-CM | POA: Insufficient documentation

## 2019-06-24 DIAGNOSIS — Z8249 Family history of ischemic heart disease and other diseases of the circulatory system: Secondary | ICD-10-CM | POA: Diagnosis not present

## 2019-06-24 DIAGNOSIS — C50411 Malignant neoplasm of upper-outer quadrant of right female breast: Secondary | ICD-10-CM

## 2019-06-24 DIAGNOSIS — Z823 Family history of stroke: Secondary | ICD-10-CM | POA: Diagnosis not present

## 2019-06-24 DIAGNOSIS — M8580 Other specified disorders of bone density and structure, unspecified site: Secondary | ICD-10-CM | POA: Diagnosis not present

## 2019-06-24 DIAGNOSIS — N6032 Fibrosclerosis of left breast: Secondary | ICD-10-CM | POA: Insufficient documentation

## 2019-06-24 DIAGNOSIS — E785 Hyperlipidemia, unspecified: Secondary | ICD-10-CM | POA: Diagnosis not present

## 2019-06-24 LAB — CBC WITH DIFFERENTIAL (CANCER CENTER ONLY)
Abs Immature Granulocytes: 0.03 10*3/uL (ref 0.00–0.07)
Basophils Absolute: 0 10*3/uL (ref 0.0–0.1)
Basophils Relative: 0 %
Eosinophils Absolute: 0.1 10*3/uL (ref 0.0–0.5)
Eosinophils Relative: 1 %
HCT: 34 % — ABNORMAL LOW (ref 36.0–46.0)
Hemoglobin: 11.1 g/dL — ABNORMAL LOW (ref 12.0–15.0)
Immature Granulocytes: 1 %
Lymphocytes Relative: 43 %
Lymphs Abs: 2.3 10*3/uL (ref 0.7–4.0)
MCH: 29.8 pg (ref 26.0–34.0)
MCHC: 32.6 g/dL (ref 30.0–36.0)
MCV: 91.4 fL (ref 80.0–100.0)
Monocytes Absolute: 0.7 10*3/uL (ref 0.1–1.0)
Monocytes Relative: 14 %
Neutro Abs: 2.1 10*3/uL (ref 1.7–7.7)
Neutrophils Relative %: 41 %
Platelet Count: 228 10*3/uL (ref 150–400)
RBC: 3.72 MIL/uL — ABNORMAL LOW (ref 3.87–5.11)
RDW: 14.2 % (ref 11.5–15.5)
WBC Count: 5.2 10*3/uL (ref 4.0–10.5)
nRBC: 0 % (ref 0.0–0.2)

## 2019-06-24 LAB — CMP (CANCER CENTER ONLY)
ALT: 11 U/L (ref 0–44)
AST: 20 U/L (ref 15–41)
Albumin: 3.8 g/dL (ref 3.5–5.0)
Alkaline Phosphatase: 45 U/L (ref 38–126)
Anion gap: 7 (ref 5–15)
BUN: 12 mg/dL (ref 8–23)
CO2: 27 mmol/L (ref 22–32)
Calcium: 9.1 mg/dL (ref 8.9–10.3)
Chloride: 107 mmol/L (ref 98–111)
Creatinine: 1 mg/dL (ref 0.44–1.00)
GFR, Est AFR Am: >60 mL/min (ref >60)
GFR, Estimated: 54 mL/min — ABNORMAL LOW (ref >60)
Glucose, Bld: 82 mg/dL (ref 70–99)
Potassium: 4 mmol/L (ref 3.5–5.1)
Sodium: 141 mmol/L (ref 135–145)
Total Bilirubin: 0.4 mg/dL (ref 0.3–1.2)
Total Protein: 7.5 g/dL (ref 6.5–8.1)

## 2019-06-24 LAB — IRON AND TIBC
Iron: 71 ug/dL (ref 41–142)
Saturation Ratios: 23 % (ref 21–57)
TIBC: 313 ug/dL (ref 236–444)
UIBC: 241 ug/dL (ref 120–384)

## 2019-06-24 LAB — FERRITIN: Ferritin: 19 ng/mL (ref 11–307)

## 2019-06-24 LAB — RETICULOCYTES
Immature Retic Fract: 9.6 % (ref 2.3–15.9)
RBC.: 3.77 MIL/uL — ABNORMAL LOW (ref 3.87–5.11)
Retic Count, Absolute: 44.9 10*3/uL (ref 19.0–186.0)
Retic Ct Pct: 1.2 % (ref 0.4–3.1)

## 2019-06-24 LAB — FOLATE: Folate: 17.9 ng/mL (ref >5.9)

## 2019-06-24 LAB — SAVE SMEAR(SSMR), FOR PROVIDER SLIDE REVIEW

## 2019-06-24 LAB — VITAMIN B12: Vitamin B-12: 211 pg/mL (ref 180–914)

## 2019-06-24 LAB — VITAMIN D 25 HYDROXY (VIT D DEFICIENCY, FRACTURES): Vit D, 25-Hydroxy: 44.5 ng/mL (ref 30–100)

## 2019-06-25 ENCOUNTER — Telehealth: Payer: Self-pay | Admitting: Oncology

## 2019-06-25 NOTE — Telephone Encounter (Signed)
I left a message regarding schedule  

## 2019-08-14 ENCOUNTER — Other Ambulatory Visit: Payer: Self-pay | Admitting: Family Medicine

## 2019-09-25 ENCOUNTER — Telehealth: Payer: Self-pay | Admitting: Family Medicine

## 2019-09-25 NOTE — Telephone Encounter (Signed)
Left message for patient to schedule Annual Wellness Visit.  Please schedule with Nurse Health Advisor Martha Stanley, RN at [facility]  

## 2019-09-25 NOTE — Telephone Encounter (Signed)
Please call patient to schedule Annual Wellness Visit - We do not know when the [facility] Coach/Nurse will be here.  Patient returned your call.

## 2019-11-24 ENCOUNTER — Other Ambulatory Visit: Payer: Self-pay | Admitting: Family Medicine

## 2019-12-09 ENCOUNTER — Other Ambulatory Visit: Payer: Self-pay

## 2019-12-09 ENCOUNTER — Ambulatory Visit (INDEPENDENT_AMBULATORY_CARE_PROVIDER_SITE_OTHER): Payer: Medicare PPO | Admitting: Family Medicine

## 2019-12-09 ENCOUNTER — Encounter: Payer: Self-pay | Admitting: Family Medicine

## 2019-12-09 VITALS — BP 120/80 | HR 78 | Temp 98.0°F | Resp 16 | Ht 64.0 in | Wt 166.5 lb

## 2019-12-09 DIAGNOSIS — E559 Vitamin D deficiency, unspecified: Secondary | ICD-10-CM | POA: Diagnosis not present

## 2019-12-09 DIAGNOSIS — Z0000 Encounter for general adult medical examination without abnormal findings: Secondary | ICD-10-CM | POA: Diagnosis not present

## 2019-12-09 DIAGNOSIS — E785 Hyperlipidemia, unspecified: Secondary | ICD-10-CM

## 2019-12-09 LAB — LIPID PANEL
Cholesterol: 214 mg/dL — ABNORMAL HIGH (ref 0–200)
HDL: 68.6 mg/dL (ref >39.00)
LDL Cholesterol: 128 mg/dL — ABNORMAL HIGH (ref 0–99)
NonHDL: 145.75
Total CHOL/HDL Ratio: 3
Triglycerides: 89 mg/dL (ref 0.0–149.0)
VLDL: 17.8 mg/dL (ref 0.0–40.0)

## 2019-12-09 LAB — CBC WITH DIFFERENTIAL/PLATELET
Basophils Absolute: 0 10*3/uL (ref 0.0–0.1)
Basophils Relative: 0.2 % (ref 0.0–3.0)
Eosinophils Absolute: 0.1 10*3/uL (ref 0.0–0.7)
Eosinophils Relative: 1.2 % (ref 0.0–5.0)
HCT: 36 % (ref 36.0–46.0)
Hemoglobin: 12 g/dL (ref 12.0–15.0)
Lymphocytes Relative: 32.7 % (ref 12.0–46.0)
Lymphs Abs: 1.4 10*3/uL (ref 0.7–4.0)
MCHC: 33.4 g/dL (ref 30.0–36.0)
MCV: 91.7 fl (ref 78.0–100.0)
Monocytes Absolute: 0.5 10*3/uL (ref 0.1–1.0)
Monocytes Relative: 11 % (ref 3.0–12.0)
Neutro Abs: 2.3 10*3/uL (ref 1.4–7.7)
Neutrophils Relative %: 54.9 % (ref 43.0–77.0)
Platelets: 253 10*3/uL (ref 150.0–400.0)
RBC: 3.93 Mil/uL (ref 3.87–5.11)
RDW: 15.5 % (ref 11.5–15.5)
WBC: 4.3 10*3/uL (ref 4.0–10.5)

## 2019-12-09 LAB — BASIC METABOLIC PANEL
BUN: 14 mg/dL (ref 6–23)
CO2: 26 mEq/L (ref 19–32)
Calcium: 9.2 mg/dL (ref 8.4–10.5)
Chloride: 106 mEq/L (ref 96–112)
Creatinine, Ser: 1 mg/dL (ref 0.40–1.20)
GFR: 64.96 mL/min (ref >60.00)
Glucose, Bld: 89 mg/dL (ref 70–99)
Potassium: 3.9 mEq/L (ref 3.5–5.1)
Sodium: 140 mEq/L (ref 135–145)

## 2019-12-09 LAB — HEPATIC FUNCTION PANEL
ALT: 11 U/L (ref 0–35)
AST: 16 U/L (ref 0–37)
Albumin: 4 g/dL (ref 3.5–5.2)
Alkaline Phosphatase: 42 U/L (ref 39–117)
Bilirubin, Direct: 0.1 mg/dL (ref 0.0–0.3)
Total Bilirubin: 0.5 mg/dL (ref 0.2–1.2)
Total Protein: 7.2 g/dL (ref 6.0–8.3)

## 2019-12-09 LAB — TSH: TSH: 6.21 u[IU]/mL — ABNORMAL HIGH (ref 0.35–4.50)

## 2019-12-09 LAB — VITAMIN D 25 HYDROXY (VIT D DEFICIENCY, FRACTURES): VITD: 64.4 ng/mL (ref 30.00–100.00)

## 2019-12-09 NOTE — Assessment & Plan Note (Signed)
Check labs and replete prn. 

## 2019-12-09 NOTE — Progress Notes (Signed)
   Subjective:    Patient ID: Monica Diaz, female    DOB: October 23, 1940, 79 y.o.   MRN: [id number]  HPI CPE- UTD on mammo, colonoscopy, vaccines.  Reviewed past medical, surgical, family and social histories.   Health Maintenance  Topic Date Due  . INFLUENZA VACCINE  11/24/2019  . Hepatitis C Screening  12/08/2020 (Originally [birth year])  . MAMMOGRAM  06/12/2020  . TETANUS/TDAP  03/18/2024  . DEXA SCAN  Completed  . COVID-19 Vaccine  Completed  . PNA vac Low Risk Adult  Completed     Patient Care Team    Relationship Specialty Notifications Start End  Midge Minium, MD PCP - General   04/07/10    Comment: Jones Skene, Asencion Partridge, MD Consulting Physician Neurology  05/22/13   Juanita Craver, MD Consulting Physician Gastroenterology  08/27/15   Magrinat, Virgie Dad, MD Consulting Physician Oncology  09/14/16   Roseanne Kaufman, MD Consulting Physician Orthopedic Surgery  09/14/16       Review of Systems Patient reports no vision/ hearing changes, adenopathy,fever, weight change,  persistant/recurrent hoarseness , swallowing issues, chest pain, palpitations, edema, persistant/recurrent cough, hemoptysis, dyspnea (rest/exertional/paroxysmal nocturnal), gastrointestinal bleeding (melena, rectal bleeding), abdominal pain, significant heartburn, bowel changes, GU symptoms (dysuria, hematuria, incontinence), Gyn symptoms (abnormal  bleeding, pain),  syncope, focal weakness, memory loss, skin/hair/nail changes, abnormal bruising or bleeding, anxiety, or depression.   + neuropathy of both feet  This visit occurred during the SARS-CoV-2 public health emergency.  Safety protocols were in place, including screening questions prior to the visit, additional usage of staff PPE, and extensive cleaning of exam room while observing appropriate contact time as indicated for disinfecting solutions.       Objective:   Physical Exam General Appearance:    Alert, cooperative, no distress, appears  stated age  Head:    Normocephalic, without obvious abnormality, atraumatic  Eyes:    PERRL, conjunctiva/corneas clear, EOM's intact, fundi    benign, both eyes  Ears:    Normal TM's and external ear canals, both ears  Nose:     Throat:   Neck:   Supple, symmetrical, trachea midline, no adenopathy;    Thyroid: no enlargement/tenderness/nodules  Back:     Symmetric, no curvature, ROM normal, no CVA tenderness  Lungs:     Clear to auscultation bilaterally, respirations unlabored  Chest Wall:    No tenderness or deformity   Heart:    Regular rate and rhythm, S1 and S2 normal, no murmur, rub   or gallop  Breast Exam:    Deferred to mammo  Abdomen:     Soft, non-tender, bowel sounds active all four quadrants,    no masses, no organomegaly  Genitalia:    Deferred  Rectal:    Extremities:   Extremities normal, atraumatic, no cyanosis or edema  Pulses:   2+ and symmetric all extremities  Skin:   Skin color, texture, turgor normal, no rashes or lesions  Lymph nodes:   Cervical, supraclavicular, and axillary nodes normal  Neurologic:   CNII-XII intact, normal strength, sensation and reflexes    throughout          Assessment & Plan:

## 2019-12-09 NOTE — Patient Instructions (Addendum)
Follow up in 1 year or as needed We'll notify you of your lab results and make any changes if needed Continue to work on healthy diet and regular exercise- you can do it! Call with any questions or concerns Stay Safe!  Stay Healthy! 

## 2019-12-09 NOTE — Assessment & Plan Note (Signed)
Pt's PE WNL.  UTD on colonoscopy, mammo, DEXA, immunizations.  Check labs.  Anticipatory guidance provided.  

## 2019-12-09 NOTE — Assessment & Plan Note (Signed)
Chronic problem.  Last LDL 152.  Currently on Red Yeast Rice.  Check labs.  Adjust meds prn

## 2019-12-10 ENCOUNTER — Other Ambulatory Visit: Payer: Self-pay | Admitting: General Practice

## 2019-12-10 DIAGNOSIS — E039 Hypothyroidism, unspecified: Secondary | ICD-10-CM

## 2019-12-10 MED ORDER — THYROID 60 MG PO TABS: 60.0000 mg | ORAL_TABLET | Freq: Every day | ORAL | 6 refills | Status: DC

## 2020-01-14 ENCOUNTER — Ambulatory Visit: Payer: Medicare PPO

## 2020-01-28 ENCOUNTER — Ambulatory Visit: Payer: Medicare PPO

## 2020-01-29 ENCOUNTER — Ambulatory Visit (INDEPENDENT_AMBULATORY_CARE_PROVIDER_SITE_OTHER): Payer: Medicare PPO

## 2020-01-29 ENCOUNTER — Other Ambulatory Visit: Payer: Self-pay

## 2020-01-29 DIAGNOSIS — E039 Hypothyroidism, unspecified: Secondary | ICD-10-CM | POA: Diagnosis not present

## 2020-01-29 LAB — TSH: TSH: 2.18 u[IU]/mL (ref 0.35–4.50)

## 2020-01-30 ENCOUNTER — Encounter: Payer: Self-pay | Admitting: General Practice

## 2020-03-03 ENCOUNTER — Telehealth: Payer: Self-pay | Admitting: Family Medicine

## 2020-03-03 NOTE — Telephone Encounter (Signed)
Patient brought in a release form for physical activity - She had her physical in August and she's hoping that it can be filled out from that.  Form placed in Dr. Virgil Benedict bin up front - sa

## 2020-03-03 NOTE — Telephone Encounter (Signed)
Noted  

## 2020-03-30 ENCOUNTER — Other Ambulatory Visit: Payer: Self-pay

## 2020-03-30 DIAGNOSIS — M8000XG Age-related osteoporosis with current pathological fracture, unspecified site, subsequent encounter for fracture with delayed healing: Secondary | ICD-10-CM

## 2020-03-30 MED ORDER — ALENDRONATE SODIUM 70 MG PO TABS: ORAL_TABLET | ORAL | 0 refills | Status: DC

## 2020-03-30 NOTE — Telephone Encounter (Signed)
Pt has been made aware that the form is done.

## 2020-05-01 ENCOUNTER — Other Ambulatory Visit: Payer: Self-pay | Admitting: Oncology

## 2020-05-01 DIAGNOSIS — Z1231 Encounter for screening mammogram for malignant neoplasm of breast: Secondary | ICD-10-CM

## 2020-05-15 ENCOUNTER — Telehealth: Payer: Self-pay | Admitting: Oncology

## 2020-05-15 NOTE — Telephone Encounter (Signed)
Rescheduled upcoming appointment per 1/21 schedule message. Patient is aware of changes. 

## 2020-06-11 ENCOUNTER — Inpatient Hospital Stay: Admission: RE | Admit: 2020-06-11 | Payer: Medicare PPO | Source: Ambulatory Visit

## 2020-06-15 ENCOUNTER — Ambulatory Visit
Admission: RE | Admit: 2020-06-15 | Discharge: 2020-06-15 | Disposition: A | Discharge status: Home / Self Care | Payer: Medicare PPO | Source: Ambulatory Visit | Attending: Oncology | Admitting: Oncology

## 2020-06-15 ENCOUNTER — Other Ambulatory Visit: Payer: Self-pay

## 2020-06-15 DIAGNOSIS — Z1231 Encounter for screening mammogram for malignant neoplasm of breast: Secondary | ICD-10-CM

## 2020-06-15 NOTE — Progress Notes (Signed)
[place]  [phone] [phone]     ID: [name] OB: 22-Mar-1941  MR#: [id number]  CWU#:[id number]  Patient Care Team: Midge Minium, MD as PCP - General Dohmeier, Asencion Partridge, MD as Consulting Physician (Neurology) Juanita Craver, MD as Consulting Physician (Gastroenterology) Yosgar Demirjian, Virgie Dad, MD as Consulting Physician (Oncology) Roseanne Kaufman, MD as Consulting Physician (Orthopedic Surgery)  CHIEF COMPLAINT: Estrogen receptor positive breast cancer  CURRENT TREATMENT: observation   INTERVAL HISTORY: Damariz returns today for follow-up of her estrogen receptor positive breast cancer. She continues under observation.  Since her last visit, she underwent bilateral screening mammography with tomography at [facility] yesterday, 06/15/2020, but the report is still pending   REVIEW OF SYSTEMS: Jaleeya is generally doing fine.  [facility].  She has developed a little discomfort in her right deltoid and right shoulder.  This is very inconstant.  She thinks she may have sprained it while walking her daughter's dog, which is poorly trained.  She does have a good range of motion on that side and no swelling.  A detailed review of systems today was otherwise stable   COVID 19 VACCINATION STATUS: [place] x2, most recently 05/2019   BREAST CANCER HISTORY: From the original intake note:  Annaliza had bilateral screening mammography at the breast center 04/15/2013. This showed a possible mass in the right breast. Diagnostic right mammography and ultrasonography 05/09/2013 showed an irregular area of focal asymmetry in the right breast, which was not palpable. Ultrasound showed dystrophic calcification close to the area of asymmetry. The area of abnormality measured approximately 1.4 cm.  Biopsy of the right breast mass 05/14/2013 showed ([id number]) and invasive ductal carcinoma with  extracellular mucin, grade 2 or 3 a month estrogen receptor 99% positive, progesterone receptor 50% positive, both with strong staining intensity, and an MIB-1 of 91%. HER-2 was reported as negative during the multidisciplinary breast cancer conference 05/22/2013. The written report is pending.   On 05/20/2013 the patient underwent bilateral breast MRI which showed a 2.3 cm irregular enhancing mass in the right breast at the 12:00 position. There were no other areas of concern in either breast and there were no abnormal appearing lymph nodes. However incidentally a 6 mm enhancing right paramedian sternal lesion was noted.  The patient's subsequent history is as detailed below    PAST MEDICAL HISTORY: Past Medical History:  Diagnosis Date   Arthritis    Breast cancer ([place]) 2015   Cancer ([facility]) 2015   breast   HOH (hard of hearing)    Hx of radiation therapy 07/18/13-08/14/13   right breast total dose 50 Gy   Hyperlipidemia    Osteoporosis    Personal history of radiation therapy 2015   Seasonal allergies    Sleep apnea    uses a cpap   Thyroid disease    Wears glasses     PAST SURGICAL HISTORY: Past Surgical History:  Procedure Laterality Date   ABDOMINAL HYSTERECTOMY     fibroids   BREAST BIOPSY Right 2015   core    BREAST BIOPSY Left 06/18/2018   DENSE FIBROSIS WITH CALCIFICATIONS AND CHRONIC   BREAST BIOPSY Left 06/07/2017   BREAST EXCISIONAL BIOPSY Right 2005   BREAST LUMPECTOMY Right    COLONOSCOPY     LEFT OOPHORECTOMY     PARTIAL MASTECTOMY WITH NEEDLE LOCALIZATION AND AXILLARY SENTINEL LYMPH NODE BX Right 05/28/2013   Procedure:  PARTIAL MASTECTOMY WITH NEEDLE LOCALIZATION AND AXILLARY SENTINEL LYMPH NODE BIOPSY;  Surgeon: Adin Hector, MD;  Location: [facility];  Service: General;  Laterality: Right;    FAMILY HISTORY Family History  Problem Relation Age of Onset   Hypertension Mother    Diabetes Maternal Aunt    Stroke  Other        grandfather   Breast cancer Neg Hx   The patient knows little about her father. Her mother is living, 41 years old as of January 2015. The patient was an only child. There is no history of breast or ovarian cancer in the family   GYNECOLOGIC HISTORY:  Menarche age 34, first live birth age 69, the patient is [place] P2. 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  She is divorced and lives alone with [name], who are two nonverbal parakeets. Her daughter [name] is a housewife in [place]. Her daughter Michaeline Eckersley is a college professor in [employer], teaching urban education. The patient's niece [name] MD is a neurologist working at [employer]: Not in place. The patient was given the appropriate documents at the time of her first visit here 05/22/2013 to complete and notarize at her discretion. She intends to name her daughter [name] as her healthcare power of attorney. She can be reached at [place]: Social History   Tobacco Use   Smoking status: Never Smoker   Smokeless tobacco: Never Used  Vaping Use   Vaping Use: Never used  Substance Use Topics   Alcohol use: Yes    Comment: occasionally   Drug use: No     Colonoscopy: 02/24/2017, normal, Dr. Collene Mares, no repeat recommended due to age  PAP:  Bone density:06/05/2017, T-score of -2.0 psteopenia  Lipid panel: 02/22/2018, elevated LDL  Allergies  Allergen Reactions   Codeine     REACTION: sick to stomach    Current Outpatient Medications  Medication Sig Dispense Refill   alendronate (FOSAMAX) 70 MG tablet Take with a full glass of water on an empty stomach. 12 tablet 0   calcium  citrate-vitamin D (CITRACAL+D) 315-200 MG-UNIT per tablet Take 1 tablet by mouth 2 (two) times daily.     Coenzyme Q10 (COQ10) 200 MG CAPS Take 1 capsule by mouth at bedtime.     loratadine (CLARITIN) 10 MG tablet Take 10 mg by mouth daily as needed.      mometasone (NASONEX) 50 MCG/ACT nasal spray Place 2 sprays into the nose daily as needed. 17 g 6   Multiple Vitamins-Minerals (CENTRUM SILVER PO) Take 1 tablet by mouth.     pyridoxine (B-6) 100 MG tablet Take 100 mg by mouth 2 (two) times daily. (Patient not taking: Reported on 12/09/2019)     Red Yeast Rice 500 MG/0.5GM POWD Take by mouth.     thyroid (ARMOUR THYROID) 60 MG tablet Take 1 tablet (60 mg total) by mouth daily before breakfast. 30 tablet 6   vitamin B-12 (CYANOCOBALAMIN) 50 MCG tablet Take 50 mcg by mouth. (Patient not taking: Reported on 12/09/2019)     vitamin C (ASCORBIC ACID) 500 MG tablet Take 500 mg by mouth 2 (two) times daily.     No current facility-administered medications for  this visit.    OBJECTIVE: African American woman who appears younger than stated age  34:   06/16/20 1408  BP: 133/77  Pulse: 71  Resp: 18  Temp: 97.7 F (36.5 C)  SpO2: 97%     Body mass index is 28.51 kg/m.    ECOG FS:1 - Symptomatic but completely ambulatory  Sclerae unicteric, EOMs intact Wearing a mask No cervical or supraclavicular adenopathy Lungs no rales or rhonchi Heart regular rate and rhythm Abd soft, nontender, positive bowel sounds MSK no focal spinal tenderness, no upper extremity lymphedema Neuro: nonfocal, well oriented, appropriate affect Breasts: The right breast has undergone lumpectomy and radiation.  There is some skin thickening and scar tissue laterally as expected.  There is no evidence of local recurrence.  Left breast and both axilla are unremarkable   LAB RESULTS:  CMP     Component Value Date/Time   NA 137 06/16/2020 1307   NA 139 06/16/2016 1300   K 4.0 06/16/2020 1307   K 4.1  06/16/2016 1300   CL 106 06/16/2020 1307   CO2 25 06/16/2020 1307   CO2 24 06/16/2016 1300   GLUCOSE 64 (L) 06/16/2020 1307   GLUCOSE 92 06/16/2016 1300   BUN 9 06/16/2020 1307   BUN 8.7 06/16/2016 1300   CREATININE 1.03 (H) 06/16/2020 1307   CREATININE 1.00 06/24/2019 1242   CREATININE 0.8 06/16/2016 1300   CALCIUM 8.9 06/16/2020 1307   CALCIUM 9.4 06/16/2016 1300   PROT 7.5 06/16/2020 1307   PROT 7.6 06/16/2016 1300   ALBUMIN 3.9 06/16/2020 1307   ALBUMIN 3.6 06/16/2016 1300   AST 22 06/16/2020 1307   AST 20 06/24/2019 1242   AST 18 06/16/2016 1300   ALT 17 06/16/2020 1307   ALT 11 06/24/2019 1242   ALT 12 06/16/2016 1300   ALKPHOS 41 06/16/2020 1307   ALKPHOS 55 06/16/2016 1300   BILITOT 0.3 06/16/2020 1307   BILITOT 0.4 06/24/2019 1242   BILITOT 0.36 06/16/2016 1300   GFRNONAA 56 (L) 06/16/2020 1307   GFRNONAA 54 (L) 06/24/2019 1242   GFRAA >60 06/24/2019 1242    No results found for: SPEP  Lab Results  Component Value Date   WBC 6.0 06/16/2020   NEUTROABS 2.5 06/16/2020   HGB 11.5 (L) 06/16/2020   HCT 36.0 06/16/2020   MCV 93.3 06/16/2020   PLT 248 06/16/2020      Chemistry      Component Value Date/Time   NA 137 06/16/2020 1307   NA 139 06/16/2016 1300   K 4.0 06/16/2020 1307   K 4.1 06/16/2016 1300   CL 106 06/16/2020 1307   CO2 25 06/16/2020 1307   CO2 24 06/16/2016 1300   BUN 9 06/16/2020 1307   BUN 8.7 06/16/2016 1300   CREATININE 1.03 (H) 06/16/2020 1307   CREATININE 1.00 06/24/2019 1242   CREATININE 0.8 06/16/2016 1300      Component Value Date/Time   CALCIUM 8.9 06/16/2020 1307   CALCIUM 9.4 06/16/2016 1300   ALKPHOS 41 06/16/2020 1307   ALKPHOS 55 06/16/2016 1300   AST 22 06/16/2020 1307   AST 20 06/24/2019 1242   AST 18 06/16/2016 1300   ALT 17 06/16/2020 1307   ALT 11 06/24/2019 1242   ALT 12 06/16/2016 1300   BILITOT 0.3 06/16/2020 1307   BILITOT 0.4 06/24/2019 1242   BILITOT 0.36 06/16/2016 1300      No results found for:  LABCA2  No components found for: [id number]  No  results for input(s): INR in the last 168 hours.  Urinalysis No results found for: COLORURINE   STUDIES: No results found.   ASSESSMENT: 80 y.o. [place] woman status post right breast biopsy 05/14/2013 for a clinical T2 N0, stage IIA invasive ductal carcinoma, grade 2 or 3, estrogen receptor 99% positive, progesterone receptor 50% positive, with an MIB-1 of 91%, and no HER-2 amplification  (1) 6 mm enhancing right sternal lesion noted by breast MRI 05/20/2013-- negative on PET 06/04/2013  (2) status post right lumpectomy and sentinel lymph node sampling to 07/12/2013 for a pT2 pN0, stage IIA invasive ductal carcinoma, mucinous, grade 2, with repeat HER-2 again negative.  (a0 left axillary lymph node biopsy 06/07/2017 benign reactive lymph node  (3) Oncotype score of 18 predicts a 10 year risk of outside the breast recurrence of 12% if the patient's only systemic treatment is tamoxifen for 5 years. It also predicts a minimal benefit from chemotherapy.  (4) adjuvant radiation completed 08/14/2013  (5) anastrozole started 09/06/2013, to be discontinued 09/12/2018  (a) repeat bone density scan 04/13/2014 showed osteopenia with a T score of -1.4  (b) bone density on 06/05/2017 showed a T score of -2.0 osteopenia   PLAN: Marquel is now 7 years out from definitive surgery for her breast cancer with no evidence of disease recurrence.  This is very favorable.  I offered her a "graduation" from follow-up here since she has good primary care through Dr. Birdie Riddle.  Nevertheless Kandance enjoys coming here, feels reassured by the visits, and always has a symptom or 2 to report that she needs discussed.  Accordingly we are making a return appointment for her here in 1 year  She knows to call for any other issue that may develop before the next visit  Total encounter time 25 minutes.   Brayn Eckstein, Virgie Dad, MD  06/16/20 5:24 PM Medical  Oncology and Hematology [facility] [place], [place] [address] Tel. [phone]    Fax. [phone]   I, Wilburn Mylar, am acting as scribe for Dr. Virgie Dad. Mikhayla Phillis.  I, Lurline Del MD, have reviewed the above documentation for accuracy and completeness, and I agree with the above.   *Total Encounter Time as defined by the Centers for Medicare and Medicaid Services includes, in addition to the face-to-face time of a patient visit (documented in the note above) non-face-to-face time: obtaining and reviewing outside history, ordering and reviewing medications, tests or procedures, care coordination (communications with other health care professionals or caregivers) and documentation in the medical record.

## 2020-06-16 ENCOUNTER — Other Ambulatory Visit: Payer: Self-pay

## 2020-06-16 ENCOUNTER — Inpatient Hospital Stay: Payer: Medicare PPO | Attending: Oncology

## 2020-06-16 ENCOUNTER — Inpatient Hospital Stay: Payer: Medicare PPO | Admitting: Oncology

## 2020-06-16 VITALS — BP 133/77 | HR 71 | Temp 97.7°F | Resp 18 | Ht 64.0 in | Wt 166.1 lb

## 2020-06-16 DIAGNOSIS — Z90710 Acquired absence of both cervix and uterus: Secondary | ICD-10-CM | POA: Diagnosis not present

## 2020-06-16 DIAGNOSIS — C50411 Malignant neoplasm of upper-outer quadrant of right female breast: Secondary | ICD-10-CM | POA: Diagnosis not present

## 2020-06-16 DIAGNOSIS — M8000XG Age-related osteoporosis with current pathological fracture, unspecified site, subsequent encounter for fracture with delayed healing: Secondary | ICD-10-CM

## 2020-06-16 DIAGNOSIS — Z170 Estrogen receptor positive status [ER+]: Secondary | ICD-10-CM | POA: Diagnosis not present

## 2020-06-16 DIAGNOSIS — Z90721 Acquired absence of ovaries, unilateral: Secondary | ICD-10-CM | POA: Insufficient documentation

## 2020-06-16 DIAGNOSIS — Z923 Personal history of irradiation: Secondary | ICD-10-CM | POA: Insufficient documentation

## 2020-06-16 DIAGNOSIS — Z853 Personal history of malignant neoplasm of breast: Secondary | ICD-10-CM | POA: Diagnosis not present

## 2020-06-16 DIAGNOSIS — E079 Disorder of thyroid, unspecified: Secondary | ICD-10-CM | POA: Diagnosis not present

## 2020-06-16 DIAGNOSIS — M818 Other osteoporosis without current pathological fracture: Secondary | ICD-10-CM | POA: Diagnosis not present

## 2020-06-16 DIAGNOSIS — G4730 Sleep apnea, unspecified: Secondary | ICD-10-CM | POA: Diagnosis not present

## 2020-06-16 DIAGNOSIS — E785 Hyperlipidemia, unspecified: Secondary | ICD-10-CM | POA: Diagnosis not present

## 2020-06-16 DIAGNOSIS — D649 Anemia, unspecified: Secondary | ICD-10-CM

## 2020-06-16 LAB — COMPREHENSIVE METABOLIC PANEL
ALT: 17 U/L (ref 0–44)
AST: 22 U/L (ref 15–41)
Albumin: 3.9 g/dL (ref 3.5–5.0)
Alkaline Phosphatase: 41 U/L (ref 38–126)
Anion gap: 6 (ref 5–15)
BUN: 9 mg/dL (ref 8–23)
CO2: 25 mmol/L (ref 22–32)
Calcium: 8.9 mg/dL (ref 8.9–10.3)
Chloride: 106 mmol/L (ref 98–111)
Creatinine, Ser: 1.03 mg/dL — ABNORMAL HIGH (ref 0.44–1.00)
GFR, Estimated: 56 mL/min — ABNORMAL LOW (ref >60)
Glucose, Bld: 64 mg/dL — ABNORMAL LOW (ref 70–99)
Potassium: 4 mmol/L (ref 3.5–5.1)
Sodium: 137 mmol/L (ref 135–145)
Total Bilirubin: 0.3 mg/dL (ref 0.3–1.2)
Total Protein: 7.5 g/dL (ref 6.5–8.1)

## 2020-06-16 LAB — CBC WITH DIFFERENTIAL/PLATELET
Abs Immature Granulocytes: 0.01 10*3/uL (ref 0.00–0.07)
Basophils Absolute: 0 10*3/uL (ref 0.0–0.1)
Basophils Relative: 0 %
Eosinophils Absolute: 0.1 10*3/uL (ref 0.0–0.5)
Eosinophils Relative: 2 %
HCT: 36 % (ref 36.0–46.0)
Hemoglobin: 11.5 g/dL — ABNORMAL LOW (ref 12.0–15.0)
Immature Granulocytes: 0 %
Lymphocytes Relative: 44 %
Lymphs Abs: 2.6 10*3/uL (ref 0.7–4.0)
MCH: 29.8 pg (ref 26.0–34.0)
MCHC: 31.9 g/dL (ref 30.0–36.0)
MCV: 93.3 fL (ref 80.0–100.0)
Monocytes Absolute: 0.7 10*3/uL (ref 0.1–1.0)
Monocytes Relative: 12 %
Neutro Abs: 2.5 10*3/uL (ref 1.7–7.7)
Neutrophils Relative %: 42 %
Platelets: 248 10*3/uL (ref 150–400)
RBC: 3.86 MIL/uL — ABNORMAL LOW (ref 3.87–5.11)
RDW: 15.1 % (ref 11.5–15.5)
WBC: 6 10*3/uL (ref 4.0–10.5)
nRBC: 0 % (ref 0.0–0.2)

## 2020-06-17 ENCOUNTER — Telehealth: Payer: Self-pay | Admitting: Oncology

## 2020-06-17 NOTE — Telephone Encounter (Signed)
Scheduled appts per 2/22 los. Pt confirmed appt date/time.

## 2020-06-21 ENCOUNTER — Other Ambulatory Visit: Payer: Self-pay | Admitting: Family Medicine

## 2020-06-21 DIAGNOSIS — M8000XG Age-related osteoporosis with current pathological fracture, unspecified site, subsequent encounter for fracture with delayed healing: Secondary | ICD-10-CM

## 2020-06-30 ENCOUNTER — Other Ambulatory Visit: Payer: Self-pay | Admitting: Family Medicine

## 2020-07-06 ENCOUNTER — Other Ambulatory Visit: Payer: Medicare PPO

## 2020-07-06 ENCOUNTER — Ambulatory Visit: Payer: Medicare PPO | Admitting: Oncology

## 2020-07-14 ENCOUNTER — Telehealth: Payer: Self-pay | Admitting: Family Medicine

## 2020-07-14 NOTE — Telephone Encounter (Signed)
Called patient to schedule Annual Wellness Visit.  Please schedule with Nurse Health Advisor Caroleen Hamman, RN at [facility]   No voicemalil

## 2020-07-14 NOTE — Telephone Encounter (Signed)
Left message for patient to schedule Annual Wellness Visit.  Please schedule with Nurse Health Advisor Martha Stanley, RN at [facility]  

## 2020-07-15 ENCOUNTER — Encounter: Payer: Self-pay | Admitting: Family Medicine

## 2020-07-15 ENCOUNTER — Ambulatory Visit: Payer: Medicare PPO | Admitting: Family Medicine

## 2020-07-15 ENCOUNTER — Other Ambulatory Visit: Payer: Self-pay

## 2020-07-15 VITALS — BP 124/74 | HR 75 | Temp 97.3°F | Ht 64.0 in | Wt 164.6 lb

## 2020-07-15 DIAGNOSIS — R1313 Dysphagia, pharyngeal phase: Secondary | ICD-10-CM

## 2020-07-15 DIAGNOSIS — R0989 Other specified symptoms and signs involving the circulatory and respiratory systems: Secondary | ICD-10-CM | POA: Diagnosis not present

## 2020-07-15 DIAGNOSIS — H9193 Unspecified hearing loss, bilateral: Secondary | ICD-10-CM | POA: Diagnosis not present

## 2020-07-15 NOTE — Patient Instructions (Signed)
Follow up as needed or as scheduled CONTINUE the daily Prilosec RESTART your daily Claritin We'll call you with your ENT and audiology appts Try not to talk while swallowing as this may distract you and cause an uncoordinated swallow Call with any questions or concerns Hang in there!!!

## 2020-07-15 NOTE — Progress Notes (Signed)
Subjective:    Patient ID: Monica Diaz, female    DOB: 04/20/1941, 80 y.o.   MRN: [id number]  HPI 'something is happening w/ my throat'- a few weeks ago pt reports she was 'unable to release' a breath she had just taken.  At the time she was not eating or drinking.  Her daughter did the Heimlich maneuver and pt was able to breathe again.  She has a sensation that something is stuck in the bottom of her throat.  Has chronic PND and possibly GERD.  Currently on daily Claritin. 10 days ago started daily Prilosec.  Pt notes that periodically she feels unable to take a deep breath.  Not related to exercise.  Reports she will have to take 2-3 shallow breaths to feel that she has taken a complete breath  Decreased hearing- pt went to [facility] and had a hearing evaluation.  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  Would like a formal audiology evaluation.  Review of Systems For ROS see HPI   This visit occurred during the SARS-CoV-2 public health emergency.  Safety protocols were in place, including screening questions prior to the visit, additional usage of staff PPE, and extensive cleaning of exam room while observing appropriate contact time as indicated for disinfecting solutions.       Objective:   Physical Exam Vitals reviewed.  Constitutional:      General: She is not in acute distress.    Appearance: Normal appearance. She is not ill-appearing.  HENT:     Head: Normocephalic and atraumatic.     Mouth/Throat:     Mouth: Mucous membranes are moist.     Comments: Copious PND Eyes:     Extraocular Movements: Extraocular movements intact.     Conjunctiva/sclera: Conjunctivae normal.     Pupils: Pupils are equal, round, and reactive to light.  Neck:     Comments: No appreciable thyromegaly Cardiovascular:     Rate and Rhythm: Normal rate and regular rhythm.     Pulses: Normal pulses.  Pulmonary:     Effort: Pulmonary effort is normal.     Breath sounds: Normal breath  sounds.  Musculoskeletal:     Cervical back: Normal range of motion and neck supple. No tenderness.  Lymphadenopathy:     Cervical: No cervical adenopathy.  Skin:    General: Skin is warm and dry.  Neurological:     General: No focal deficit present.     Mental Status: She is alert and oriented to person, place, and time.  Psychiatric:        Mood and Affect: Mood normal.        Behavior: Behavior normal.        Thought Content: Thought content normal.           Assessment & Plan:  Pharyngeal dysphagia- new.  Pt has had 2 episodes that have scared her.  The first involved a life saver and she attributed the choking experience to the candy.  The 2nd one occurred in the absence of food or drink, she just choked on her saliva.  In both instances, the heimlich was performed and pt was then again able to breathe.  She is aware that PND and reflux can cause inflammation and tightening of the throat and she has started daily PPI and antihistamine.  In a way, it almost seems as if there's a coordination issue w/ her swallowing.  Will refer to ENT for complete evaluation.  Pt expressed understanding  and is in agreement w/ plan.   Hearing loss- pt was told by [facility] that she has significant sensorineural hearing loss and they then proceeded to try and sell her an expensive hearing aid.  Pt would like an unbiased evaluation.  Referral placed to audiology.  Shallow breathing- again wonder if this is due to dysphagia/stricture/swallowing issue.  Pt has no problem with exercise or loudly cheering at the [employer] games.  She can go up and down her stairs w/o difficulty.  She just periodically feels that it's hard to take a deep breath.  And after 2-3 shallow breaths, the feeling subsides.  Will see what ENT says and if needed, refer to pulmonary.  Pt expressed understanding and is in agreement w/ plan.

## 2020-07-27 ENCOUNTER — Other Ambulatory Visit: Payer: Self-pay

## 2020-07-27 ENCOUNTER — Ambulatory Visit (INDEPENDENT_AMBULATORY_CARE_PROVIDER_SITE_OTHER): Payer: Medicare PPO

## 2020-07-27 VITALS — BP 138/84 | HR 75 | Temp 98.2°F | Resp 16 | Ht 64.0 in | Wt 164.6 lb

## 2020-07-27 DIAGNOSIS — Z0000 Encounter for general adult medical examination without abnormal findings: Secondary | ICD-10-CM

## 2020-07-27 DIAGNOSIS — Z780 Asymptomatic menopausal state: Secondary | ICD-10-CM

## 2020-07-27 NOTE — Patient Instructions (Signed)
[name] , Thank you for taking time to come for your Medicare Wellness Visit. I appreciate your ongoing commitment to your health goals. Please review the following plan we discussed and let me know if I can assist you in the future.   Screening recommendations/referrals: Colonoscopy: No longer required Mammogram: Completed 06/15/2020-Due 06/15/2021 Bone Density: Ordered today. Someone will be calling you to schedule. Recommended yearly ophthalmology/optometry visit for glaucoma screening and checkup Recommended yearly dental visit for hygiene and checkup  Vaccinations: Influenza vaccine: Due 12/2020 Pneumococcal vaccine:Completed vaccines Tdap vaccine: Up to date-Due-03/18/2024 Shingles vaccine: Discuss with pharmacy  Covid-19: Completed vaccines  Advanced directives: Copy in chart  Conditions/risks identified: See problem list  Next appointment: Follow up in one year for your annual wellness visit 08/02/2021 @ 1:30   Preventive Care 78 Years and Older, Female Preventive care refers to lifestyle choices and visits with your health care provider that can promote health and wellness. What does preventive care include?  A yearly physical exam. This is also called an annual well check.  Dental exams once or twice a year.  Routine eye exams. Ask your health care provider how often you should have your eyes checked.  Personal lifestyle choices, including:  Daily care of your teeth and gums.  Regular physical activity.  Eating a healthy diet.  Avoiding tobacco and drug use.  Limiting alcohol use.  Practicing safe sex.  Taking low-dose aspirin every day.  Taking vitamin and mineral supplements as recommended by your health care provider. What happens during an annual well check? The services and screenings done by your health care provider during your annual well check will depend on your age, overall health, lifestyle risk factors, and family history of  disease. Counseling  Your health care provider may ask you questions about your:  Alcohol use.  Tobacco use.  Drug use.  Emotional well-being.  Home and relationship well-being.  Sexual activity.  Eating habits.  History of falls.  Memory and ability to understand (cognition).  Work and work Statistician.  Reproductive health. Screening  You may have the following tests or measurements:  Height, weight, and BMI.  Blood pressure.  Lipid and cholesterol levels. These may be checked every 5 years, or more frequently if you are over 80 years old.  Skin check.  Lung cancer screening. You may have this screening every year starting at age 44 if you have a 30-pack-year history of smoking and currently smoke or have quit within the past 15 years.  Fecal occult blood test (FOBT) of the stool. You may have this test every year starting at age 66.  Flexible sigmoidoscopy or colonoscopy. You may have a sigmoidoscopy every 5 years or a colonoscopy every 10 years starting at age 70.  Hepatitis C blood test.  Hepatitis B blood test.  Sexually transmitted disease (STD) testing.  Diabetes screening. This is done by checking your blood sugar (glucose) after you have not eaten for a while (fasting). You may have this done every 1-3 years.  Bone density scan. This is done to screen for osteoporosis. You may have this done starting at age 56.  Mammogram. This may be done every 1-2 years. Talk to your health care provider about how often you should have regular mammograms. Talk with your health care provider about your test results, treatment options, and if necessary, the need for more tests. Vaccines  Your health care provider may recommend certain vaccines, such as:  Influenza vaccine. This is recommended every year.  Tetanus, diphtheria, and acellular pertussis (Tdap, Td) vaccine. You may need a Td booster every 10 years.  Zoster vaccine. You may need this after age  4.  Pneumococcal 13-valent conjugate (PCV13) vaccine. One dose is recommended after age 106.  Pneumococcal polysaccharide (PPSV23) vaccine. One dose is recommended after age 30. Talk to your health care provider about which screenings and vaccines you need and how often you need them. This information is not intended to replace advice given to you by your health care provider. Make sure you discuss any questions you have with your health care provider. Document Released: 05/08/2015 Document Revised: 12/30/2015 Document Reviewed: 02/10/2015 Elsevier Interactive Patient Education  2017 [employer] Prevention in the Home Falls can cause injuries. They can happen to people of all ages. There are many things you can do to make your home safe and to help prevent falls. What can I do on the outside of my home?  Regularly fix the edges of walkways and driveways and fix any cracks.  Remove anything that might make you trip as you walk through a door, such as a raised step or threshold.  Trim any bushes or trees on the path to your home.  Use bright outdoor lighting.  Clear any walking paths of anything that might make someone trip, such as rocks or tools.  Regularly check to see if handrails are loose or broken. Make sure that both sides of any steps have handrails.  Any raised decks and porches should have guardrails on the edges.  Have any leaves, snow, or ice cleared regularly.  Use sand or salt on walking paths during winter.  Clean up any spills in your garage right away. This includes oil or grease spills. What can I do in the bathroom?  Use night lights.  Install grab bars by the toilet and in the tub and shower. Do not use towel bars as grab bars.  Use non-skid mats or decals in the tub or shower.  If you need to sit down in the shower, use a plastic, non-slip stool.  Keep the floor dry. Clean up any water that spills on the floor as soon as it happens.  Remove  soap buildup in the tub or shower regularly.  Attach bath mats securely with double-sided non-slip rug tape.  Do not have throw rugs and other things on the floor that can make you trip. What can I do in the bedroom?  Use night lights.  Make sure that you have a light by your bed that is easy to reach.  Do not use any sheets or blankets that are too big for your bed. They should not hang down onto the floor.  Have a firm chair that has side arms. You can use this for support while you get dressed.  Do not have throw rugs and other things on the floor that can make you trip. What can I do in the kitchen?  Clean up any spills right away.  Avoid walking on wet floors.  Keep items that you use a lot in easy-to-reach places.  If you need to reach something above you, use a strong step stool that has a grab bar.  Keep electrical cords out of the way.  Do not use floor polish or wax that makes floors slippery. If you must use wax, use non-skid floor wax.  Do not have throw rugs and other things on the floor that can make you trip. What can I do  with my stairs?  Do not leave any items on the stairs.  Make sure that there are handrails on both sides of the stairs and use them. Fix handrails that are broken or loose. Make sure that handrails are as long as the stairways.  Check any carpeting to make sure that it is firmly attached to the stairs. Fix any carpet that is loose or worn.  Avoid having throw rugs at the top or bottom of the stairs. If you do have throw rugs, attach them to the floor with carpet tape.  Make sure that you have a light switch at the top of the stairs and the bottom of the stairs. If you do not have them, ask someone to add them for you. What else can I do to help prevent falls?  Wear shoes that:  Do not have high heels.  Have rubber bottoms.  Are comfortable and fit you well.  Are closed at the toe. Do not wear sandals.  If you use a  stepladder:  Make sure that it is fully opened. Do not climb a closed stepladder.  Make sure that both sides of the stepladder are locked into place.  Ask someone to hold it for you, if possible.  Clearly mark and make sure that you can see:  Any grab bars or handrails.  First and last steps.  Where the edge of each step is.  Use tools that help you move around (mobility aids) if they are needed. These include:  Canes.  Walkers.  Scooters.  Crutches.  Turn on the lights when you go into a dark area. Replace any light bulbs as soon as they burn out.  Set up your furniture so you have a clear path. Avoid moving your furniture around.  If any of your floors are uneven, fix them.  If there are any pets around you, be aware of where they are.  Review your medicines with your doctor. Some medicines can make you feel dizzy. This can increase your chance of falling. Ask your doctor what other things that you can do to help prevent falls. This information is not intended to replace advice given to you by your health care provider. Make sure you discuss any questions you have with your health care provider. Document Released: 02/05/2009 Document Revised: 09/17/2015 Document Reviewed: 05/16/2014 Elsevier Interactive Patient Education  2017 [employer].

## 2020-07-27 NOTE — Progress Notes (Signed)
Subjective:   Monica Diaz is a 80 y.o. female who presents for Medicare Annual (Subsequent) preventive examination.  Review of Systems     Cardiac Risk Factors include: advanced age (>74men, >61 women);dyslipidemia     Objective:    Today's Vitals   07/27/20 1332  BP: 138/84  Pulse: 75  Resp: 16  Temp: 98.2 F (36.8 C)  TempSrc: Temporal  SpO2: 98%  Weight: 164 lb 9.6 oz (74.7 kg)  Height: 5\' 4"  (1.626 m)   Body mass index is 28.25 kg/m.  Advanced Directives 07/27/2020 09/14/2016 12/17/2015 05/26/2015 05/21/2015 06/05/2014 05/23/2013  Does Patient Have a Medical Advance Directive? Yes Yes Yes Yes No No Patient does not have advance directive;Patient would not like information  Type of Scientist, forensic Power of [place];Living will [place];Living will Living will;Healthcare Power of Attorney - - - -  Copy of [place] in Chart? Yes - validated most recent copy scanned in chart (See row information) No - copy requested Yes - - - -    Current Medications (verified) Outpatient Encounter Medications as of 07/27/2020  Medication Sig  . alendronate (FOSAMAX) 70 MG tablet TAKE 1 TABLET BY MOUTH WITH A FULL GLASS OF WATER ON AN EMPTY STOMACH  . ARMOUR THYROID 60 MG tablet TAKE 1 TABLET BY MOUTH DAILY BEFORE BREAKFAST  . calcium citrate-vitamin D (CITRACAL+D) 315-200 MG-UNIT per tablet Take 1 tablet by mouth 2 (two) times daily.  . Coenzyme Q10 (COQ10) 200 MG CAPS Take 1 capsule by mouth at bedtime.  Marland Kitchen loratadine (CLARITIN) 10 MG tablet Take 10 mg by mouth daily as needed.   . mometasone (NASONEX) 50 MCG/ACT nasal spray Place 2 sprays into the nose daily as needed.  . Multiple Vitamins-Minerals (CENTRUM SILVER PO) Take 1 tablet by mouth.  . pyridoxine (B-6) 100 MG tablet Take 100 mg by mouth 2 (two) times daily.  . Red Yeast Rice 500 MG/0.5GM POWD Take by mouth.  . vitamin B-12 (CYANOCOBALAMIN) 50 MCG tablet Take 50 mcg by mouth.   . vitamin C (ASCORBIC ACID) 500 MG tablet Take 500 mg by mouth 2 (two) times daily.   No facility-administered encounter medications on file as of 07/27/2020.    Allergies (verified) Codeine   History: Past Medical History:  Diagnosis Date  . Arthritis   . Breast cancer ([place]) 2015  . Cancer ([place]) 2015   breast  . HOH (hard of hearing)   . Hx of radiation therapy 07/18/13-08/14/13   right breast total dose 50 Gy  . Hyperlipidemia   . Osteoporosis   . Personal history of radiation therapy 2015  . Seasonal allergies   . Sleep apnea    uses a cpap  . Thyroid disease   . Wears glasses    Past Surgical History:  Procedure Laterality Date  . ABDOMINAL HYSTERECTOMY     fibroids  . BREAST BIOPSY Right 2015   core   . BREAST BIOPSY Left 06/18/2018   DENSE FIBROSIS WITH CALCIFICATIONS AND CHRONIC  . BREAST BIOPSY Left 06/07/2017  . BREAST EXCISIONAL BIOPSY Right 2005  . BREAST LUMPECTOMY Right   . COLONOSCOPY    . LEFT OOPHORECTOMY    . PARTIAL MASTECTOMY WITH NEEDLE LOCALIZATION AND AXILLARY SENTINEL LYMPH NODE BX Right 05/28/2013   Procedure: PARTIAL MASTECTOMY WITH NEEDLE LOCALIZATION AND AXILLARY SENTINEL LYMPH NODE BIOPSY;  Surgeon: Adin Hector, MD;  Location: [facility];  Service: General;  Laterality: Right;  Family History  Problem Relation Age of Onset  . Hypertension Mother   . Diabetes Maternal Aunt   . Stroke Other        grandfather  . Breast cancer Neg Hx    Social History   Socioeconomic History  . Marital status: Divorced    Spouse name: Not on file  . Number of children: 2  . Years of education: Not on file  . Highest education level: Not on file  Occupational History    Employer: RETIRED  Tobacco Use  . Smoking status: Never Smoker  . Smokeless tobacco: Never Used  Vaping Use  . Vaping Use: Never used  Substance and Sexual Activity  . Alcohol use: Yes    Comment: occasionally  . Drug use: No  . Sexual activity: Not on file   Other Topics Concern  . Not on file  Social History Narrative   Right handed, Caffeine 2-3 cups average daily.  Divorced, 2 daughters.     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     Social Determinants of Health   Financial Resource Strain: Low Risk   . Difficulty of Paying Living Expenses: Not hard at all  Food Insecurity: No Food Insecurity  . Worried About Charity fundraiser in the Last Year: Never true  . Ran Out of Food in the Last Year: Never true  Transportation Needs: No Transportation Needs  . Lack of Transportation (Medical): No  . Lack of Transportation (Non-Medical): No  Physical Activity: Sufficiently Active  . Days of Exercise per Week: 3 days  . Minutes of Exercise per Session: 50 min  Stress: No Stress Concern Present  . Feeling of Stress : Not at all  Social Connections: Moderately Integrated  . Frequency of Communication with Friends and Family: More than three times a week  . Frequency of Social Gatherings with Friends and Family: More than three times a week  . Attends Religious Services: More than 4 times per year  . Active Member of Clubs or Organizations: Yes  . Attends Archivist Meetings: 1 to 4 times per year  . Marital Status: Divorced    Tobacco Counseling Counseling given: Not Answered   Clinical Intake:  Pre-visit preparation completed: Yes  Pain : No/denies pain     Nutritional Status: BMI 25 -29 Overweight Nutritional Risks: None Diabetes: No  How often do you need to have someone help you when you read instructions, pamphlets, or other written materials from your doctor or pharmacy?: 1 - Never  Diabetic?No  Interpreter Needed?: No  Information entered by :: Caroleen Hamman LPN   Activities of Daily Living In your present state of health, do you have any difficulty performing the following activities: 07/27/2020 12/09/2019  Hearing? N Y  Vision? N N  Difficulty concentrating or making decisions? Y N  Comment occasionally  forgets a name -  Walking or climbing stairs? N N  Dressing or bathing? N N  Doing errands, shopping? N N  Preparing Food and eating ? N -  Using the Toilet? N -  In the past six months, have you accidently leaked urine? N -  Do you have problems with loss of bowel control? N -  Managing your Medications? N -  Managing your Finances? N -  Housekeeping or managing your Housekeeping? N -  Some recent data might be hidden    Patient Care Team: Midge Minium, MD as PCP - General Dohmeier, Asencion Partridge, MD as Consulting Physician (Neurology)  Juanita Craver, MD as Consulting Physician (Gastroenterology) Magrinat, Virgie Dad, MD as Consulting Physician (Oncology) Roseanne Kaufman, MD as Consulting Physician (Orthopedic Surgery)  Indicate any recent Medical Services you may have received from other than [facility] providers in the past year (date may be approximate).     Assessment:   This is a routine wellness examination for Monica Diaz.  Hearing/Vision screen  Hearing Screening   125Hz  250Hz  500Hz  1000Hz  2000Hz  3000Hz  4000Hz  6000Hz  8000Hz   Right ear:           Left ear:           Comments: C/o some hearing loss & has an audiology appt scheduled.  Vision Screening Comments: Wears glasses Last eye exam-09/2019-  Dietary issues and exercise activities discussed: Current Exercise Habits: Home exercise routine, Type of exercise: walking;strength training/weights, Time (Minutes): 50, Frequency (Times/Week): 3, Weekly Exercise (Minutes/Week): 150, Intensity: Mild, Exercise limited by: None identified  Goals    . Patient Stated     Increase activity & continue to drink plenty of water.    . Weight (lb) < 148 lb (67.1 kg)     Lose 10 pounds by being consistent with work outs and watching food intake.       Depression Screen PHQ 2/9 Scores 07/27/2020 12/09/2019 12/06/2018 02/22/2018 03/13/2017 01/02/2017 09/14/2016  PHQ - 2 Score 0 1 2 0 0 0 0  PHQ- 9 Score - 1 2 0 0 0 -    Fall Risk Fall Risk   07/27/2020 12/09/2019 12/06/2018 12/06/2018 02/22/2018  Falls in the past year? 1 0 1 1 No  Number falls in past yr: 0 0 1 0 -  Injury with Fall? 0 0 0 0 -  Follow up Falls prevention discussed Falls evaluation completed - - -    FALL RISK PREVENTION PERTAINING TO THE HOME:  Any stairs in or around the home? Yes  If so, are there any without handrails? No  Home free of loose throw rugs in walkways, pet beds, electrical cords, etc? Yes  Adequate lighting in your home to reduce risk of falls? Yes   ASSISTIVE DEVICES UTILIZED TO PREVENT FALLS:  Life alert? No  Use of a cane, walker or w/c? No  Grab bars in the bathroom? No  Shower chair or bench in shower? No  Elevated toilet seat or a handicapped toilet? No   TIMED UP AND GO:  Was the test performed? Yes .  Length of time to ambulate 10 feet: 10 sec.   Gait steady and fast without use of assistive device  Cognitive Function:Normal cognitive status assessed by direct observation by this Nurse Health Advisor. No abnormalities found.       6CIT Screen 07/27/2020  What Year? 0 points  What month? 0 points  What time? 0 points  Count back from 20 0 points  Months in reverse 0 points  Repeat phrase 0 points  Total Score 0    Immunizations Immunization History  Administered Date(s) Administered  . Influenza, High Dose Seasonal PF 02/22/2018, 03/06/2019  . Influenza,inj,Quad PF,6+ Mos 03/13/2017  . PFIZER(Purple Top)SARS-COV-2 Vaccination 05/10/2019, 05/30/2019, 02/11/2020  . Pneumococcal Conjugate-13 01/02/2014  . Pneumococcal Polysaccharide-23 02/27/2015  . Td 03/18/2014    TDAP status: Up to date  Flu Vaccine status: Due, Education has been provided regarding the importance of this vaccine. Advised may receive this vaccine at local pharmacy or Health Dept. Aware to provide a copy of the vaccination record if obtained from local pharmacy or Health Dept.  Verbalized acceptance and understanding.  Pneumococcal vaccine status:  Up to date  Covid-19 vaccine status: Completed vaccines  Qualifies for Shingles Vaccine? Yes   Zostavax completed No   Shingrix Completed?: No.    Education has been provided regarding the importance of this vaccine. Patient has been advised to call insurance company to determine out of pocket expense if they have not yet received this vaccine. Advised may also receive vaccine at local pharmacy or Health Dept. Verbalized acceptance and understanding.  Screening Tests Health Maintenance  Topic Date Due  . Hepatitis C Screening  12/08/2020 (Originally April 29, 1940)  . COVID-19 Vaccine (4 - Booster for Pfizer series) 08/11/2020  . INFLUENZA VACCINE  11/23/2020  . MAMMOGRAM  06/15/2021  . TETANUS/TDAP  03/18/2024  . DEXA SCAN  Completed  . PNA vac Low Risk Adult  Completed  . HPV VACCINES  Aged Out    Health Maintenance  There are no preventive care reminders to display for this patient.  Colorectal cancer screening: No longer required.   Mammogram status: Completed Bilateral 06/15/2020. Repeat every year  Bone Density status: Ordered today. Pt provided with contact info and advised to call to schedule appt.  Lung Cancer Screening: (Low Dose CT Chest recommended if Age 35-80 years, 30 pack-year currently smoking OR have quit w/in 15years.) does not qualify.    Additional Screening:  Hepatitis C Screening: does not qualify  Vision Screening: Recommended annual ophthalmology exams for early detection of glaucoma and other disorders of the eye. Is the patient up to date with their annual eye exam?  Yes  Who is the provider or what is the name of the office in which the patient attends annual eye exams? Pt unsure of name  Dental Screening: Recommended annual dental exams for proper oral hygiene  Community Resource Referral / Chronic Care Management: CRR required this visit?  No   CCM required this visit?  No      Plan:     I have personally reviewed and noted the following  in the patient's chart:   . Medical and social history . Use of alcohol, tobacco or illicit drugs  . Current medications and supplements . Functional ability and status . Nutritional status . Physical activity . Advanced directives . List of other physicians . Hospitalizations, surgeries, and ER visits in previous 12 months . Vitals . Screenings to include cognitive, depression, and falls . Referrals and appointments  In addition, I have reviewed and discussed with patient certain preventive protocols, quality metrics, and best practice recommendations. A written personalized care plan for preventive services as well as general preventive health recommendations were provided to patient.     Marta Antu, LPN   11/28/7617  Nurse Health Advisor  Nurse Notes: None

## 2020-08-18 DIAGNOSIS — J309 Allergic rhinitis, unspecified: Secondary | ICD-10-CM | POA: Diagnosis not present

## 2020-08-18 DIAGNOSIS — K219 Gastro-esophageal reflux disease without esophagitis: Secondary | ICD-10-CM | POA: Diagnosis not present

## 2020-08-20 ENCOUNTER — Other Ambulatory Visit: Payer: Self-pay

## 2020-08-20 ENCOUNTER — Ambulatory Visit: Payer: Medicare PPO | Attending: Family Medicine | Admitting: Audiologist

## 2020-08-20 DIAGNOSIS — H918X9 Other specified hearing loss, unspecified ear: Secondary | ICD-10-CM | POA: Insufficient documentation

## 2020-08-20 DIAGNOSIS — H903 Sensorineural hearing loss, bilateral: Secondary | ICD-10-CM | POA: Insufficient documentation

## 2020-08-20 NOTE — Procedures (Signed)
Outpatient Audiology and [place], [place]  [address] [phone]  AUDIOLOGICAL  EVALUATION  NAME: Monica Diaz     DOB:   July 01, 1940      MRN: [id number]                                                                                     DATE: 08/20/2020     REFERENT: Monica Minium, MD STATUS: Outpatient DIAGNOSIS: Sensorineural Hearing Loss Bilateral   History: Monica Diaz was seen for an audiological evaluation.  Monica Diaz is receiving a hearing evaluation due to concerns for increased difficulty hearing. Monica Diaz has difficulty hearing in background noise, crowds, and when people are at a distance. She says some TV stations are easier to hear than others. Her family has told her she is not hearing well and the television is too loud. Before retiring she had difficulty hearing students that were at a distance. This difficulty began gradually. No pain or pressure reported in either ear. Monica Diaz. Monica Diaz denied any history of noise exposure.  Monica Diaz had a hearing test with [facility] recently. They recommended hearing aids. 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 She wants a full test today with an audiologist and some further explanation as she was very confused by the provider's statement.  Medical history negative for any condition that is a risk factor for hearing loss. No other relevant case history reported.   Evaluation:   Otoscopy showed a clear view of the tympanic membranes, bilaterally  Tympanometry results were consistent with normal middle ear function, bilaterally    Audiometric testing was completed using conventional audiometry with inserts then cross checked with supraural transducer. Speech Recognition Thresholds were consistent with pure tone averages. Word Recognition was good in the left ear (96%) and  fair in the right ear (76%) at an elevated level. Monica Diaz could not tolerate above 80dB for speech in the right ear. Pure tone thresholds show normal sloping to moderately severe hearing loss  in both ears. Test results are consistent with asymmetry with the right 5-25dB worse from 250-8k Hz. See audiogram scanned under media for reference.  Results:  The test results were reviewed with Monica Diaz, she was counseled on the nature and degree of hers hearing loss. She was provided with several copies of her audiogram that illustrate her degree of hearing loss in both ears. Her hearing loss is in the high frequencies preventing Monica Diaz from hearing high frequency consonants such as /s/ /sh/ /f/ /t/ and /th/. These sounds help differentiate the words she hears. Without these sounds, speech is muffled and unclear unless someone is face to face within 5 feet without a mask. Monica Diaz also has an asymmetry in her hearing with the right ear worse. This was rechecked and consistent, no matter which headphones were used. Monica Diaz does not recall any medical reason or one sided noise exposure (such as firearms) for her right ear being worse. Monica Diaz is an excellent hearing aid candidate but will need to see an ENT Physician first for clearance. With hearing  aids the clarity of speech will improve and Monica Diaz will not have to work so hard to hear. Monica Diaz was provided with a list of audiologists that dispense hearing aids. She saw Monica Diaz last week at [facility]. She requested that the referral be sent back there and include a referral for a hearing aid consult appointment after.     Monica Diaz was counseled on the association of dementia and hearing loss. What the [facility] provider was referencing was likely a study that came out in the Lancet 2017 saying that treating hearing loss is the number one modifiable risk factor for hearing loss. This has been used by some providers as a reason to  buy hearing aids. While this is true, treating hearing loss can help decrease a person's likelihood of having some degree of cognitive decline, hearing aids not are not cure and do not prevent dementia on their own. Also, the link between hearing loss and dementia much more nuanced than that. The likelihood someone acquires dementia is 60% genetic. Monica Diaz says her mother is in her 67s and still doing well and is very active. Monica Diaz should use hearing aids to help her stay active and social as well, and this will help more than anything to keep her healthy. She reported understanding.   Recommendations: 1. Amplification is necessary for both ears. Hearing aids can be purchased from a variety of locations. See provided list for locations in the [place] area.  2. Referral to ENT Physician, Monica Diaz [facility], necessary due to some slight asymmetry between the ears with the right ear worse.    Monica Diaz  Audiologist, Au.D., CCC-A 08/20/2020  2:34 PM  Cc: Monica Minium, MD

## 2020-08-21 NOTE — Progress Notes (Signed)
Referral placed as recommended

## 2020-08-21 NOTE — Addendum Note (Signed)
Addended by: Midge Minium on: 08/21/2020 07:28 AM   Modules accepted: Orders

## 2020-09-01 DIAGNOSIS — Z20822 Contact with and (suspected) exposure to covid-19: Secondary | ICD-10-CM | POA: Diagnosis not present

## 2020-09-14 ENCOUNTER — Other Ambulatory Visit: Payer: Self-pay | Admitting: Family Medicine

## 2020-09-14 DIAGNOSIS — M8000XG Age-related osteoporosis with current pathological fracture, unspecified site, subsequent encounter for fracture with delayed healing: Secondary | ICD-10-CM

## 2020-10-21 ENCOUNTER — Encounter: Payer: Self-pay | Admitting: *Deleted

## 2020-12-10 ENCOUNTER — Encounter: Payer: Medicare PPO | Admitting: Family Medicine

## 2020-12-14 ENCOUNTER — Other Ambulatory Visit: Payer: Self-pay | Admitting: Family Medicine

## 2020-12-14 DIAGNOSIS — M8000XG Age-related osteoporosis with current pathological fracture, unspecified site, subsequent encounter for fracture with delayed healing: Secondary | ICD-10-CM

## 2020-12-15 ENCOUNTER — Other Ambulatory Visit: Payer: Self-pay

## 2021-01-01 ENCOUNTER — Encounter: Payer: Medicare PPO | Admitting: Family Medicine

## 2021-01-05 ENCOUNTER — Telehealth: Payer: Self-pay | Admitting: *Deleted

## 2021-01-05 NOTE — Chronic Care Management (AMB) (Signed)
  Chronic Care Management   Outreach Note  01/05/2021 Name: Monica Diaz MRN: [id number] DOB: [birth year]  Monica Diaz is a 81 y.o. year old female who is a primary care patient of Birdie Riddle, Aundra Millet, MD. I reached out to Monica Diaz by phone today in response to a referral sent by Ms. Conchita Paris Roll's PCP, Midge Minium, MD      An unsuccessful telephone outreach was attempted today. The patient was referred to the case management team for assistance with care management and care coordination.   Follow Up Plan: A HIPAA compliant phone message was left for the patient providing contact information and requesting a return call. The care management team will reach out to the patient again over the next 7 days.  If patient returns call to provider office, please advise to call [facility] at [phone].  [facility]  Direct Dial: [phone]

## 2021-01-05 NOTE — Chronic Care Management (AMB) (Signed)
  Chronic Care Management   Note  01/05/2021 Name: Monica Diaz MRN: [id number] DOB: October 29, 1940  Monica Diaz is a 80 y.o. year old female who is a primary care patient of Birdie Riddle, Aundra Millet, MD. I reached out to [name] by phone today in response to a referral sent by Ms. Conchita Paris Cuccia's PCP, Midge Minium, MD      Ms. Mackintosh was given information about Chronic Care Management services today including:  CCM service includes personalized support from designated clinical staff supervised by her physician, including individualized plan of care and coordination with other care providers 24/7 contact phone numbers for assistance for urgent and routine care needs. Service will only be billed when office clinical staff spend 20 minutes or more in a month to coordinate care. Only one practitioner may furnish and bill the service in a calendar month. The patient may stop CCM services at any time (effective at the end of the month) by phone call to the office staff. The patient will be responsible for cost sharing (co-pay) of up to 20% of the service fee (after annual deductible is met).  Patient agreed to services and verbal consent obtained.   Follow up plan: Telephone appointment with care management team member scheduled for:01/13/21  [facility]  Direct Dial: [phone]

## 2021-01-13 ENCOUNTER — Telehealth: Payer: Medicare PPO

## 2021-01-15 DIAGNOSIS — K219 Gastro-esophageal reflux disease without esophagitis: Secondary | ICD-10-CM | POA: Diagnosis not present

## 2021-01-15 DIAGNOSIS — I951 Orthostatic hypotension: Secondary | ICD-10-CM | POA: Diagnosis not present

## 2021-01-15 DIAGNOSIS — R030 Elevated blood-pressure reading, without diagnosis of hypertension: Secondary | ICD-10-CM | POA: Diagnosis not present

## 2021-01-15 DIAGNOSIS — M810 Age-related osteoporosis without current pathological fracture: Secondary | ICD-10-CM | POA: Diagnosis not present

## 2021-01-15 DIAGNOSIS — G4733 Obstructive sleep apnea (adult) (pediatric): Secondary | ICD-10-CM | POA: Diagnosis not present

## 2021-01-15 DIAGNOSIS — J309 Allergic rhinitis, unspecified: Secondary | ICD-10-CM | POA: Diagnosis not present

## 2021-01-15 DIAGNOSIS — G8929 Other chronic pain: Secondary | ICD-10-CM | POA: Diagnosis not present

## 2021-01-15 DIAGNOSIS — E785 Hyperlipidemia, unspecified: Secondary | ICD-10-CM | POA: Diagnosis not present

## 2021-01-15 DIAGNOSIS — E039 Hypothyroidism, unspecified: Secondary | ICD-10-CM | POA: Diagnosis not present

## 2021-01-26 ENCOUNTER — Ambulatory Visit: Payer: Medicare PPO | Admitting: Family Medicine

## 2021-01-26 ENCOUNTER — Other Ambulatory Visit: Payer: Self-pay

## 2021-01-26 ENCOUNTER — Encounter: Payer: Self-pay | Admitting: Family Medicine

## 2021-01-26 VITALS — BP 130/80 | HR 70 | Temp 98.4°F | Resp 16 | Wt 164.4 lb

## 2021-01-26 DIAGNOSIS — E785 Hyperlipidemia, unspecified: Secondary | ICD-10-CM

## 2021-01-26 DIAGNOSIS — E039 Hypothyroidism, unspecified: Secondary | ICD-10-CM | POA: Diagnosis not present

## 2021-01-26 DIAGNOSIS — I959 Hypotension, unspecified: Secondary | ICD-10-CM

## 2021-01-26 DIAGNOSIS — Z23 Encounter for immunization: Secondary | ICD-10-CM

## 2021-01-26 LAB — CBC WITH DIFFERENTIAL/PLATELET
Basophils Absolute: 0 10*3/uL (ref 0.0–0.1)
Basophils Relative: 0.2 % (ref 0.0–3.0)
Eosinophils Absolute: 0 10*3/uL (ref 0.0–0.7)
Eosinophils Relative: 1.1 % (ref 0.0–5.0)
HCT: 34.7 % — ABNORMAL LOW (ref 36.0–46.0)
Hemoglobin: 11.4 g/dL — ABNORMAL LOW (ref 12.0–15.0)
Lymphocytes Relative: 41.1 % (ref 12.0–46.0)
Lymphs Abs: 1.7 10*3/uL (ref 0.7–4.0)
MCHC: 32.9 g/dL (ref 30.0–36.0)
MCV: 90.6 fl (ref 78.0–100.0)
Monocytes Absolute: 0.5 10*3/uL (ref 0.1–1.0)
Monocytes Relative: 11.7 % (ref 3.0–12.0)
Neutro Abs: 1.9 10*3/uL (ref 1.4–7.7)
Neutrophils Relative %: 45.9 % (ref 43.0–77.0)
Platelets: 256 10*3/uL (ref 150.0–400.0)
RBC: 3.84 Mil/uL — ABNORMAL LOW (ref 3.87–5.11)
RDW: 15.4 % (ref 11.5–15.5)
WBC: 4.2 10*3/uL (ref 4.0–10.5)

## 2021-01-26 LAB — HEPATIC FUNCTION PANEL
ALT: 13 U/L (ref 0–35)
AST: 17 U/L (ref 0–37)
Albumin: 4 g/dL (ref 3.5–5.2)
Alkaline Phosphatase: 46 U/L (ref 39–117)
Bilirubin, Direct: 0.1 mg/dL (ref 0.0–0.3)
Total Bilirubin: 0.3 mg/dL (ref 0.2–1.2)
Total Protein: 7.1 g/dL (ref 6.0–8.3)

## 2021-01-26 LAB — BASIC METABOLIC PANEL
BUN: 13 mg/dL (ref 6–23)
CO2: 26 mEq/L (ref 19–32)
Calcium: 9.3 mg/dL (ref 8.4–10.5)
Chloride: 105 mEq/L (ref 96–112)
Creatinine, Ser: 0.87 mg/dL (ref 0.40–1.20)
GFR: 63.7 mL/min (ref >60.00)
Glucose, Bld: 80 mg/dL (ref 70–99)
Potassium: 4 mEq/L (ref 3.5–5.1)
Sodium: 139 mEq/L (ref 135–145)

## 2021-01-26 LAB — LIPID PANEL
Cholesterol: 176 mg/dL (ref 0–200)
HDL: 67.4 mg/dL (ref >39.00)
LDL Cholesterol: 95 mg/dL (ref 0–99)
NonHDL: 108.79
Total CHOL/HDL Ratio: 3
Triglycerides: 67 mg/dL (ref 0.0–149.0)
VLDL: 13.4 mg/dL (ref 0.0–40.0)

## 2021-01-26 LAB — TSH: TSH: 1.35 u[IU]/mL (ref 0.35–5.50)

## 2021-01-26 NOTE — Assessment & Plan Note (Signed)
Chronic problem.  Currently on Red Yeast Rice.  Check labs and see if medication change is needed.

## 2021-01-26 NOTE — Patient Instructions (Signed)
Follow up as scheduled or as needed We'll notify you of your lab results and make any changes if needed Keep up the good work!  You look great!! Make sure you drink plenty of water!!! Call with any questions or concerns Happy Fall!!!

## 2021-01-26 NOTE — Assessment & Plan Note (Signed)
Chronic problem.  Currently asymptomatic.  Check labs.  Adjust meds prn  

## 2021-01-26 NOTE — Progress Notes (Signed)
   Subjective:    Patient ID: Monica Diaz, female    DOB: 12/08/1940, 80 y.o.   MRN: [id number]  HPI Hypothyroid- chronic problem, on Armour Thyroid 60mg  daily.  Denies fatigue, changes to skin/hair/nails.  Hyperlipidemia- chronic problem, on Red Yeast Rice daily.  Due for lipid panel.  No abd pain, N/V.  No CP, SOB.  Orthostasis- insurance sent a nurse to her home and did a seated BP that was 142 and then standing BP was 124.  Pt is asymptomatic.  No dizziness.  Pt reports since then, she has increased water intake.   Review of Systems For ROS see HPI   This visit occurred during the SARS-CoV-2 public health emergency.  Safety protocols were in place, including screening questions prior to the visit, additional usage of staff PPE, and extensive cleaning of exam room while observing appropriate contact time as indicated for disinfecting solutions.      Objective:   Physical Exam Vitals reviewed.  Constitutional:      General: She is not in acute distress.    Appearance: Normal appearance. She is well-developed. She is not ill-appearing.  HENT:     Head: Normocephalic and atraumatic.  Eyes:     Conjunctiva/sclera: Conjunctivae normal.     Pupils: Pupils are equal, round, and reactive to light.  Neck:     Thyroid: No thyromegaly.  Cardiovascular:     Rate and Rhythm: Normal rate and regular rhythm.     Pulses: Normal pulses.     Heart sounds: Normal heart sounds. No murmur heard. Pulmonary:     Effort: Pulmonary effort is normal. No respiratory distress.     Breath sounds: Normal breath sounds.  Abdominal:     General: There is no distension.     Palpations: Abdomen is soft.     Tenderness: There is no abdominal tenderness.  Musculoskeletal:     Cervical back: Normal range of motion and neck supple.     Right lower leg: No edema.     Left lower leg: No edema.  Lymphadenopathy:     Cervical: No cervical adenopathy.  Skin:    General: Skin is warm and dry.   Neurological:     Mental Status: She is alert and oriented to person, place, and time.  Psychiatric:        Mood and Affect: Mood normal.        Behavior: Behavior normal.        Thought Content: Thought content normal.          Assessment & Plan:   Orthostasis- new.  Pt reports if she hadn't been told there was a BP drop she would not have known it.  No signs or sxs on that day or any day.  Admits to decreased water intake at the time that was done but has since increased her fluids.  Reviewed possible causes- dehydration, heat, gravity- but that as long as she was not experiencing sxs there was no need to change or pursue anything.  Pt expressed understanding and is in agreement w/ plan.

## 2021-01-27 ENCOUNTER — Ambulatory Visit (INDEPENDENT_AMBULATORY_CARE_PROVIDER_SITE_OTHER): Payer: Medicare PPO

## 2021-01-27 DIAGNOSIS — E039 Hypothyroidism, unspecified: Secondary | ICD-10-CM

## 2021-01-27 DIAGNOSIS — C50411 Malignant neoplasm of upper-outer quadrant of right female breast: Secondary | ICD-10-CM

## 2021-01-27 DIAGNOSIS — E785 Hyperlipidemia, unspecified: Secondary | ICD-10-CM

## 2021-01-27 DIAGNOSIS — M818 Other osteoporosis without current pathological fracture: Secondary | ICD-10-CM

## 2021-01-27 NOTE — Chronic Care Management (AMB) (Signed)
Chronic Care Management   CCM RN Visit Note  01/27/2021 Name: Monica Diaz MRN: [id number] DOB: [birth year]  Subjective: Monica Diaz is a 80 y.o. year old female who is a primary care patient of Birdie Riddle, Aundra Millet, MD. The care management team was consulted for assistance with disease management and care coordination needs.    Engaged with patient by telephone for initial visit in response to provider referral for case management and/or care coordination services.   Consent to Services:  The patient was given the following information about Chronic Care Management services today, agreed to services, and gave verbal consent: 1. CCM service includes personalized support from designated clinical staff supervised by the primary care provider, including individualized plan of care and coordination with other care providers 2. 24/7 contact phone numbers for assistance for urgent and routine care needs. 3. Service will only be billed when office clinical staff spend 20 minutes or more in a month to coordinate care. 4. Only one practitioner may furnish and bill the service in a calendar month. 5.The patient may stop CCM services at any time (effective at the end of the month) by phone call to the office staff. 6. The patient will be responsible for cost sharing (co-pay) of up to 20% of the service fee (after annual deductible is met). Patient agreed to services and consent obtained.  Patient agreed to services and verbal consent obtained.   Assessment: Review of patient past medical history, allergies, medications, health status, including review of consultants reports, laboratory and other test data, was performed as part of comprehensive evaluation and provision of chronic care management services.   SDOH (Social Determinants of Health) assessments and interventions performed:  SDOH Interventions    Flowsheet Row Most Recent Value  SDOH Interventions   Food Insecurity Interventions  Intervention Not Indicated  Financial Strain Interventions Intervention Not Indicated  Housing Interventions Intervention Not Indicated  Physical Activity Interventions [employer]  Stress Interventions Intervention Not Indicated  Transportation Interventions Intervention Not Indicated        CCM Care Plan  Allergies  Allergen Reactions   Codeine     REACTION: sick to stomach    Outpatient Encounter Medications as of 01/27/2021  Medication Sig   alendronate (FOSAMAX) 70 MG tablet TAKE 1 TABLET BY MOUTH ONE PER WEEK WITH FULL GLASS OF WATER ON AN EMPTY STOMACH   ARMOUR THYROID 60 MG tablet TAKE 1 TABLET BY MOUTH DAILY BEFORE BREAKFAST   calcium citrate-vitamin D (CITRACAL+D) 315-200 MG-UNIT per tablet Take 1 tablet by mouth 2 (two) times daily.   Coenzyme Q10 (COQ10) 200 MG CAPS Take 1 capsule by mouth at bedtime.   loratadine (CLARITIN) 10 MG tablet Take 10 mg by mouth daily as needed.    mometasone (NASONEX) 50 MCG/ACT nasal spray Place 2 sprays into the nose daily as needed.   Multiple Vitamins-Minerals (CENTRUM SILVER PO) Take 1 tablet by mouth.   pyridoxine (B-6) 100 MG tablet Take 100 mg by mouth 2 (two) times daily.   Red Yeast Rice 500 MG/0.5GM POWD Take by mouth.   vitamin B-12 (CYANOCOBALAMIN) 50 MCG tablet Take 50 mcg by mouth.   vitamin C (ASCORBIC ACID) 500 MG tablet Take 500 mg by mouth 2 (two) times daily.   No facility-administered encounter medications on file as of 01/27/2021.    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   Other neutropenia ([place]) 05/13/2011   General  medical examination 05/13/2011   BUNION 03/25/2010   Hypothyroidism 12/02/2009   BENIGN NEOPLASM OF SKIN SITE UNSPECIFIED 02/10/2009   Vitamin D deficiency 02/10/2009   Hyperlipidemia 12/22/2008   RHINITIS 12/22/2008   NECK PAIN 12/22/2008   Osteoporosis 12/22/2008   VERTIGO 12/22/2008    Conditions to be addressed/monitored:HLD and osteoporosis, hypothyroidism, hx rt breast CA  Care Plan : Hyperlipidemia  Updates made by Dimitri Ped, RN since 01/27/2021 12:00 AM     Problem: Lack of self management of hyperlipidemia   Priority: High     Long-Range Goal: Effective self mangement of Hyperlipidemia   Start Date: 01/27/2021  Expected End Date: 06/23/2021  This Visit's Progress: On track  Priority: High  Note:   Current Barriers:  Poorly controlled hyperlipidemia, complicated by hypotension, osteoporosis  Current antihyperlipidemic regimen: red yeast rice Most recent lipid panel:     Component Value Date/Time   CHOL 176 01/26/2021 1157   TRIG 67.0 01/26/2021 1157   HDL 67.40 01/26/2021 1157   CHOLHDL 3 01/26/2021 1157   VLDL 13.4 01/26/2021 1157   LDLCALC 95 01/26/2021 1157   LDLDIRECT 163.8 04/11/2011 1423   ASCVD risk enhancing conditions: age >17 Unable to independently self manage hyperlipidemia States that since her Mother has moved in with her she has not been eating as health especially for breakfast and she has not been going to the [facility] to exercise.  States she is trying to drink more water to help keep her B/P higher.  States she is coping with having her Mother move in with her at this time [place]):  patient will work with Consulting civil engineer, providers, and care team towards execution of optimized self-health management plan patient will verbalize understanding of plan for self management of hyperlipidemia patient will attend all scheduled medical appointments: Dr. Birdie Riddle 03/24/21 patient will demonstrate improved adherence to  prescribed treatment plan for self management of hyperlipidemia the patient will demonstrate ongoing self health care management ability Interventions: Collaboration with Midge Minium, MD regarding development and update of comprehensive plan of care as evidenced by provider attestation and co-signature Inter-disciplinary care team collaboration (see longitudinal plan of care) Medication review performed; medication list updated in electronic medical record.  Inter-disciplinary care team collaboration (see longitudinal plan of care) Evaluation of current treatment plan related to self management of hyperlipidemia and patient's adherence to plan as established by provider. Provided education to patient re: self management of hyperlipidemia Provided patient with verbal and written educational materials related to self management of hyperlipidemia Discussed plans with patient for ongoing care management follow up and provided patient with direct contact information for care management team Reviewed importance of exercise to help control lipid levels and encouraged to walk around her neighborhood if she is unable to go to the [facility] Reviewed to avoid saturated fats, trans-fats and eat more fiber Reviewed to take red yeast rice as directed Reviewed to lower fatty foods, red meat, cheese, milk and increase fiber like whole grains and vegetables. Reviewed to take care of herself and that LCSW is available to help her with caregiver stress if needed in the future Patient Goals/Self-Care Activities: -  change to whole grain breads, cereal, pasta - drink 6 to 8 glasses of water each day - eat 3 to 5 servings of fruits and vegetables each day - eat fish at least once per week - fill half the plate with nonstarchy vegetables - limit fast food meals to no more than 1 per week - manage portion size - read food labels for fat, fiber, carbohydrates and portion size - reduce red meat to 2 to 3 times a  week - switch to low-fat or skim milk - switch to sugar-free drinks - be open to making changes Follow Up Plan: Telephone follow up appointment with care management team member scheduled for: 04/07/21 at 3:30 PM The patient has been provided with contact information for the care management team and has been advised to call with any health related questions or concerns.       Care Plan : Osteoporosis (Adult)  Updates made by Dimitri Ped, RN since 01/27/2021 12:00 AM     Problem: Disease Progression (Osteoporosis)   Priority: Medium     Long-Range Goal: Disease Progression Prevented or Minimized   Start Date: 01/27/2021  Expected End Date: 06/23/2021  This Visit's Progress: On track  Priority: Medium  Note:   Current Barriers:  Ineffective Self Health Maintenance in a patient with  osteoporosis Unable to independently self manage osteoporosis States she takes her Fosamax weekly as directed.  States she did have a fall a month or so ago when she was playing basketball with her nephews and tripped and fell  Clinical Goal(s):  Collaboration with Midge Minium, MD regarding development and update of comprehensive plan of care as evidenced by provider attestation and co-signature Inter-disciplinary care team collaboration (see longitudinal plan of care) patient will work with care management team to address care coordination and chronic disease management needs related to Disease Management Educational Needs Care Coordination   Interventions:  Evaluation of current treatment plan related to  osteoporosis ,  self-management and patient's adherence to plan as established by provider. Collaboration with Midge Minium, MD regarding development and update of comprehensive plan of care as evidenced by provider attestation       and co-signature Inter-disciplinary care team collaboration (see longitudinal plan of care) Discussed plans with patient for ongoing care management  follow up and provided patient with direct contact information for care management team Reviewed home safety and fall precautions Reviewed to take Fosamax weekly as directed Reviewed importance of regular exercise and resistnace trainingto help keep bones strong and to help with balance Reviewed to take Calcium supplement as directed to include foods that are higher in calcium and Vitamin D Self Care Activities:  Self administers medications as prescribed Attends all scheduled provider appointments Calls pharmacy for medication refills Calls provider office for new concerns or questions Patient Goals: - add more outdoor lighting - always use handrails on the stairs - always wear shoes or slippers with non-slip sole - get at least 10 minutes of activity every day - keep a flashlight by the bed - keep cell phone with me always - make an emergency alert plan in case I fall - remove, or use a non-slip pad, with my throw rugs - use a nightlight in the bathroom Follow Up Plan: Telephone follow up appointment with care management team member scheduled for: 04/07/21 at 3:30 PM The patient has been provided with contact information for the care management team and has been advised to call with any health related  questions or concerns.       Plan:Telephone follow up appointment with care management team member scheduled for:  04/07/21 and The patient has been provided with contact information for the care management team and has been advised to call with any health related questions or concerns.  Peter Garter RN, BSN,CCM, CDE Care Management Coordinator [place] [phone], Mobile [phone]

## 2021-01-27 NOTE — Patient Instructions (Signed)
Visit Information   PATIENT GOALS:   Goals Addressed             This Visit's Progress    RNCM:Eat Healthy       Timeframe:  Long-Range Goal Priority:  High Start Date:       01/27/21                      Expected End Date:    06/23/21                   Follow Up Date 04/07/21    - change to whole grain breads, cereal, pasta - drink 6 to 8 glasses of water each day - eat fish at least once per week - fill half of plate with vegetables - limit fast food meals to no more than 1 per week - manage portion size - read food labels for fat, fiber, carbohydrates and portion size - reduce red meat to 2 to 3 times a week - switch to low-fat or skim milk - switch to sugar-free drinks    Why is this important?   When you are ready to manage your nutrition or weight, having a plan and setting goals will help.  Taking small steps to change how you eat and exercise is a good place to start.    Notes:      RNCM:Manage My Cholesterol       Timeframe:  Long-Range Goal Priority:  High Start Date:      01/27/21                       Expected End Date:   06/23/21                    Follow Up Date 04/07/21    - change to whole grain breads, cereal, pasta - eat smaller or less servings of red meat - fill half the plate with nonstarchy vegetables - get blood test (fasting) done 1 week before next visit - increase the amount of fiber in food - read food labels for fat and fiber - switch to low-fat or skim milk    Why is this important?   Changing cholesterol starts with eating heart-healthy foods.  Other steps may be to increase your activity and to quit if you smoke.    Notes:      RNCM:Prevent Falls and Broken Bones-Osteoporosis       Timeframe:  Long-Range Goal Priority:  Medium Start Date:   01/27/21                          Expected End Date:    06/23/21                   Follow Up Date 04/07/21    - add more outdoor lighting - always use handrails on the stairs - always wear  shoes or slippers with non-slip sole - get at least 10 minutes of activity every day - keep a flashlight by the bed - keep cell phone with me always - make an emergency alert plan in case I fall - remove, or use a non-slip pad, with my throw rugs - use a nightlight in the bathroom    Why is this important?   When you fall, there are 3 things that control if a bone breaks or not.  These are the  fall itself, how hard and the direction that you fall and how fragile your bones are.  Preventing falls is very important for you because of fragile bones.     Notes:       Osteoporosis Osteoporosis is when the bones get thin and weak. This can cause your bones to break (fracture) more easily. What are the causes? The exact cause of this condition is not known. What increases the risk? Having family members with this condition. Not eating enough healthy foods. Taking certain medicines. Being female. Being age 75 or older. Smoking or using other products that contain nicotine or tobacco, such as e-cigarettes or chewing tobacco. Not exercising. Being of European or Asian ancestry. Having a small body frame. What are the signs or symptoms? A broken bone might be the first sign, especially if the break results from a fall or injury that usually would not cause a bone to break. Other signs and symptoms include: Pain in the neck or low back. Being hunched over (stooped posture). Getting shorter. How is this treated? Eating more foods with more calcium and vitamin D in them. Doing exercises. Stopping tobacco use. Limiting how much alcohol you drink. Taking medicines to slow bone loss or help make the bones stronger. Taking supplements of calcium and vitamin D every day. Taking medicines to replace chemicals in the body (hormone replacement medicines). Monitoring your levels of calcium and vitamin D. The goal of treatment is to strengthen your bones and lower your risk for a bone  break. Follow these instructions at home: Eating and drinking Eat plenty of calcium and vitamin D. These nutrients are good for your bones. Good sources of calcium and vitamin D include: Some fish, such as salmon and tuna. Foods that have calcium and vitamin D added to them (fortified foods), such as some breakfast cereals. Egg yolks. Cheese. Liver.  Activity Do exercises as told by your doctor. Ask your doctor what exercises are safe for you. You should do: Exercises that make your muscles work to hold your body weight up (weight-bearing exercises). These include tai chi, yoga, and walking. Exercises to make your muscles stronger. One example is lifting weights. Lifestyle Do not drink alcohol if: Your doctor tells you not to drink. You are pregnant, may be pregnant, or are planning to become pregnant. If you drink alcohol: Limit how much you use to: 0-1 drink a day for women. 0-2 drinks a day for men. Know how much alcohol is in your drink. In the [place] one drink equals one 12 oz bottle of beer (355 mL), one 5 oz glass of wine (148 mL), or one 1 oz glass of hard liquor (44 mL). Do not smoke or use any products that contain nicotine or tobacco. If you need help quitting, ask your doctor. Preventing falls Use tools to help you move around (mobility aids) as needed. These include canes, walkers, scooters, and crutches. Keep rooms well-lit. Put away things on the floor that could make you trip. These include cords and rugs. Install safety rails on stairs. Install grab bars in bathrooms. Use rubber mats in slippery areas, like bathrooms. Wear shoes that: Fit you well. Support your feet. Have closed toes. Have rubber soles or low heels. Tell your doctor about all of the medicines you are taking. Some medicines can make you more likely to fall. General instructions Take over-the-counter and prescription medicines only as told by your doctor. Keep all follow-up visits. Contact a  doctor if: You have not  been tested (screened) for osteoporosis and you are: A woman who is age 48 or older. A man who is age 60 or older. Get help right away if: You fall. You get hurt. Summary Osteoporosis happens when your bones get thin and weak. Weak bones can break (fracture) more easily. Eat plenty of calcium and vitamin D. These are good for your bones. Tell your doctor about all of the medicines that you take. This information is not intended to replace advice given to you by your health care provider. Make sure you discuss any questions you have with your health care provider. Document Revised: 09/26/2019 Document Reviewed: 09/26/2019 Elsevier Patient Education  2022 [employer]. Dyslipidemia Dyslipidemia is an imbalance of waxy, fat-like substances (lipids) in the blood. The body needs lipids in small amounts. Dyslipidemia often involves a high level of cholesterol or triglycerides, which are types of lipids. Common forms of dyslipidemia include: High levels of LDL cholesterol. LDL is the type of cholesterol that causes fatty deposits (plaques) to build up in the blood vessels that carry blood away from the heart (arteries). Low levels of HDL cholesterol. HDL cholesterol is the type of cholesterol that protects against heart disease. High levels of HDL remove the LDL buildup from arteries. High levels of triglycerides. Triglycerides are a fatty substance in the blood that is linked to a buildup of plaques in the arteries. What are the causes? There are two main types of dyslipidemia: primary and secondary. Primary dyslipidemia is caused by changes (mutations) in genes that are passed down through families (inherited). These mutations cause several types of dyslipidemia. Secondary dyslipidemia may be caused by various risk factors that can lead to the disease, such as lifestyle choices and certain medical conditions. What increases the risk? You are more likely to develop this  condition if you are an older man or if you are a woman who has gone through menopause. Other risk factors include: Having a family history of dyslipidemia. Taking certain medicines, including birth control pills, steroids, some diuretics, and beta-blockers. Eating a diet high in saturated fat. Smoking cigarettes or excessive alcohol intake. Having certain medical conditions such as diabetes, polycystic ovary syndrome (PCOS), kidney disease, liver disease, or hypothyroidism. Not exercising regularly. Being overweight or obese with too much belly fat. What are the signs or symptoms? In most cases, dyslipidemia does not usually cause any symptoms. In severe cases, very high lipid levels can cause: Fatty bumps under the skin (xanthomas). A white or gray ring around the black center (pupil) of the eye. Very high triglyceride levels can cause inflammation of the pancreas (pancreatitis). How is this diagnosed? Your health care provider may diagnose dyslipidemia based on a routine blood test (fasting blood test). Because most people do not have symptoms of the condition, this blood testing (lipid profile) is done on adults age 41 and older and is repeated every 4-6 years. This test checks: Total cholesterol. This measures the total amount of cholesterol in your blood, including LDL cholesterol, HDL cholesterol, and triglycerides. A healthy number is below 200 mg/dL (5.17 mmol/L). LDL cholesterol. The target number for LDL cholesterol is different for each person, depending on individual risk factors. A healthy number is usually below 100 mg/dL (2.59 mmol/L). Ask your health care provider what your LDL cholesterol should be. HDL cholesterol. An HDL level of 60 mg/dL (1.55 mmol/L) or higher is best because it helps to protect against heart disease. A number below 40 mg/dL (1.03 mmol/L) for men or below 50  mg/dL (1.29 mmol/L) for women increases the risk for heart disease. Triglycerides. A healthy  triglyceride number is below 150 mg/dL (1.69 mmol/L). If your lipid profile is abnormal, your health care provider may do other blood tests. How is this treated? Treatment depends on the type of dyslipidemia that you have and your other risk factors for heart disease and stroke. Your health care provider will have a target range for your lipid levels based on this information. Treatment for dyslipidemia starts with lifestyle changes, such as diet and exercise. Your health care provider may recommend that you: Get regular exercise. Make changes to your diet. Quit smoking if you smoke. Limit your alcohol intake. If diet changes and exercise do not help you reach your goals, your health care provider may also prescribe medicine to lower lipids. The most commonly prescribed type of medicine lowers your LDL cholesterol (statin drug). If you have a high triglyceride level, your provider may prescribe another type of drug (fibrate) or an omega-3 fish oil supplement, or both. Follow these instructions at home: Eating and drinking  Follow instructions from your health care provider or dietitian about eating or drinking restrictions. Eat a healthy diet as told by your health care provider. This can help you reach and maintain a healthy weight, lower your LDL cholesterol, and raise your HDL cholesterol. This may include: Limiting your calories, if you are overweight. Eating more fruits, vegetables, whole grains, fish, and lean meats. Limiting saturated fat, trans fat, and cholesterol. Do not drink alcohol if: Your health care provider tells you not to drink. You are pregnant, may be pregnant, or are planning to become pregnant. If you drink alcohol: Limit how much you have to: 0-1 drink a day for women. 0-2 drinks a day for men. Know how much alcohol is in your drink. In the [place] one drink equals one 12 oz bottle of beer (355 mL), one 5 oz glass of wine (148 mL), or one 1 oz glass of hard liquor (44  mL). Activity Get regular exercise. Start an exercise and strength training program as told by your health care provider. Ask your health care provider what activities are safe for you. Your health care provider may recommend: 30 minutes of aerobic activity 4-6 days a week. Brisk walking is an example of aerobic activity. Strength training 2 days a week. General instructions Do not use any products that contain nicotine or tobacco. These products include cigarettes, chewing tobacco, and vaping devices, such as e-cigarettes. If you need help quitting, ask your health care provider. Take over-the-counter and prescription medicines only as told by your health care provider. This includes supplements. Keep all follow-up visits. This is important. Contact a health care provider if: You are having trouble sticking to your exercise or diet plan. You are struggling to quit smoking or to control your use of alcohol. Summary Dyslipidemia often involves a high level of cholesterol or triglycerides, which are types of lipids. Treatment depends on the type of dyslipidemia that you have and your other risk factors for heart disease and stroke. Treatment for dyslipidemia starts with lifestyle changes, such as diet and exercise. Your health care provider may prescribe medicine to lower lipids. This information is not intended to replace advice given to you by your health care provider. Make sure you discuss any questions you have with your health care provider. Document Revised: 06/15/2020 Document Reviewed: 06/15/2020 Elsevier Patient Education  2022 [employer].   Consent to CCM Services: Monica Diaz was given  information about Chronic Care Management services including:  CCM service includes personalized support from designated clinical staff supervised by her physician, including individualized plan of care and coordination with other care providers 24/7 contact phone numbers for assistance for urgent  and routine care needs. Service will only be billed when office clinical staff spend 20 minutes or more in a month to coordinate care. Only one practitioner may furnish and bill the service in a calendar month. The patient may stop CCM services at any time (effective at the end of the month) by phone call to the office staff. The patient will be responsible for cost sharing (co-pay) of up to 20% of the service fee (after annual deductible is met).  Patient agreed to services and verbal consent obtained.   Patient verbalizes understanding of instructions provided today and agrees to view in [place].   Telephone follow up appointment with care management team member scheduled for: 04/07/21 at 3:30 PM  [place], [facility], CDE Care Management Coordinator [facility] [phone], Mobile [phone]   CLINICAL CARE PLAN: Patient Care Plan: Hyperlipidemia     Problem Identified: Lack of self management of hyperlipidemia   Priority: High     Long-Range Goal: Effective self mangement of Hyperlipidemia   Start Date: 01/27/2021  Expected End Date: 06/23/2021  This Visit's Progress: On track  Priority: High  Note:   Current Barriers:  Poorly controlled hyperlipidemia, complicated by hypotension, osteoporosis  Current antihyperlipidemic regimen: red yeast rice Most recent lipid panel:     Component Value Date/Time   CHOL 176 01/26/2021 1157   TRIG 67.0 01/26/2021 1157   HDL 67.40 01/26/2021 1157   CHOLHDL 3 01/26/2021 1157   VLDL 13.4 01/26/2021 1157   LDLCALC 95 01/26/2021 1157   LDLDIRECT 163.8 04/11/2011 1423   ASCVD risk enhancing conditions: age >59 Unable to independently self manage hyperlipidemia States that since her Mother has moved in with her she has not been eating as health especially for breakfast and she has not been [facility] to [facility].  States she is trying to drink more water to help keep her B/P higher.  States she is  coping with having her Mother move in with her at this time [place]):  patient will work with Consulting civil engineer, providers, and care team towards execution of optimized self-health management plan patient will verbalize understanding of plan for self management of hyperlipidemia patient will attend all scheduled medical appointments: Dr. Birdie Riddle 03/24/21 patient will demonstrate improved adherence to prescribed treatment plan for self management of hyperlipidemia the patient will demonstrate ongoing self health care management ability Interventions: Collaboration with Midge Minium, MD regarding development and update of comprehensive plan of care as evidenced by provider attestation and co-signature Inter-disciplinary care team collaboration (see longitudinal plan of care) Medication review performed; medication list updated in electronic medical record.  Inter-disciplinary care team collaboration (see longitudinal plan of care) Evaluation of current treatment plan related to self management of hyperlipidemia and patient's adherence to plan as established by provider. Provided education to patient re: self management of hyperlipidemia Provided patient with verbal and written educational materials related to self management of hyperlipidemia Discussed plans with patient for ongoing care management follow up and provided patient with direct contact information for care management team Reviewed importance of exercise to help control lipid levels and encouraged to walk around her neighborhood if she is unable to go to the [facility] Reviewed to avoid saturated  fats, trans-fats and eat more fiber Reviewed to take red yeast rice as directed Reviewed to lower fatty foods, red meat, cheese, milk and increase fiber like whole grains and vegetables. Reviewed to take care of herself and that LCSW is available to help her with caregiver stress if needed in the future Patient  Goals/Self-Care Activities: - change to whole grain breads, cereal, pasta - drink 6 to 8 glasses of water each day - eat 3 to 5 servings of fruits and vegetables each day - eat fish at least once per week - fill half the plate with nonstarchy vegetables - limit fast food meals to no more than 1 per week - manage portion size - read food labels for fat, fiber, carbohydrates and portion size - reduce red meat to 2 to 3 times a week - switch to low-fat or skim milk - switch to sugar-free drinks - be open to making changes Follow Up Plan: Telephone follow up appointment with care management team member scheduled for: 04/07/21 at 3:30 PM The patient has been provided with contact information for the care management team and has been advised to call with any health related questions or concerns.       Patient Care Plan: Osteoporosis (Adult)     Problem Identified: Disease Progression (Osteoporosis)   Priority: Medium     Long-Range Goal: Disease Progression Prevented or Minimized   Start Date: 01/27/2021  Expected End Date: 06/23/2021  This Visit's Progress: On track  Priority: Medium  Note:   Current Barriers:  Ineffective Self Health Maintenance in a patient with  osteoporosis Unable to independently self manage osteoporosis States she takes her Fosamax weekly as directed.  States she did have a fall a month or so ago when she was playing basketball with her nephews and tripped and fell  Clinical Goal(s):  Collaboration with Midge Minium, MD regarding development and update of comprehensive plan of care as evidenced by provider attestation and co-signature Inter-disciplinary care team collaboration (see longitudinal plan of care) patient will work with care management team to address care coordination and chronic disease management needs related to Disease Management Educational Needs Care Coordination   Interventions:  Evaluation of current treatment plan related to   osteoporosis ,  self-management and patient's adherence to plan as established by provider. Collaboration with Midge Minium, MD regarding development and update of comprehensive plan of care as evidenced by provider attestation       and co-signature Inter-disciplinary care team collaboration (see longitudinal plan of care) Discussed plans with patient for ongoing care management follow up and provided patient with direct contact information for care management team Reviewed home safety and fall precautions Reviewed to take Fosamax weekly as directed Reviewed importance of regular exercise and resistnace trainingto help keep bones strong and to help with balance Reviewed to take Calcium supplement as directed to include foods that are higher in calcium and Vitamin D Self Care Activities:  Self administers medications as prescribed Attends all scheduled provider appointments Calls pharmacy for medication refills Calls provider office for new concerns or questions Patient Goals: - add more outdoor lighting - always use handrails on the stairs - always wear shoes or slippers with non-slip sole - get at least 10 minutes of activity every day - keep a flashlight by the bed - keep cell phone with me always - make an emergency alert plan in case I fall - remove, or use a non-slip pad, with my throw rugs - use  a nightlight in the bathroom Follow Up Plan: Telephone follow up appointment with care management team member scheduled for: 04/07/21 at 3:30 PM The patient has been provided with contact information for the care management team and has been advised to call with any health related questions or concerns.

## 2021-01-29 ENCOUNTER — Other Ambulatory Visit: Payer: Self-pay

## 2021-01-29 ENCOUNTER — Ambulatory Visit
Admission: RE | Admit: 2021-01-29 | Discharge: 2021-01-29 | Disposition: A | Discharge status: Home / Self Care | Payer: Medicare PPO | Source: Ambulatory Visit | Attending: Family Medicine | Admitting: Family Medicine

## 2021-01-29 DIAGNOSIS — M8589 Other specified disorders of bone density and structure, multiple sites: Secondary | ICD-10-CM | POA: Diagnosis not present

## 2021-01-29 DIAGNOSIS — Z780 Asymptomatic menopausal state: Secondary | ICD-10-CM | POA: Diagnosis not present

## 2021-02-15 ENCOUNTER — Other Ambulatory Visit: Payer: Self-pay | Admitting: Family Medicine

## 2021-02-22 DIAGNOSIS — E039 Hypothyroidism, unspecified: Secondary | ICD-10-CM | POA: Diagnosis not present

## 2021-02-22 DIAGNOSIS — M818 Other osteoporosis without current pathological fracture: Secondary | ICD-10-CM | POA: Diagnosis not present

## 2021-02-22 DIAGNOSIS — C50411 Malignant neoplasm of upper-outer quadrant of right female breast: Secondary | ICD-10-CM | POA: Diagnosis not present

## 2021-02-22 DIAGNOSIS — Z170 Estrogen receptor positive status [ER+]: Secondary | ICD-10-CM

## 2021-02-22 DIAGNOSIS — E785 Hyperlipidemia, unspecified: Secondary | ICD-10-CM

## 2021-03-16 ENCOUNTER — Other Ambulatory Visit: Payer: Self-pay | Admitting: Family Medicine

## 2021-03-16 DIAGNOSIS — M8000XG Age-related osteoporosis with current pathological fracture, unspecified site, subsequent encounter for fracture with delayed healing: Secondary | ICD-10-CM

## 2021-03-24 ENCOUNTER — Encounter: Payer: Self-pay | Admitting: Family Medicine

## 2021-03-24 ENCOUNTER — Ambulatory Visit (INDEPENDENT_AMBULATORY_CARE_PROVIDER_SITE_OTHER): Payer: Medicare PPO | Admitting: Family Medicine

## 2021-03-24 VITALS — BP 112/72 | HR 67 | Temp 97.9°F | Resp 16 | Ht 64.0 in | Wt 163.2 lb

## 2021-03-24 DIAGNOSIS — Z0000 Encounter for general adult medical examination without abnormal findings: Secondary | ICD-10-CM

## 2021-03-24 DIAGNOSIS — R350 Frequency of micturition: Secondary | ICD-10-CM | POA: Diagnosis not present

## 2021-03-24 DIAGNOSIS — M818 Other osteoporosis without current pathological fracture: Secondary | ICD-10-CM | POA: Diagnosis not present

## 2021-03-24 DIAGNOSIS — G8929 Other chronic pain: Secondary | ICD-10-CM | POA: Diagnosis not present

## 2021-03-24 DIAGNOSIS — H918X9 Other specified hearing loss, unspecified ear: Secondary | ICD-10-CM

## 2021-03-24 DIAGNOSIS — E785 Hyperlipidemia, unspecified: Secondary | ICD-10-CM | POA: Diagnosis not present

## 2021-03-24 DIAGNOSIS — M25511 Pain in right shoulder: Secondary | ICD-10-CM | POA: Diagnosis not present

## 2021-03-24 LAB — POCT URINALYSIS DIPSTICK
Bilirubin, UA: NEGATIVE
Blood, UA: NEGATIVE
Glucose, UA: NEGATIVE
Ketones, UA: NEGATIVE
Leukocytes, UA: NEGATIVE
Nitrite, UA: NEGATIVE
Protein, UA: NEGATIVE
Urobilinogen, UA: 0.2 E.U./dL
pH, UA: 6 (ref 5.0–8.0)

## 2021-03-24 NOTE — Assessment & Plan Note (Signed)
Chronic problem.  Attempting to control w/ Red Yeast Rice, diet and exercise.  Check labs.  Adjust tx plan if needed.  Pt expressed understanding and is in agreement w/ plan.

## 2021-03-24 NOTE — Progress Notes (Signed)
Subjective:    Patient ID: Monica Diaz, female    DOB: 26-May-1940, 80 y.o.   MRN: [id number]  HPI CPE- UTD on colonoscopy, mammo, DEXA, Tdap, PNA, flu vaccines.  Patient Care Team    Relationship Specialty Notifications Start End  Midge Minium, MD PCP - General   04/07/10    Comment: Jones Skene, Asencion Partridge, MD Consulting Physician Neurology  05/22/13   Juanita Craver, MD Consulting Physician Gastroenterology  08/27/15   Magrinat, Virgie Dad, MD Consulting Physician Oncology  09/14/16   Roseanne Kaufman, MD Consulting Physician Orthopedic Surgery  09/14/16   Dimitri Ped, RN Case Manager   01/05/21    Comment: [phone]    Health Maintenance  Topic Date Due   Hepatitis C Screening  Never done   COVID-19 Vaccine (5 - Booster for Pfizer series) 10/19/2020   Zoster Vaccines- Shingrix (2 of 2) 01/24/2021   MAMMOGRAM  06/15/2021   TETANUS/TDAP  03/18/2024   Pneumonia Vaccine 52+ Years old  Completed   INFLUENZA VACCINE  Completed   DEXA SCAN  Completed   HPV VACCINES  Aged Out      Review of Systems Patient reports no vision changes, adenopathy,fever, weight change,  persistant/recurrent hoarseness , swallowing issues, chest pain, palpitations, edema, persistant/recurrent cough, hemoptysis, dyspnea (rest/exertional/paroxysmal nocturnal), gastrointestinal bleeding (melena, rectal bleeding), abdominal pain, significant heartburn, bowel changes, Gyn symptoms (abnormal  bleeding, pain),  syncope, focal weakness, memory loss, numbness & tingling, skin/hair/nail changes, abnormal bruising or bleeding, anxiety, or depression.   + urinary frequency- no leakage but does note that she has to urinate every 30ish minutes after drinking fluids.  No pain.  Sxs started 'a couple of months ago'. R shoulder pain- going on 'for quite awhile'.  Notes previous injury when walking daughter's dog. + hearing loss  This visit occurred during the SARS-CoV-2 public health emergency.  Safety  protocols were in place, including screening questions prior to the visit, additional usage of staff PPE, and extensive cleaning of exam room while observing appropriate contact time as indicated for disinfecting solutions.      Objective:   Physical Exam General Appearance:    Alert, cooperative, no distress, appears stated age  Head:    Normocephalic, without obvious abnormality, atraumatic  Eyes:    PERRL, conjunctiva/corneas clear, EOM's intact, fundi    benign, both eyes  Ears:    Normal TM's and external ear canals, both ears  Nose:   Deferred due to COVID  Throat:   Neck:   Supple, symmetrical, trachea midline, no adenopathy;    Thyroid: no enlargement/tenderness/nodules  Back:     Symmetric, no curvature, ROM normal, no CVA tenderness  Lungs:     Clear to auscultation bilaterally, respirations unlabored  Chest Wall:    No tenderness or deformity   Heart:    Regular rate and rhythm, S1 and S2 normal, no murmur, rub   or gallop  Breast Exam:    Deferred to GYN  Abdomen:     Soft, non-tender, bowel sounds active all four quadrants,    no masses, no organomegaly  Genitalia:    Deferred to GYN  Rectal:    Extremities:   Extremities normal, atraumatic, no cyanosis or edema  Pulses:   2+ and symmetric all extremities  Skin:   Skin color, texture, turgor normal, no rashes or lesions  Lymph nodes:   Cervical, supraclavicular, and axillary nodes normal  Neurologic:   CNII-XII intact, normal strength, sensation and reflexes  throughout          Assessment & Plan:

## 2021-03-24 NOTE — Patient Instructions (Addendum)
Follow up in 1 year or as needed No need for labs today- everything looked great! I think the frequency is weakness of the bladder muscle w/ age, but we'll make sure! We'll call you with your ENT and Orthopedic appts Call with any questions or concerns Stay Safe!  Stay Healthy! Happy Holidays!!!

## 2021-03-24 NOTE — Assessment & Plan Note (Signed)
UTD on DEXA.  Check Vit D level and replete prn. 

## 2021-03-24 NOTE — Assessment & Plan Note (Signed)
Pt's PE WNL.  UTD on mammo, colonoscopy, DEX, Tdap, PNA, flu, and shingles.  Check labs.  Anticipatory guidance provided.

## 2021-03-31 ENCOUNTER — Encounter: Payer: Self-pay | Admitting: Orthopaedic Surgery

## 2021-03-31 ENCOUNTER — Ambulatory Visit (INDEPENDENT_AMBULATORY_CARE_PROVIDER_SITE_OTHER): Payer: Medicare PPO | Admitting: Orthopaedic Surgery

## 2021-03-31 ENCOUNTER — Ambulatory Visit (INDEPENDENT_AMBULATORY_CARE_PROVIDER_SITE_OTHER): Payer: Medicare PPO

## 2021-03-31 ENCOUNTER — Other Ambulatory Visit: Payer: Self-pay

## 2021-03-31 DIAGNOSIS — M25511 Pain in right shoulder: Secondary | ICD-10-CM

## 2021-03-31 DIAGNOSIS — G8929 Other chronic pain: Secondary | ICD-10-CM | POA: Diagnosis not present

## 2021-03-31 NOTE — Progress Notes (Signed)
Office Visit Note   Patient: Monica Diaz           Date of Birth: [birth year]           MRN: [id number] Visit Date: 03/31/2021              Requested by: Midge Minium, MD [address] [place],  [place] [id number] PCP: Midge Minium, MD   Assessment & Plan: Visit Diagnoses: No diagnosis found.  Plan: Patient with a 2-year history of right shoulder pain that she describes mostly as aching.  She thinks this may have started while she was recovering from a lumpectomy on her right breast and she may have overdone it.  She reports specific incidence of lifting some books and a dog pulling on her arm.  She denies any weakness or any difficulty currently sleeping.  She is right-handed.  Certainly she was given the options of getting an MRI of the right shoulder versus a steroid injection versus physical therapy or home exercises.  She does say for now she would just like to try exercises and we provided her sheets describing these.  If she changes her mind on either injection or MRI she can simply contact our office  Follow-Up Instructions: No follow-ups on file.   Orders:  No orders of the defined types were placed in this encounter.  No orders of the defined types were placed in this encounter.     Procedures: No procedures performed   Clinical Data: No additional findings.   Subjective: Chief Complaint  Patient presents with   Right Shoulder - Pain  Patient presents today for right shoulder pain. She said that she has pain on and off for about two years, but it happening more frequently. Her pain is in the shoulder and proximal arm. She is right hand dominant. She is not taking anything for pain.    Review of Systems  All other systems reviewed and are negative.   Objective: Vital Signs: There were no vitals taken for this visit.  Physical Exam Constitutional:      Appearance: Normal appearance.  Pulmonary:     Effort: Pulmonary effort is  normal.  Skin:    General: Skin is warm and dry.  Neurological:     General: No focal deficit present.     Mental Status: She is alert.    Ortho Exam Examination of her right shoulder demonstrates easy fluid range of motion for forward elevation above her head and behind her back.  She has very mild findings of impingement with empty can test.  She does reproduce some pain anteriorly with speeds testing but only with the testing.  She has excellent strength with resisted external/internal rotation and abduction Specialty Comments:  No specialty comments available.  Imaging: No results found.   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   Other neutropenia ([place]) 05/13/2011   General medical examination 05/13/2011   BUNION 03/25/2010   Hypothyroidism 12/02/2009   BENIGN NEOPLASM OF SKIN SITE UNSPECIFIED 02/10/2009   Vitamin D deficiency 02/10/2009   Hyperlipidemia  12/22/2008   RHINITIS 12/22/2008   NECK PAIN 12/22/2008   Osteoporosis 12/22/2008   VERTIGO 12/22/2008   Past Medical History:  Diagnosis Date   Arthritis    Breast cancer ([place]) 2015   Cancer ([place]) 2015   breast   HOH (hard of hearing)    Hx of radiation therapy 07/18/13-08/14/13   right breast total dose 50 Gy   Hyperlipidemia    Osteoporosis    Personal history of radiation therapy 2015   Seasonal allergies    Sleep apnea    uses a cpap   Thyroid disease    Wears glasses     Family History  Problem Relation Age of Onset   Hypertension Mother    Diabetes Maternal Aunt    Stroke Other         grandfather   Breast cancer Neg Hx     Past Surgical History:  Procedure Laterality Date   ABDOMINAL HYSTERECTOMY     fibroids   BREAST BIOPSY Right 2015   core    BREAST BIOPSY Left 06/18/2018   DENSE FIBROSIS WITH CALCIFICATIONS AND CHRONIC   BREAST BIOPSY Left 06/07/2017   BREAST EXCISIONAL BIOPSY Right 2005   BREAST LUMPECTOMY Right    COLONOSCOPY     LEFT OOPHORECTOMY     PARTIAL MASTECTOMY WITH NEEDLE LOCALIZATION AND AXILLARY SENTINEL LYMPH NODE BX Right 05/28/2013   Procedure: PARTIAL MASTECTOMY WITH NEEDLE LOCALIZATION AND AXILLARY SENTINEL LYMPH NODE BIOPSY;  Surgeon: Adin Hector, MD;  Location: [facility];  Service: General;  Laterality: Right;   Social History   Occupational History    Employer: RETIRED  Tobacco Use   Smoking status: Never   Smokeless tobacco: Never  Vaping Use   Vaping Use: Never used  Substance and Sexual Activity   Alcohol use: Yes    Comment: occasionally   Drug use: No   Sexual activity: Not on file

## 2021-04-07 ENCOUNTER — Ambulatory Visit (INDEPENDENT_AMBULATORY_CARE_PROVIDER_SITE_OTHER): Payer: Medicare PPO

## 2021-04-07 DIAGNOSIS — E785 Hyperlipidemia, unspecified: Secondary | ICD-10-CM

## 2021-04-07 DIAGNOSIS — M818 Other osteoporosis without current pathological fracture: Secondary | ICD-10-CM

## 2021-04-07 NOTE — Patient Instructions (Addendum)
Visit Information  Thank you for taking time to visit with me today. Please don't hesitate to contact me if I can be of assistance to you before our next scheduled telephone appointment. Kegel Exercises Kegel exercises can help strengthen your pelvic floor muscles. The pelvic floor is a group of muscles that support your rectum, small intestine, and bladder. In females, pelvic floor muscles also help support the uterus. These muscles help you control the flow of urine and stool (feces). Kegel exercises are painless and simple. They do not require any equipment. Your provider may suggest Kegel exercises to: Improve bladder and bowel control. Improve sexual response. Improve weak pelvic floor muscles after surgery to remove the uterus (hysterectomy) or after pregnancy, in females. Improve weak pelvic floor muscles after prostate gland removal or surgery, in males. Kegel exercises involve squeezing your pelvic floor muscles. These are the same muscles you squeeze when you try to stop the flow of urine or keep from passing gas. The exercises can be done while sitting, standing, or lying down, but it is best to vary your position. Ask your health care provider which exercises are safe for you. Do exercises exactly as told by your health care provider and adjust them as directed. Do not begin these exercises until told by your health care provider. Exercises How to do Kegel exercises: Squeeze your pelvic floor muscles tight. You should feel a tight lift in your rectal area. If you are a female, you should also feel a tightness in your vaginal area. Keep your stomach, buttocks, and legs relaxed. Hold the muscles tight for up to 10 seconds. Breathe normally. Relax your muscles for up to 10 seconds. Repeat as told by your health care provider. Repeat this exercise daily as told by your health care provider. Continue to do this exercise for at least 4-6 weeks, or for as long as told by your health care  provider. You may be referred to a physical therapist who can help you learn more about how to do Kegel exercises. Depending on your condition, your health care provider may recommend: Varying how long you squeeze your muscles. Doing several sets of exercises every day. Doing exercises for several weeks. Making Kegel exercises a part of your regular exercise routine. This information is not intended to replace advice given to you by your health care provider. Make sure you discuss any questions you have with your health care provider. Document Revised: 08/20/2020 Document Reviewed: 08/20/2020 Elsevier Patient Education  2022 [place] are the goals we discussed today:  Take all medications as prescribed Attend all scheduled provider appointments Call pharmacy for medication refills 3-7 days in advance of running out of medications Attend church or other social activities Perform all self care activities independently  Perform IADL's (shopping, preparing meals, housekeeping, managing finances) independently Call provider office for new concerns or questions  call for medicine refill 2 or 3 days before it runs out take all medications exactly as prescribed develop an exercise routine  Our next appointment is by telephone on 06/23/21 at 3 PM  Please call the care guide team at [phone] if you need to cancel or reschedule your appointment.   If you are experiencing a Mental Health or [place] or need someone to talk to, please call the Suicide and Crisis Lifeline: 988 call 1-800-273-TALK (toll free, 24 hour hotline) go to [facility] Urgent [facility] Manuel Garcia [phone]) call 911   Patient verbalizes understanding  of instructions provided today and agrees to view in [place].  Peter Garter RN, BSN,CCM, CDE Care Management Coordinator [place] [phone], Mobile [phone]

## 2021-04-07 NOTE — Chronic Care Management (AMB) (Signed)
Chronic Care Management   CCM RN Visit Note  04/07/2021 Name: Monica Diaz MRN: [id number] DOB: 1940/05/17  Subjective: Monica Diaz is a 80 y.o. year old female who is a primary care patient of Birdie Riddle, Aundra Millet, MD. The care management team was consulted for assistance with disease management and care coordination needs.    Engaged with patient by telephone for follow up visit in response to provider referral for case management and/or care coordination services.   Consent to Services:  The patient was given information about Chronic Care Management services, agreed to services, and gave verbal consent prior to initiation of services.  Please see initial visit note for detailed documentation.   Patient agreed to services and verbal consent obtained.   Assessment: Review of patient past medical history, allergies, medications, health status, including review of consultants reports, laboratory and other test data, was performed as part of comprehensive evaluation and provision of chronic care management services.   SDOH (Social Determinants of Health) assessments and interventions performed:    CCM Care Plan  Allergies  Allergen Reactions   Codeine     REACTION: sick to stomach    Outpatient Encounter Medications as of 04/07/2021  Medication Sig   alendronate (FOSAMAX) 70 MG tablet TAKE 1 TABLET BY MOUTH ONCE PER WEEK WITH A FULL GLASS OF WATER ON AN EMPTY STOMACH   ARMOUR THYROID 60 MG tablet TAKE 1 TABLET BY MOUTH DAILY BEFORE BREAKFAST   azelastine (ASTELIN) 0.1 % nasal spray Place into both nostrils 2 (two) times daily. Use in each nostril as directed   calcium citrate-vitamin D (CITRACAL+D) 315-200 MG-UNIT per tablet Take 1 tablet by mouth 2 (two) times daily.   Coenzyme Q10 (COQ10) 200 MG CAPS Take 1 capsule by mouth at bedtime.   loratadine (CLARITIN) 10 MG tablet Take 10 mg by mouth daily as needed.    mometasone (NASONEX) 50 MCG/ACT nasal spray Place 2  sprays into the nose daily as needed.   Multiple Vitamins-Minerals (CENTRUM SILVER PO) Take 1 tablet by mouth.   Multiple Vitamins-Minerals (ZINC PO) Take by mouth.   Propylene Glycol (SYSTANE COMPLETE OP) Apply to eye.   pyridoxine (B-6) 100 MG tablet Take 100 mg by mouth 2 (two) times daily.   Red Yeast Rice 500 MG/0.5GM POWD Take by mouth.   vitamin B-12 (CYANOCOBALAMIN) 50 MCG tablet Take 50 mcg by mouth.   vitamin C (ASCORBIC ACID) 500 MG tablet Take 500 mg by mouth 2 (two) times daily.   No facility-administered encounter medications on file as of 04/07/2021.    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   Other neutropenia ([place]) 05/13/2011   General medical examination 05/13/2011   BUNION 03/25/2010   Hypothyroidism 12/02/2009   BENIGN NEOPLASM OF SKIN SITE UNSPECIFIED 02/10/2009   Vitamin D deficiency 02/10/2009   Hyperlipidemia 12/22/2008   RHINITIS 12/22/2008   NECK PAIN 12/22/2008   Osteoporosis 12/22/2008   VERTIGO 12/22/2008    Conditions to be addressed/monitored:HLD and Osteoporosis  Care Plan : Hyperlipidemia  Updates made by Dimitri Ped, RN since 04/07/2021 12:00 AM  Completed  04/07/2021   Problem: Lack of self management of hyperlipidemia Resolved 04/07/2021  Priority: High     Long-Range Goal: Effective self mangement of Hyperlipidemia Completed 04/07/2021  Start Date: 01/27/2021  Expected End Date: 06/23/2021  Recent Progress: On track  Priority: High  Note:   Resolving due to duplicate goal  Current Barriers:  Poorly controlled hyperlipidemia,  complicated by hypotension, osteoporosis  Current antihyperlipidemic regimen: red yeast rice Most recent lipid panel:     Component Value Date/Time   CHOL 176 01/26/2021 1157   TRIG 67.0 01/26/2021 1157   HDL 67.40 01/26/2021 1157   CHOLHDL 3 01/26/2021 1157   VLDL 13.4 01/26/2021 1157   LDLCALC 95 01/26/2021 1157   LDLDIRECT 163.8 04/11/2011 1423   ASCVD risk enhancing conditions: age >86 Unable to independently self manage hyperlipidemia States that since her Mother has moved in with her she has not been eating as health especially for breakfast and she has not been going to the [facility] to exercise.  States she is trying to drink more water to help keep her B/P higher.  States she is coping with having her Mother move in with her at this time [place]):  patient will work with Consulting civil engineer, providers, and care team towards execution of optimized self-health management plan patient will verbalize understanding of plan for self management of hyperlipidemia patient will attend all scheduled medical appointments: Dr. Birdie Riddle 03/24/21 patient will demonstrate improved adherence to prescribed treatment plan for self management of hyperlipidemia the patient will demonstrate ongoing self health care management ability Interventions: Collaboration with Midge Minium, MD regarding development and update of comprehensive plan of care as evidenced by provider attestation and co-signature Inter-disciplinary care team collaboration (see longitudinal plan of care) Medication review performed; medication list updated in electronic medical record.  Inter-disciplinary care team collaboration (see longitudinal plan of care) Evaluation of current treatment plan related to self management of hyperlipidemia and patient's adherence to plan as established by provider. Provided education to patient re: self management of hyperlipidemia Provided patient with verbal and written  educational materials related to self management of hyperlipidemia Discussed plans with patient for ongoing care management follow up and provided patient with direct contact information for care management team Reviewed importance of exercise to help control lipid levels and encouraged to walk around her neighborhood if she is unable to go to the [facility] Reviewed to avoid saturated fats, trans-fats and eat more fiber Reviewed to take red yeast rice as directed Reviewed to lower fatty foods, red meat, cheese, milk and increase fiber like whole grains and vegetables. Reviewed to take care of herself and that LCSW is available to help her with caregiver stress if needed in the future Patient Goals/Self-Care Activities: - change to whole grain breads, cereal, pasta - drink 6 to 8 glasses of water each day - eat 3 to 5 servings of fruits and vegetables each day - eat fish at least once per week - fill half the plate with nonstarchy vegetables - limit fast food meals to no more than 1 per week - manage portion size - read food labels for fat, fiber, carbohydrates and portion size - reduce red meat to 2 to 3 times a week - switch to low-fat or skim milk - switch to sugar-free drinks - be open to making changes Follow Up Plan: Telephone follow up appointment with care management team member scheduled for: 04/07/21 at 3:30 PM The patient has been provided with contact  information for the care management team and has been advised to call with any health related questions or concerns.       Care Plan : Osteoporosis (Adult)  Updates made by Dimitri Ped, RN since 04/07/2021 12:00 AM  Completed 04/07/2021   Problem: Disease Progression (Osteoporosis) Resolved 04/07/2021  Priority: Medium     Long-Range Goal: Disease Progression Prevented or Minimized Completed 04/07/2021  Start Date: 01/27/2021  Expected End Date: 06/23/2021  Recent Progress: On track  Priority: Medium  Note:   Resolving  due to duplicate goal  Current Barriers:  Ineffective Self Health Maintenance in a patient with  osteoporosis Unable to independently self manage osteoporosis States she takes her Fosamax weekly as directed.  States she did have a fall a month or so ago when she was playing basketball with her nephews and tripped and fell  Clinical Goal(s):  Collaboration with Midge Minium, MD regarding development and update of comprehensive plan of care as evidenced by provider attestation and co-signature Inter-disciplinary care team collaboration (see longitudinal plan of care) patient will work with care management team to address care coordination and chronic disease management needs related to Disease Management Educational Needs Care Coordination   Interventions:  Evaluation of current treatment plan related to  osteoporosis ,  self-management and patient's adherence to plan as established by provider. Collaboration with Midge Minium, MD regarding development and update of comprehensive plan of care as evidenced by provider attestation       and co-signature Inter-disciplinary care team collaboration (see longitudinal plan of care) Discussed plans with patient for ongoing care management follow up and provided patient with direct contact information for care management team Reviewed home safety and fall precautions Reviewed to take Fosamax weekly as directed Reviewed importance of regular exercise and resistnace trainingto help keep bones strong and to help with balance Reviewed to take Calcium supplement as directed to include foods that are higher in calcium and Vitamin D Self Care Activities:  Self administers medications as prescribed Attends all scheduled provider appointments Calls pharmacy for medication refills Calls provider office for new concerns or questions Patient Goals: - add more outdoor lighting - always use handrails on the stairs - always wear shoes or slippers  with non-slip sole - get at least 10 minutes of activity every day - keep a flashlight by the bed - keep cell phone with me always - make an emergency alert plan in case I fall - remove, or use a non-slip pad, with my throw rugs - use a nightlight in the bathroom Follow Up Plan: Telephone follow up appointment with care management team member scheduled for: 04/07/21 at 3:30 PM The patient has been provided with contact information for the care management team and has been advised to call with any health related questions or concerns.      Care Plan : RN Care Manager Plan of Care  Updates made by Dimitri Ped, RN since 04/07/2021 12:00 AM     Problem: Chronic Disease Management and Care Coordination Needs (Osteoporosis, HLD)   Priority: High     Long-Range Goal: Establish Plan of Care for Chronic Disease Management Needs (Osteoporosis, HLD)   Start Date: 04/07/2021  Expected End Date: 04/04/2022  Priority: High  Note:   Current Barriers:  Chronic Disease Management support and education needs related to HLD and Osteoporosis  States she saw the orthopedic doctor for her shoulder pain and he gave her exercises to do to help with her  shoulder.  States she has not started doing the exercises but plans to start after the first of the year.  States she has been walking more as she has been puppy sitting for her daughter and is walking at least a total of one mile a day.  States she is watching what she eats.  Denies any  falls.  States Dr. Birdie Riddle checked her urine and she did not have a UTI but she has frequency after drinking fluids.  RNCM Clinical Goal(s):  Patient will verbalize understanding of plan for management of HLD and Osteoporosis as evidenced by voiced adherence to plan of care verbalize basic understanding of  HLD and Osteoporosis disease process and self health management plan as evidenced by voiced understanding and teach back take all medications exactly as prescribed  and will call provider for medication related questions as evidenced by dispense report and pt verbalization attend all scheduled medical appointments: Oncology 07/15/21, Annual Wellness Visit 08/02/21 as evidenced by medical records demonstrate Ongoing adherence to prescribed treatment plan for HLD and Osteoporosis as evidenced by readings within limits, voiced adherence to plan of care  through collaboration with RN Care manager, provider, and care team.   Interventions: 1:1 collaboration with primary care provider regarding development and update of comprehensive plan of care as evidenced by provider attestation and co-signature Inter-disciplinary care team collaboration (see longitudinal plan of care) Evaluation of current treatment plan related to  self management and patient's adherence to plan as established by provider   Osteoporosis  (Status:  Goal on track:  Yes.)  Long Term Goal Evaluation of current treatment plan related to Osteoporosis,  self-management and patient's adherence to plan as established by provider. Discussed plans with patient for ongoing care management follow up and provided patient with direct contact information for care management team Evaluation of current treatment plan related to Osteoporosis and patient's adherence to plan as established by provider Advised patient to continue to walk and to start shoulder exercises Provided education to patient re: pelvic exercises Discussed plans with patient for ongoing care management follow up and provided patient with direct contact information for care management team  Hyperlipidemia Interventions:  (Status:  Goal on track:  Yes.) Long Term Goal Medication review performed; medication list updated in electronic medical record.  Provider established cholesterol goals reviewed Counseled on importance of regular laboratory monitoring as prescribed Reviewed importance of limiting foods high in cholesterol Reviewed exercise  goals and target of 150 minutes per week  Patient Goals/Self-Care Activities: Take all medications as prescribed Attend all scheduled provider appointments Call pharmacy for medication refills 3-7 days in advance of running out of medications Attend church or other social activities Perform all self care activities independently  Perform IADL's (shopping, preparing meals, housekeeping, managing finances) independently Call provider office for new concerns or questions  call for medicine refill 2 or 3 days before it runs out take all medications exactly as prescribed develop an exercise routine  Follow Up Plan:  Telephone follow up appointment with care management team member scheduled for:  06/23/21 The patient has been provided with contact information for the care management team and has been advised to call with any health related questions or concerns.       Plan:Telephone follow up appointment with care management team member scheduled for:  06/23/21 The patient has been provided with contact information for the care management team and has been advised to call with any health related questions or concerns.  Peter Garter RN,  [facility], CDE Care Management Coordinator [facility] [phone], Mobile [phone]

## 2021-04-19 DIAGNOSIS — U071 COVID-19: Secondary | ICD-10-CM | POA: Diagnosis not present

## 2021-04-24 DIAGNOSIS — E785 Hyperlipidemia, unspecified: Secondary | ICD-10-CM | POA: Diagnosis not present

## 2021-04-24 DIAGNOSIS — M818 Other osteoporosis without current pathological fracture: Secondary | ICD-10-CM | POA: Diagnosis not present

## 2021-05-05 DIAGNOSIS — H5213 Myopia, bilateral: Secondary | ICD-10-CM | POA: Diagnosis not present

## 2021-05-05 DIAGNOSIS — H40023 Open angle with borderline findings, high risk, bilateral: Secondary | ICD-10-CM | POA: Diagnosis not present

## 2021-05-05 DIAGNOSIS — H40053 Ocular hypertension, bilateral: Secondary | ICD-10-CM | POA: Diagnosis not present

## 2021-05-05 DIAGNOSIS — H524 Presbyopia: Secondary | ICD-10-CM | POA: Diagnosis not present

## 2021-05-05 DIAGNOSIS — H52223 Regular astigmatism, bilateral: Secondary | ICD-10-CM | POA: Diagnosis not present

## 2021-05-07 ENCOUNTER — Other Ambulatory Visit: Payer: Self-pay | Admitting: Family Medicine

## 2021-05-07 DIAGNOSIS — Z1231 Encounter for screening mammogram for malignant neoplasm of breast: Secondary | ICD-10-CM

## 2021-05-31 DIAGNOSIS — G8929 Other chronic pain: Secondary | ICD-10-CM | POA: Diagnosis not present

## 2021-05-31 DIAGNOSIS — K219 Gastro-esophageal reflux disease without esophagitis: Secondary | ICD-10-CM | POA: Diagnosis not present

## 2021-05-31 DIAGNOSIS — M810 Age-related osteoporosis without current pathological fracture: Secondary | ICD-10-CM | POA: Diagnosis not present

## 2021-05-31 DIAGNOSIS — E039 Hypothyroidism, unspecified: Secondary | ICD-10-CM | POA: Diagnosis not present

## 2021-05-31 DIAGNOSIS — G4733 Obstructive sleep apnea (adult) (pediatric): Secondary | ICD-10-CM | POA: Diagnosis not present

## 2021-05-31 DIAGNOSIS — M1990 Unspecified osteoarthritis, unspecified site: Secondary | ICD-10-CM | POA: Diagnosis not present

## 2021-05-31 DIAGNOSIS — K5900 Constipation, unspecified: Secondary | ICD-10-CM | POA: Diagnosis not present

## 2021-05-31 DIAGNOSIS — I951 Orthostatic hypotension: Secondary | ICD-10-CM | POA: Diagnosis not present

## 2021-05-31 DIAGNOSIS — J309 Allergic rhinitis, unspecified: Secondary | ICD-10-CM | POA: Diagnosis not present

## 2021-06-08 ENCOUNTER — Other Ambulatory Visit: Payer: Self-pay | Admitting: Family Medicine

## 2021-06-08 DIAGNOSIS — M8000XG Age-related osteoporosis with current pathological fracture, unspecified site, subsequent encounter for fracture with delayed healing: Secondary | ICD-10-CM

## 2021-06-16 ENCOUNTER — Other Ambulatory Visit: Payer: Self-pay

## 2021-06-16 ENCOUNTER — Ambulatory Visit
Admission: RE | Admit: 2021-06-16 | Discharge: 2021-06-16 | Disposition: A | Discharge status: Home / Self Care | Payer: Medicare PPO | Source: Ambulatory Visit | Attending: Family Medicine | Admitting: Family Medicine

## 2021-06-16 DIAGNOSIS — Z1231 Encounter for screening mammogram for malignant neoplasm of breast: Secondary | ICD-10-CM

## 2021-06-23 ENCOUNTER — Ambulatory Visit (INDEPENDENT_AMBULATORY_CARE_PROVIDER_SITE_OTHER): Payer: Medicare PPO

## 2021-06-23 DIAGNOSIS — M818 Other osteoporosis without current pathological fracture: Secondary | ICD-10-CM

## 2021-06-23 DIAGNOSIS — E785 Hyperlipidemia, unspecified: Secondary | ICD-10-CM

## 2021-06-23 NOTE — Chronic Care Management (AMB) (Signed)
?Chronic Care Management  ? ?CCM RN Visit Note ? ?06/23/2021 ?Name: Monica Diaz MRN: [id number] DOB: [birth year] ? ?Subjective: ?Monica Diaz is a 81 y.o. year old female who is a primary care patient of Birdie Riddle, Aundra Millet, MD. The care management team was consulted for assistance with disease management and care coordination needs.   ? ?Engaged with patient by telephone for follow up visit in response to provider referral for case management and/or care coordination services.  ? ?Consent to Services:  ?The patient was given information about Chronic Care Management services, agreed to services, and gave verbal consent prior to initiation of services.  Please see initial visit note for detailed documentation.  ? ?Patient agreed to services and verbal consent obtained.  ? ?Assessment: Review of patient past medical history, allergies, medications, health status, including review of consultants reports, laboratory and other test data, was performed as part of comprehensive evaluation and provision of chronic care management services.  ? ?SDOH (Social Determinants of Health) assessments and interventions performed:   ? ?CCM Care Plan ? ?Allergies  ?Allergen Reactions  ? Codeine   ?  REACTION: sick to stomach  ? ? ?Outpatient Encounter Medications as of 06/23/2021  ?Medication Sig  ? alendronate (FOSAMAX) 70 MG tablet TAKE 1 TABLET BY MOUTH 1 TIME EVERY WEEK WITH A FULL GLASS OF WATER AND ON AN EMPTY STOMACH  ? ARMOUR THYROID 60 MG tablet TAKE 1 TABLET BY MOUTH DAILY BEFORE BREAKFAST  ? azelastine (ASTELIN) 0.1 % nasal spray Place into both nostrils 2 (two) times daily. Use in each nostril as directed  ? calcium citrate-vitamin D (CITRACAL+D) 315-200 MG-UNIT per tablet Take 1 tablet by mouth 2 (two) times daily.  ? Coenzyme Q10 (COQ10) 200 MG CAPS Take 1 capsule by mouth at bedtime.  ? loratadine (CLARITIN) 10 MG tablet Take 10 mg by mouth daily as needed.   ? mometasone (NASONEX) 50 MCG/ACT nasal spray Place  2 sprays into the nose daily as needed.  ? Multiple Vitamins-Minerals (CENTRUM SILVER PO) Take 1 tablet by mouth.  ? Multiple Vitamins-Minerals (ZINC PO) Take by mouth.  ? Propylene Glycol (SYSTANE COMPLETE OP) Apply to eye.  ? pyridoxine (B-6) 100 MG tablet Take 100 mg by mouth 2 (two) times daily.  ? Red Yeast Rice 500 MG/0.5GM POWD Take by mouth.  ? vitamin B-12 (CYANOCOBALAMIN) 50 MCG tablet Take 50 mcg by mouth.  ? vitamin C (ASCORBIC ACID) 500 MG tablet Take 500 mg by mouth 2 (two) times daily.  ? ?No facility-administered encounter medications on file as of 06/23/2021.  ? ? ?Patient Active Problem List  ? Diagnosis Date Noted  ? Anemia 06/24/2019  ? 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  ? Neuropathy of both feet 03/13/2017  ? Bilateral hand numbness 02/25/2015  ? Chronic hand pain 12/08/2014  ? Chronic cough 12/08/2014  ? Malignant neoplasm of upper-outer quadrant of right breast in female, estrogen receptor positive ([place]) 05/16/2013  ? OSA (obstructive sleep apnea) 05/15/2012  ? 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 Other neutropenia ([place]) 05/13/2011  ? General medical examination 05/13/2011  ? BUNION 03/25/2010  ? Hypothyroidism 12/02/2009  ? BENIGN NEOPLASM OF SKIN SITE UNSPECIFIED 02/10/2009  ? Vitamin D deficiency 02/10/2009  ? Hyperlipidemia 12/22/2008  ? RHINITIS 12/22/2008  ? NECK PAIN 12/22/2008  ? Osteoporosis 12/22/2008  ? VERTIGO 12/22/2008  ? ? ?Conditions to be addressed/monitored:HLD and Osteoporosis ? ?Care Plan : RN Care Manager Plan of Care  ?Updates made by Dimitri Ped,  RN since 06/23/2021 12:00 AM  ?  ? ?Problem: Chronic Disease Management and Care Coordination Needs (Osteoporosis, HLD)   ?Priority: High  ?  ? ?Long-Range Goal: Establish Plan of Care for Chronic Disease Management Needs (Osteoporosis, HLD)   ?Start Date: 04/07/2021  ?Expected End Date: 04/04/2022  ?Priority: High  ?Note:   ?Current Barriers:  ?Chronic Disease Management support and education needs related to HLD and  Osteoporosis  ?States she has started going back to the gym 5 days a week.  States she walks for 2 miles on the treadmill and lower body 3 days a week and works on upper body 2 days a week States she is watching what she eats and is trying to eat more fiber.  Denies any  falls.  States her should has not been bothering her recently ? ?RNCM Clinical Goal(s):  ?Patient will verbalize understanding of plan for management of HLD and Osteoporosis as evidenced by voiced adherence to plan of care ?verbalize basic understanding of  HLD and Osteoporosis disease process and self health management plan as evidenced by voiced understanding and teach back ?take all medications exactly as prescribed and will call provider for medication related questions as evidenced by dispense report and pt verbalization ?attend all scheduled medical appointments: Oncology 07/15/21, Annual Wellness Visit 08/02/21 as evidenced by medical records ?demonstrate Ongoing adherence to prescribed treatment plan for HLD and Osteoporosis as evidenced by readings within limits, voiced adherence to plan of care  through collaboration with RN Care manager, provider, and care team.  ? ?Interventions: ?1:1 collaboration with primary care provider regarding development and update of comprehensive plan of care as evidenced by provider attestation and co-signature ?Inter-disciplinary care team collaboration (see longitudinal plan of care) ?Evaluation of current treatment plan related to  self management and patient's adherence to plan as established by provider ? ? ?Osteoporosis  (Status:  Goal on track:  Yes.)  Long Term Goal ?Evaluation of current treatment plan related to Osteoporosis,  self-management and patient's adherence to plan as established by provider. ?Discussed plans with patient for ongoing care management follow up and provided patient with direct contact information for care management team ?Evaluation of current treatment plan related to  Osteoporosis and patient's adherence to plan as established by provider ?Advised patient to continue to  exercise at the gym ?Provided education to patient re: pelvic exercises ?Discussed plans with patient for ongoing care management follow up and provided patient with direct contact information for care management team ?Reviewed to try to eat food with Vitamin D and calcium. Reviewed to do resistance training to help strengthen bones ? ?Hyperlipidemia Interventions:  (Status:  Goal on track:  Yes.) Long Term Goal ?Medication review performed; medication list updated in electronic medical record.  ?Provider established cholesterol goals reviewed ?Counseled on importance of regular laboratory monitoring as prescribed ?Reviewed importance of limiting foods high in cholesterol ?Reviewed exercise goals and target of 150 minutes per week ?Reviewed to eat more foods with more fiber to help with lipids. Reviewed how even 5-10 lb weight loss can help with lipids ? ?Patient Goals/Self-Care Activities: ?Take all medications as prescribed ?Attend all scheduled provider appointments ?Call pharmacy for medication refills 3-7 days in advance of running out of medications ?Attend church or other social activities ?Perform all self care activities independently  ?Perform IADL's (shopping, preparing meals, housekeeping, managing finances) independently ?Call provider office for new concerns or questions  ?take all medications exactly as prescribed ?develop an exercise routine ? ?Follow Up Plan:  Telephone follow up appointment with care management team member scheduled for:  10/06/21 ?The patient has been provided with contact information for the care management team and has been advised to call with any health related questions or concerns.   ?  ? ? ?Plan:Telephone follow up appointment with care management team member scheduled for:  10/06/21 ?The patient has been provided with contact information for the care management team and  has been advised to call with any health related questions or concerns.  ?Peter Garter RN, BSN,CCM, CDE ?Care Management Coordinator ?[facility] ?[phone]  ? ? ? ? ? ? ? ? ?

## 2021-06-23 NOTE — Patient Instructions (Addendum)
Visit Information ? ?Thank you for taking time to visit with me today. Please don't hesitate to contact me if I can be of assistance to you before our next scheduled telephone appointment. ? ?Following are the goals we discussed today:  ?Take all medications as prescribed ?Attend all scheduled provider appointments ?Call pharmacy for medication refills 3-7 days in advance of running out of medications ?Attend church or other social activities ?Perform all self care activities independently  ?Perform IADL's (shopping, preparing meals, housekeeping, managing finances) independently ?Call provider office for new concerns or questions  ?take all medications exactly as prescribed ?develop an exercise routine ?Eating Plan for Osteoporosis ?Osteoporosis causes your bones to become weak and brittle. This puts you at greater risk for bone breaks (fractures) from small bumps or falls. Making changes to your diet and increasing your physical activity can help strengthen your bones and improve your overall health. ?Calcium and vitamin D are nutrients that play an important role in bone health. Vitamin D helps your body use calcium and strengthen bones. It is important to get enough calcium and vitamin D as part of your eating plan for osteoporosis. ?What are tips for following this plan? ?Reading food labels ?Try to get at least 1,000 milligrams (mg) of calcium each day. ?Look for foods that have at least 50 mg of calcium per serving. ?Talk with your health care provider about taking a calcium supplement if you do not get enough calcium from food. ?Do not have more than 2,500 mg of calcium each day. This is the upper limit for food and nutritional supplements combined. Too much calcium may cause constipation and prevent you from absorbing other important nutrients. ?Choose foods that contain vitamin D. ?Take a daily vitamin supplement that contains 800-1,000 international units (IU) of vitamin D. The amount may be different  depending on your age, body weight, and where you live. Talk with your dietitian or health care provider about how much vitamin D is right for you. ?Avoid foods that have more than 300 mg of sodium per serving. Too much sodium can cause your body to lose calcium. ?Talk with your dietitian or health care provider about how much sodium you are allowed each day. ?Shopping ?Do not buy foods with added salt, including: ?Salted snacks. ?Angie Fava. ?Canned soups. ?Canned meats. ?Processed meats, such as bacon or precooked or cured meat like sausages or meat loaves. ?Smoked fish. ?Meal planning ?Eat balanced meals that contain protein foods, fruits and vegetables, and foods rich in calcium and vitamin D. ?Eat at least 5 servings of fruits and vegetables each day. ?Eat 5-6 oz (142-170 g) of lean meat, poultry, fish, eggs, or beans each day. ?Lifestyle ?Do not use any products that contain nicotine or tobacco, such as cigarettes, e-cigarettes, and chewing tobacco. If you need help quitting, ask your health care provider. ?If your health care provider recommends that you lose weight: ?Work with a dietitian to develop an eating plan that will help you reach your desired weight goal. ?Exercise for at least 30 minutes a day, 5 or more days a week, or as told by your health care provider. ?Work with a physical therapist to develop an exercise plan that includes flexibility, balance, and strength exercises. Do not focus only on aerobic exercise. ?Do not drink alcohol if: ?Your health care provider tells you not to drink. ?You are pregnant, may be pregnant, or are planning to become pregnant. ?If you drink alcohol: ?Limit how much you use to: ?0-1 drink a  day for women. ?0-2 drinks a day for men. ?Be aware of how much alcohol is in your drink. In the [place] one drink equals one 12 oz bottle of beer (355 mL), one 5 oz glass of wine (148 mL), or one 1? oz glass of hard liquor (44 mL). ?What foods should I eat? ?Foods high in  calcium ? ?Yogurt. Yogurt with fruit. ?Milk. Evaporated skim milk. Dry milk powder. ?Calcium-fortified orange juice. ?Parmesan cheese. Part-skim ricotta cheese. Natural hard cheese. Cream cheese. Cottage cheese. ?Canned sardines. Canned salmon. ?Calcium-treated tofu. Calcium-fortified cereal bar. Calcium-fortified cereal. Calcium-fortified graham crackers. ?Cooked collard greens. Turnip greens. Broccoli. Kale. ?Almonds. ?White beans. ?Corn tortilla. ?Foods high in vitamin D ?Cod liver oil. Fatty fish, such as tuna, mackerel, and salmon. ?Milk. Fortified soy milk. Fortified fruit juice. ?Yogurt. Margarine. ?Egg yolks. ?Foods high in protein ?Beef. Lamb. Pork tenderloin. ?Chicken breast. ?Tuna (canned). Fish fillet. ?Tofu. ?Cooked soy beans. Soy patty. Beans (canned or cooked). ?Cottage cheese. ?Yogurt. ?Peanut butter. ?Pumpkin seeds. Nuts. Sunflower seeds. ?Hard cheese. ?Milk or other milk products, such as soy milk. ?The items listed above may not be a complete list of foods and beverages you can eat. Contact a dietitian for more options. ?Summary ?Calcium and vitamin D are nutrients that play an important role in bone health and are an important part of your eating plan for osteoporosis. ?Eat balanced meals that contain protein foods, fruits and vegetables, and foods rich in calcium and vitamin D. ?Avoid foods that have more than 300 mg of sodium per serving. Too much sodium can cause your body to lose calcium. ?Exercise is an important part of prevention and treatment of osteoporosis. Aim for at least 30 minutes a day, 5 days a week. ?This information is not intended to replace advice given to you by your health care provider. Make sure you discuss any questions you have with your health care provider. ?Document Revised: 09/26/2019 Document Reviewed: 09/26/2019 ?Elsevier Patient Education ? [place]. ? ?Our next appointment is by telephone on 10/06/21 at 3 PM ? ?Please call the care guide team at  [phone] if you need to cancel or reschedule your appointment.  ? ?If you are experiencing a Mental Health or [place] or need someone to talk to, please call the Suicide and Crisis Lifeline: 988 ?call the [place] National Suicide Prevention Lifeline: 760 250 6706 or TTY: 2078225059 TTY 9855168213) to talk to a trained counselor ?call 1-800-273-TALK (toll free, 24 hour hotline) ?go to [facility] [address] [place] [id number]) ?call 911  ? ?Patient verbalizes understanding of instructions and care plan provided today and agrees to view in [place]. Active MyChart status confirmed with patient.   ? ?Peter Garter RN, BSN,CCM, CDE ?Care Management Coordinator ?[facility] ?[phone]   ?

## 2021-07-05 DIAGNOSIS — H903 Sensorineural hearing loss, bilateral: Secondary | ICD-10-CM | POA: Diagnosis not present

## 2021-07-07 ENCOUNTER — Other Ambulatory Visit: Payer: Self-pay | Admitting: Otolaryngology

## 2021-07-07 DIAGNOSIS — H903 Sensorineural hearing loss, bilateral: Secondary | ICD-10-CM

## 2021-07-13 ENCOUNTER — Other Ambulatory Visit: Payer: Self-pay

## 2021-07-13 DIAGNOSIS — C50411 Malignant neoplasm of upper-outer quadrant of right female breast: Secondary | ICD-10-CM

## 2021-07-14 NOTE — Progress Notes (Signed)
? ?Patient Care Team: ?Midge Minium, MD as PCP - General ?Dohmeier, Asencion Partridge, MD as Consulting Physician (Neurology) ?Juanita Craver, MD as Consulting Physician (Gastroenterology) ?Magrinat, Virgie Dad, MD (Inactive) as Consulting Physician (Oncology) ?Roseanne Kaufman, MD as Consulting Physician (Orthopedic Surgery) ?Dimitri Ped, RN as Case Manager ? ?DIAGNOSIS:  ?Encounter Diagnosis  ?Name Primary?  ? Malignant neoplasm of upper-outer quadrant of right breast in female, estrogen receptor positive ([place])   ? ? ?CHIEF COMPLIANT: Estrogen receptor positive breast cancer, to establish oncology care ? ?INTERVAL HISTORY: Monica Diaz is a 81 yo with the above mention Estrogen receptor positive breast cancer. She presents to the clinic today for a follow-up.  She is complaining of pulling and tugging sensation in the breast as well as a tender spot in the right axilla.  Denies any other problems or concerns. ? ? ?ALLERGIES:  is allergic to codeine. ? ?MEDICATIONS:  ?Current Outpatient Medications  ?Medication Sig Dispense Refill  ? alendronate (FOSAMAX) 70 MG tablet TAKE 1 TABLET BY MOUTH 1 TIME EVERY WEEK WITH A FULL GLASS OF WATER AND ON AN EMPTY STOMACH 12 tablet 0  ? ARMOUR THYROID 60 MG tablet TAKE 1 TABLET BY MOUTH DAILY BEFORE BREAKFAST 30 tablet 6  ? azelastine (ASTELIN) 0.1 % nasal spray Place into both nostrils 2 (two) times daily. Use in each nostril as directed    ? calcium citrate-vitamin D (CITRACAL+D) 315-200 MG-UNIT per tablet Take 1 tablet by mouth 2 (two) times daily.    ? Coenzyme Q10 (COQ10) 200 MG CAPS Take 1 capsule by mouth at bedtime.    ? loratadine (CLARITIN) 10 MG tablet Take 10 mg by mouth daily as needed.     ? mometasone (NASONEX) 50 MCG/ACT nasal spray Place 2 sprays into the nose daily as needed. 17 g 6  ? Multiple Vitamins-Minerals (CENTRUM SILVER PO) Take 1 tablet by mouth.    ? Multiple Vitamins-Minerals (ZINC PO) Take by mouth.    ? Propylene Glycol (SYSTANE COMPLETE OP)  Apply to eye.    ? pyridoxine (B-6) 100 MG tablet Take 100 mg by mouth 2 (two) times daily.    ? Red Yeast Rice 500 MG/0.5GM POWD Take by mouth.    ? vitamin B-12 (CYANOCOBALAMIN) 50 MCG tablet Take 50 mcg by mouth.    ? vitamin C (ASCORBIC ACID) 500 MG tablet Take 500 mg by mouth 2 (two) times daily.    ? ?No current facility-administered medications for this visit.  ? ? ?PHYSICAL EXAMINATION: ?ECOG PERFORMANCE STATUS: 1 - Symptomatic but completely ambulatory ? ?Vitals:  ? 07/15/21 1322  ?BP: 129/80  ?Pulse: 76  ?Resp: 18  ?Temp: (!) 97.4 ?F (36.3 ?C)  ?SpO2: 98%  ? ?Filed Weights  ? 07/15/21 1322  ?Weight: 162 lb (73.5 kg)  ? ? ?BREAST: No palpable masses or nodules in either right or left breasts. No palpable axillary supraclavicular or infraclavicular adenopathy no breast tenderness or nipple discharge. (exam performed in the presence of a chaperone) ? ?LABORATORY DATA:  ?I have reviewed the data as listed ? ?  Latest Ref Rng & Units 07/15/2021  ? 12:48 PM 01/26/2021  ? 11:57 AM 06/16/2020  ?  1:07 PM  ?CMP  ?Glucose 70 - 99 mg/dL 112   80   64    ?BUN 8 - 23 mg/dL _0 ?Creatinine 0.44 - 1.00 mg/dL 1.05   0.87   1.03    ?Sodium 135 -  145 mmol/L 138   139   137    ?Potassium 3.5 - 5.1 mmol/L 4.1   4.0   4.0    ?Chloride 98 - 111 mmol/L 106   105   106    ?CO2 22 - 32 mmol/L _0 ?Calcium 8.9 - 10.3 mg/dL 9.4   9.3   8.9    ?Total Protein 6.5 - 8.1 g/dL 7.4   7.1   7.5    ?Total Bilirubin 0.3 - 1.2 mg/dL 0.5   0.3   0.3    ?Alkaline Phos 38 - 126 U/L 40   46   41    ?AST 15 - 41 U/L _1 ?ALT 0 - 44 U/L _2 ? ? ?Lab Results  ?Component Value Date  ? WBC 5.1 07/15/2021  ? HGB 11.4 (L) 07/15/2021  ? HCT 35.1 (L) 07/15/2021  ? MCV 91.4 07/15/2021  ? PLT 244 07/15/2021  ? NEUTROABS 2.4 07/15/2021  ? ? ?ASSESSMENT & PLAN:  ?Malignant neoplasm of upper-outer quadrant of right breast in female, estrogen receptor positive ([place]) ?05/14/2013: T2N0 stage IIa grade 2-3 IDC ER 99%, PR  50% HER2 negative, Ki-67 91% ?05/20/2013: Right sternal lesion 6 mm, negative on PET scan ?07/12/2013: Right lumpectomy: Stage IIa grade 2 IDC with mucinous features ER/PR positive HER2 negative left axillary lymph nodes: Benign ?07/12/2013: Oncotype DX score 18 (risk of distant recurrence 12%) ?08/14/2013: Adjuvant radiation completed ?09/06/2013: Anastrozole started discontinued 09/12/2018 ? ?Breast cancer surveillance: ?1.  Breast exam 07/15/2021: Benign ?2. mammogram 06/16/2021: Benign breast density category B ? ?Return to clinic in 1 year for follow-up ? ? ? ?No orders of the defined types were placed in this encounter. ? ?The patient has a good understanding of the overall plan. she agrees with it. she will call with any problems that may develop before the next visit here. ?Total time spent: 30 mins including face to face time and time spent for planning, charting and co-ordination of care ? ? Harriette Ohara, MD ?07/15/21 ? ? ? I Gardiner Coins am scribing for Dr. Lindi Adie ? ?I have reviewed the above documentation for accuracy and completeness, and I agree with the above. ?  ?

## 2021-07-15 ENCOUNTER — Other Ambulatory Visit: Payer: Self-pay

## 2021-07-15 ENCOUNTER — Inpatient Hospital Stay: Payer: Medicare PPO

## 2021-07-15 ENCOUNTER — Inpatient Hospital Stay: Payer: Medicare PPO | Attending: Hematology and Oncology | Admitting: Hematology and Oncology

## 2021-07-15 DIAGNOSIS — C50411 Malignant neoplasm of upper-outer quadrant of right female breast: Secondary | ICD-10-CM

## 2021-07-15 DIAGNOSIS — Z79899 Other long term (current) drug therapy: Secondary | ICD-10-CM | POA: Diagnosis not present

## 2021-07-15 DIAGNOSIS — Z170 Estrogen receptor positive status [ER+]: Secondary | ICD-10-CM

## 2021-07-15 DIAGNOSIS — Z885 Allergy status to narcotic agent status: Secondary | ICD-10-CM | POA: Diagnosis not present

## 2021-07-15 LAB — CBC WITH DIFFERENTIAL (CANCER CENTER ONLY)
Abs Immature Granulocytes: 0.01 10*3/uL (ref 0.00–0.07)
Basophils Absolute: 0 10*3/uL (ref 0.0–0.1)
Basophils Relative: 0 %
Eosinophils Absolute: 0.1 10*3/uL (ref 0.0–0.5)
Eosinophils Relative: 1 %
HCT: 35.1 % — ABNORMAL LOW (ref 36.0–46.0)
Hemoglobin: 11.4 g/dL — ABNORMAL LOW (ref 12.0–15.0)
Immature Granulocytes: 0 %
Lymphocytes Relative: 43 %
Lymphs Abs: 2.2 10*3/uL (ref 0.7–4.0)
MCH: 29.7 pg (ref 26.0–34.0)
MCHC: 32.5 g/dL (ref 30.0–36.0)
MCV: 91.4 fL (ref 80.0–100.0)
Monocytes Absolute: 0.5 10*3/uL (ref 0.1–1.0)
Monocytes Relative: 9 %
Neutro Abs: 2.4 10*3/uL (ref 1.7–7.7)
Neutrophils Relative %: 47 %
Platelet Count: 244 10*3/uL (ref 150–400)
RBC: 3.84 MIL/uL — ABNORMAL LOW (ref 3.87–5.11)
RDW: 15.1 % (ref 11.5–15.5)
WBC Count: 5.1 10*3/uL (ref 4.0–10.5)
nRBC: 0 % (ref 0.0–0.2)

## 2021-07-15 LAB — CMP (CANCER CENTER ONLY)
ALT: 14 U/L (ref 0–44)
AST: 20 U/L (ref 15–41)
Albumin: 4.1 g/dL (ref 3.5–5.0)
Alkaline Phosphatase: 40 U/L (ref 38–126)
Anion gap: 4 — ABNORMAL LOW (ref 5–15)
BUN: 13 mg/dL (ref 8–23)
CO2: 28 mmol/L (ref 22–32)
Calcium: 9.4 mg/dL (ref 8.9–10.3)
Chloride: 106 mmol/L (ref 98–111)
Creatinine: 1.05 mg/dL — ABNORMAL HIGH (ref 0.44–1.00)
GFR, Estimated: 54 mL/min — ABNORMAL LOW (ref >60)
Glucose, Bld: 112 mg/dL — ABNORMAL HIGH (ref 70–99)
Potassium: 4.1 mmol/L (ref 3.5–5.1)
Sodium: 138 mmol/L (ref 135–145)
Total Bilirubin: 0.5 mg/dL (ref 0.3–1.2)
Total Protein: 7.4 g/dL (ref 6.5–8.1)

## 2021-07-15 MED ORDER — RED YEAST RICE EXTRACT 600 MG PO CAPS: 2.0000 | ORAL_CAPSULE | Freq: Two times a day (BID) | ORAL | 0 refills | Status: AC

## 2021-07-15 NOTE — Assessment & Plan Note (Signed)
05/14/2013: T2N0 stage IIa grade 2-3 IDC ER 99%, PR 50% HER2 negative, Ki-67 91% ?05/20/2013: Right sternal lesion 6 mm, negative on PET scan ?07/12/2013: Right lumpectomy: Stage IIa grade 2 IDC with mucinous features ER/PR positive HER2 negative left axillary lymph nodes: Benign ?07/12/2013: Oncotype DX score 18 (risk of distant recurrence 12%) ?08/14/2013: Adjuvant radiation completed ?09/06/2013: Anastrozole started discontinued 09/12/2018 ? ?Breast cancer surveillance: ?1.  Breast exam 07/15/2021: Benign ?2. mammogram 06/16/2021: Benign breast density category B ? ?Return to clinic in 1 year for follow-up ?

## 2021-07-18 ENCOUNTER — Encounter: Payer: Self-pay | Admitting: Hematology and Oncology

## 2021-07-23 DIAGNOSIS — E785 Hyperlipidemia, unspecified: Secondary | ICD-10-CM

## 2021-07-23 DIAGNOSIS — M818 Other osteoporosis without current pathological fracture: Secondary | ICD-10-CM

## 2021-07-26 ENCOUNTER — Ambulatory Visit
Admission: RE | Admit: 2021-07-26 | Discharge: 2021-07-26 | Disposition: A | Discharge status: Home / Self Care | Payer: Medicare PPO | Source: Ambulatory Visit | Attending: Otolaryngology | Admitting: Otolaryngology

## 2021-07-26 DIAGNOSIS — H903 Sensorineural hearing loss, bilateral: Secondary | ICD-10-CM

## 2021-07-26 DIAGNOSIS — H9041 Sensorineural hearing loss, unilateral, right ear, with unrestricted hearing on the contralateral side: Secondary | ICD-10-CM | POA: Diagnosis not present

## 2021-07-26 DIAGNOSIS — J3489 Other specified disorders of nose and nasal sinuses: Secondary | ICD-10-CM | POA: Diagnosis not present

## 2021-07-26 MED ORDER — GADOBENATE DIMEGLUMINE 529 MG/ML IV SOLN
15.0000 mL | Freq: Once | INTRAVENOUS | Status: AC | PRN
  Administered 2021-07-26: 15 mL via INTRAVENOUS

## 2021-08-02 ENCOUNTER — Ambulatory Visit: Payer: Medicare PPO

## 2021-08-12 ENCOUNTER — Ambulatory Visit (INDEPENDENT_AMBULATORY_CARE_PROVIDER_SITE_OTHER): Payer: Medicare PPO

## 2021-08-12 DIAGNOSIS — Z0000 Encounter for general adult medical examination without abnormal findings: Secondary | ICD-10-CM

## 2021-08-12 NOTE — Patient Instructions (Signed)
Monica Diaz , ?Thank you for taking time to come for your Medicare Wellness Visit. I appreciate your ongoing commitment to your health goals. Please review the following plan we discussed and let me know if I can assist you in the future.  ? ?Screening recommendations/referrals: ?Colonoscopy: no longer required  ?Mammogram: no longer required  ?Bone Density: 01/29/2021 ?Recommended yearly ophthalmology/optometry visit for glaucoma screening and checkup ?Recommended yearly dental visit for hygiene and checkup ? ?Vaccinations: ?Influenza vaccine: completed  ?Pneumococcal vaccine: completed  ?Tdap vaccine: 03/18/2014 ?Shingles vaccine: completed    ? ?Advanced directives: none  ? ?Conditions/risks identified: none  ? ?Next appointment: none  ? ? ?Preventive Care 37 Years and Older, Female ?Preventive care refers to lifestyle choices and visits with your health care provider that can promote health and wellness. ?What does preventive care include? ?A yearly physical exam. This is also called an annual well check. ?Dental exams once or twice a year. ?Routine eye exams. Ask your health care provider how often you should have your eyes checked. ?Personal lifestyle choices, including: ?Daily care of your teeth and gums. ?Regular physical activity. ?Eating a healthy diet. ?Avoiding tobacco and drug use. ?Limiting alcohol use. ?Practicing safe sex. ?Taking low-dose aspirin every day. ?Taking vitamin and mineral supplements as recommended by your health care provider. ?What happens during an annual well check? ?The services and screenings done by your health care provider during your annual well check will depend on your age, overall health, lifestyle risk factors, and family history of disease. ?Counseling  ?Your health care provider may ask you questions about your: ?Alcohol use. ?Tobacco use. ?Drug use. ?Emotional well-being. ?Home and relationship well-being. ?Sexual activity. ?Eating habits. ?History of falls. ?Memory and  ability to understand (cognition). ?Work and work Statistician. ?Reproductive health. ?Screening  ?You may have the following tests or measurements: ?Height, weight, and BMI. ?Blood pressure. ?Lipid and cholesterol levels. These may be checked every 5 years, or more frequently if you are over 41 years old. ?Skin check. ?Lung cancer screening. You may have this screening every year starting at age 81 if you have a 30-pack-year history of smoking and currently smoke or have quit within the past 15 years. ?Fecal occult blood test (FOBT) of the stool. You may have this test every year starting at age 35. ?Flexible sigmoidoscopy or colonoscopy. You may have a sigmoidoscopy every 5 years or a colonoscopy every 10 years starting at age 15. ?Hepatitis C blood test. ?Hepatitis B blood test. ?Sexually transmitted disease (STD) testing. ?Diabetes screening. This is done by checking your blood sugar (glucose) after you have not eaten for a while (fasting). You may have this done every 1-3 years. ?Bone density scan. This is done to screen for osteoporosis. You may have this done starting at age 20. ?Mammogram. This may be done every 1-2 years. Talk to your health care provider about how often you should have regular mammograms. ?Talk with your health care provider about your test results, treatment options, and if necessary, the need for more tests. ?Vaccines  ?Your health care provider may recommend certain vaccines, such as: ?Influenza vaccine. This is recommended every year. ?Tetanus, diphtheria, and acellular pertussis (Tdap, Td) vaccine. You may need a Td booster every 10 years. ?Zoster vaccine. You may need this after age 31. ?Pneumococcal 13-valent conjugate (PCV13) vaccine. One dose is recommended after age 54. ?Pneumococcal polysaccharide (PPSV23) vaccine. One dose is recommended after age 36. ?Talk to your health care provider about which screenings and vaccines  you need and how often you need them. ?This information is  not intended to replace advice given to you by your health care provider. Make sure you discuss any questions you have with your health care provider. ?Document Released: 05/08/2015 Document Revised: 12/30/2015 Document Reviewed: 02/10/2015 ?Elsevier Interactive Patient Education ? 2017 [place]. ? ?Fall Prevention in the Home ?Falls can cause injuries. They can happen to people of all ages. There are many things you can do to make your home safe and to help prevent falls. ?What can I do on the outside of my home? ?Regularly fix the edges of walkways and driveways and fix any cracks. ?Remove anything that might make you trip as you walk through a door, such as a raised step or threshold. ?Trim any bushes or trees on the path to your home. ?Use bright outdoor lighting. ?Clear any walking paths of anything that might make someone trip, such as rocks or tools. ?Regularly check to see if handrails are loose or broken. Make sure that both sides of any steps have handrails. ?Any raised decks and porches should have guardrails on the edges. ?Have any leaves, snow, or ice cleared regularly. ?Use sand or salt on walking paths during winter. ?Clean up any spills in your garage right away. This includes oil or grease spills. ?What can I do in the bathroom? ?Use night lights. ?Install grab bars by the toilet and in the tub and shower. Do not use towel bars as grab bars. ?Use non-skid mats or decals in the tub or shower. ?If you need to sit down in the shower, use a plastic, non-slip stool. ?Keep the floor dry. Clean up any water that spills on the floor as soon as it happens. ?Remove soap buildup in the tub or shower regularly. ?Attach bath mats securely with double-sided non-slip rug tape. ?Do not have throw rugs and other things on the floor that can make you trip. ?What can I do in the bedroom? ?Use night lights. ?Make sure that you have a light by your bed that is easy to reach. ?Do not use any sheets or blankets that  are too big for your bed. They should not hang down onto the floor. ?Have a firm chair that has side arms. You can use this for support while you get dressed. ?Do not have throw rugs and other things on the floor that can make you trip. ?What can I do in the kitchen? ?Clean up any spills right away. ?Avoid walking on wet floors. ?Keep items that you use a lot in easy-to-reach places. ?If you need to reach something above you, use a strong step stool that has a grab bar. ?Keep electrical cords out of the way. ?Do not use floor polish or wax that makes floors slippery. If you must use wax, use non-skid floor wax. ?Do not have throw rugs and other things on the floor that can make you trip. ?What can I do with my stairs? ?Do not leave any items on the stairs. ?Make sure that there are handrails on both sides of the stairs and use them. Fix handrails that are broken or loose. Make sure that handrails are as long as the stairways. ?Check any carpeting to make sure that it is firmly attached to the stairs. Fix any carpet that is loose or worn. ?Avoid having throw rugs at the top or bottom of the stairs. If you do have throw rugs, attach them to the floor with carpet tape. ?Make sure  that you have a light switch at the top of the stairs and the bottom of the stairs. If you do not have them, ask someone to add them for you. ?What else can I do to help prevent falls? ?Wear shoes that: ?Do not have high heels. ?Have rubber bottoms. ?Are comfortable and fit you well. ?Are closed at the toe. Do not wear sandals. ?If you use a stepladder: ?Make sure that it is fully opened. Do not climb a closed stepladder. ?Make sure that both sides of the stepladder are locked into place. ?Ask someone to hold it for you, if possible. ?Clearly mark and make sure that you can see: ?Any grab bars or handrails. ?First and last steps. ?Where the edge of each step is. ?Use tools that help you move around (mobility aids) if they are needed. These  include: ?Canes. ?Walkers. ?Scooters. ?Crutches. ?Turn on the lights when you go into a dark area. Replace any light bulbs as soon as they burn out. ?Set up your furniture so you have a clear path. [place]i

## 2021-08-12 NOTE — Progress Notes (Signed)
? ?Subjective:  ? Monica Diaz is a 81 y.o. female who presents for Medicare Annual (Subsequent) preventive examination. ? ?I connected with Monica Diaz  today by telephone and verified that I am speaking with the correct person using two identifiers. ?Location patient: home ?Location provider: work ?Persons participating in the virtual visit: patient, provider. ?  ?I discussed the limitations, risks, security and privacy concerns of performing an evaluation and management service by telephone and the availability of in person appointments. I also discussed with the patient that there may be a patient responsible charge related to this service. The patient expressed understanding and verbally consented to this telephonic visit.  ?  ?Interactive audio and video telecommunications were attempted between this provider and patient, however failed, due to patient having technical difficulties OR patient did not have access to video capability.  We continued and completed visit with audio only. ? ?  ?Review of Systems    ? ?Cardiac Risk Factors include: advanced age (>62mn, >>20women) ? ?   ?Objective:  ?  ?Today's Vitals  ? ?There is no height or weight on file to calculate BMI. ? ? ?  08/12/2021  ?  1:25 PM 01/27/2021  ?  1:27 PM 07/27/2020  ?  1:43 PM 09/14/2016  ? 12:16 PM 12/17/2015  ?  3:55 PM 05/26/2015  ? 11:15 AM 05/21/2015  ? 11:23 AM  ?Advanced Directives  ?Does Patient Have a Medical Advance Directive? No Yes Yes Yes Yes Yes No  ?Type of ACorporate treasurerof [place]al[place]Living will [place] will Living will;Healthcare Power of Attorney    ?Copy of HTrianglein Chart?  No - copy requested Yes - validated most recent copy scanned in chart (See row information) No - copy requested Yes    ?Would patient like information on creating a medical advance directive? No - Patient declined        ? ? ?Current Medications  (verified) ?Outpatient Encounter Medications as of 08/12/2021  ?Medication Sig  ? alendronate (FOSAMAX) 70 MG tablet TAKE 1 TABLET BY MOUTH 1 TIME EVERY WEEK WITH A FULL GLASS OF WATER AND ON AN EMPTY STOMACH  ? ARMOUR THYROID 60 MG tablet TAKE 1 TABLET BY MOUTH DAILY BEFORE BREAKFAST  ? azelastine (ASTELIN) 0.1 % nasal spray Place into both nostrils 2 (two) times daily. Use in each nostril as directed  ? calcium citrate-vitamin D (CITRACAL+D) 315-200 MG-UNIT per tablet Take 1 tablet by mouth 2 (two) times daily.  ? Coenzyme Q10 (COQ10) 200 MG CAPS Take 1 capsule by mouth at bedtime.  ? loratadine (CLARITIN) 10 MG tablet Take 10 mg by mouth daily as needed.   ? Multiple Vitamins-Minerals (ZINC PO) Take by mouth.  ? Propylene Glycol (SYSTANE COMPLETE OP) Apply to eye.  ? pyridoxine (B-6) 100 MG tablet Take 100 mg by mouth 2 (two) times daily.  ? Red Yeast Rice Extract ([employer]S RED YEAST RICE) 600 MG CAPS Take 2 capsules (1,200 mg total) by mouth 2 (two) times daily.  ? vitamin B-12 (CYANOCOBALAMIN) 50 MCG tablet Take 50 mcg by mouth.  ? vitamin C (ASCORBIC ACID) 500 MG tablet Take 500 mg by mouth 2 (two) times daily.  ? ?No facility-administered encounter medications on file as of 08/12/2021.  ? ? ?Allergies (verified) ?Codeine  ? ?History: ?Past Medical History:  ?Diagnosis Date  ? Arthritis   ? Breast cancer ([place]  ? [facility]  ?  breast  ? HOH (hard of hearing)   ? Hx of radiation therapy 07/18/13-08/14/13  ? right breast total dose 50 Gy  ? Hyperlipidemia   ? Osteoporosis   ? Personal history of radiation therapy 2015  ? Seasonal allergies   ? Sleep apnea   ? uses a cpap  ? Thyroid disease   ? Wears glasses   ? ?Past Surgical History:  ?Procedure Laterality Date  ? ABDOMINAL HYSTERECTOMY    ? fibroids  ? BREAST BIOPSY Right 2015  ? core   ? BREAST BIOPSY Left 06/18/2018  ? DENSE FIBROSIS WITH CALCIFICATIONS AND CHRONIC  ? BREAST BIOPSY Left 06/07/2017  ? BREAST EXCISIONAL BIOPSY Right 2005  ? BREAST  LUMPECTOMY Right   ? COLONOSCOPY    ? LEFT OOPHORECTOMY    ? PARTIAL MASTECTOMY WITH NEEDLE LOCALIZATION AND AXILLARY SENTINEL LYMPH NODE BX Right 05/28/2013  ? Procedure: PARTIAL MASTECTOMY WITH NEEDLE LOCALIZATION AND AXILLARY SENTINEL LYMPH NODE BIOPSY;  Surgeon: Adin Hector, MD;  Location: [place];  Service: General;  Laterality: Right;  ? ?Family History  ?Problem Relation Age of Onset  ? Hypertension Mother   ? Diabetes Maternal Aunt   ? Stroke Other   ?     grandfather  ? Breast cancer Neg Hx   ? ?Social History  ? ?Socioeconomic History  ? Marital status: Divorced  ?  Spouse name: Not on file  ? Number of children: 2  ? Years of education: Not on file  ? Highest education level: Not on file  ?Occupational History  ?  Employer: RETIRED  ?Tobacco Use  ? Smoking status: Never  ? Smokeless tobacco: Never  ?Vaping Use  ? Vaping Use: Never used  ?Substance and Sexual Activity  ? Alcohol use: Yes  ?  Comment: occasionally  ? Drug use: No  ? Sexual activity: Not on file  ?Other Topics Concern  ? Not on file  ?Social History Narrative  ? Right handed, Caffeine 2-3 cups average daily.  Divorced, 2 daughters.     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    ? ?Social Determinants of Health  ? ?Financial Resource Strain: Low Risk   ? Difficulty of Paying Living Expenses: Not hard at all  ?Food Insecurity: No Food Insecurity  ? Worried About Charity fundraiser in the Last Year: Never true  ? Ran Out of Food in the Last Year: Never true  ?Transportation Needs: No Transportation Needs  ? Lack of Transportation (Medical): No  ? Lack of Transportation (Non-Medical): No  ?Physical Activity: Insufficiently Active  ? Days of Exercise per Week: 4 days  ? Minutes of Exercise per Session: 30 min  ?Stress: No Stress Concern Present  ? Feeling of Stress : Not at all  ?Social Connections: Moderately Integrated  ? Frequency of Communication with Friends and Family: Three times a week  ? Frequency of Social Gatherings with  Friends and Family: Three times a week  ? Attends Religious Services: More than 4 times per year  ? Active Member of Clubs or Organizations: Yes  ? Attends Archivist Meetings: More than 4 times per year  ? Marital Status: Divorced  ? ? ?Tobacco Counseling ?Counseling given: Not Answered ? ? ?Clinical Intake: ? ?Pre-visit preparation completed: Yes ? ?Pain : No/denies pain ? ?  ? ?Nutritional Risks: None ?Diabetes: No ? ?How often do you need to have someone help you when you read instructions, pamphlets, or other written materials from  your doctor or pharmacy?: 1 - Never ?What is the last grade level you completed in school?: PHD ? ?Diabetic?no ? ?Interpreter Needed?: No ? ?Information entered by :: [email or web address] ? ? ?Activities of Daily Living ? ?  08/12/2021  ?  1:28 PM 03/24/2021  ? 12:40 PM  ?In your present state of health, do you have any difficulty performing the following activities:  ?Hearing? 0 0  ?Vision? 0 0  ?Difficulty concentrating or making decisions? 0 0  ?Walking or climbing stairs? 0 0  ?Dressing or bathing? 0 0  ?Doing errands, shopping? 0 0  ?Preparing Food and eating ? N   ?Using the Toilet? N   ?In the past six months, have you accidently leaked urine? N   ?Do you have problems with loss of bowel control? N   ?Managing your Medications? N   ?Managing your Finances? N   ?Housekeeping or managing your Housekeeping? N   ? ? ?Patient Care Team: ?Midge Minium, MD as PCP - General ?Dohmeier, Asencion Partridge, MD as Consulting Physician (Neurology) ?Juanita Craver, MD as Consulting Physician (Gastroenterology) ?Magrinat, Virgie Dad, MD (Inactive) as Consulting Physician (Oncology) ?Roseanne Kaufman, MD as Consulting Physician (Orthopedic Surgery) ?Dimitri Ped, RN as Case Manager ? ?Indicate any recent Medical Services you may have received from other than [facility] providers in the past year (date may be approximate). ? ?   ?Assessment:  ? This is a routine wellness examination for  Mirinda. ? ?Hearing/Vision screen ?Vision Screening - Comments:: Annual eye exams wear glasses  ? ?Dietary issues and exercise activities discussed: ?Current Exercise Habits: Home exercise routine, Type of exercise: walking, Time

## 2021-08-31 ENCOUNTER — Other Ambulatory Visit: Payer: Self-pay

## 2021-08-31 DIAGNOSIS — M8000XG Age-related osteoporosis with current pathological fracture, unspecified site, subsequent encounter for fracture with delayed healing: Secondary | ICD-10-CM

## 2021-08-31 DIAGNOSIS — G5793 Unspecified mononeuropathy of bilateral lower limbs: Secondary | ICD-10-CM

## 2021-08-31 MED ORDER — ALENDRONATE SODIUM 70 MG PO TABS: ORAL_TABLET | ORAL | 0 refills | Status: DC

## 2021-09-01 ENCOUNTER — Other Ambulatory Visit: Payer: Self-pay

## 2021-09-01 DIAGNOSIS — M8000XG Age-related osteoporosis with current pathological fracture, unspecified site, subsequent encounter for fracture with delayed healing: Secondary | ICD-10-CM

## 2021-09-01 MED ORDER — ALENDRONATE SODIUM 70 MG PO TABS: ORAL_TABLET | ORAL | 0 refills | Status: DC

## 2021-10-06 ENCOUNTER — Ambulatory Visit (INDEPENDENT_AMBULATORY_CARE_PROVIDER_SITE_OTHER): Payer: Medicare PPO

## 2021-10-06 DIAGNOSIS — E785 Hyperlipidemia, unspecified: Secondary | ICD-10-CM

## 2021-10-06 DIAGNOSIS — M8000XG Age-related osteoporosis with current pathological fracture, unspecified site, subsequent encounter for fracture with delayed healing: Secondary | ICD-10-CM

## 2021-10-06 NOTE — Chronic Care Management (AMB) (Signed)
Chronic Care Management   CCM RN Visit Note  10/06/2021 Name: Monica Diaz MRN: [id number] DOB: [birth year]  Subjective: Monica Diaz is a 81 y.o. year old female who is a primary care patient of Birdie Riddle, Aundra Millet, MD. The care management team was consulted for assistance with disease management and care coordination needs.    Engaged with patient by telephone for follow up visit in response to provider referral for case management and/or care coordination services.   Consent to Services:  The patient was given information about Chronic Care Management services, agreed to services, and gave verbal consent prior to initiation of services.  Please see initial visit note for detailed documentation.   Patient agreed to services and verbal consent obtained.   Assessment: Review of patient past medical history, allergies, medications, health status, including review of consultants reports, laboratory and other test data, was performed as part of comprehensive evaluation and provision of chronic care management services.   SDOH (Social Determinants of Health) assessments and interventions performed:    CCM Care Plan  Allergies  Allergen Reactions   Codeine     REACTION: sick to stomach    Outpatient Encounter Medications as of 10/06/2021  Medication Sig   alendronate (FOSAMAX) 70 MG tablet TAKE 1 TABLET BY MOUTH 1 TIME EVERY WEEK WITH A FULL GLASS OF WATER AND ON AN EMPTY STOMACH   ARMOUR THYROID 60 MG tablet TAKE 1 TABLET BY MOUTH DAILY BEFORE BREAKFAST   azelastine (ASTELIN) 0.1 % nasal spray Place into both nostrils 2 (two) times daily. Use in each nostril as directed   calcium citrate-vitamin D (CITRACAL+D) 315-200 MG-UNIT per tablet Take 1 tablet by mouth 2 (two) times daily.   Coenzyme Q10 (COQ10) 200 MG CAPS Take 1 capsule by mouth at bedtime.   loratadine (CLARITIN) 10 MG tablet Take 10 mg by mouth daily as needed.    Multiple Vitamins-Minerals (ZINC PO) Take by  mouth.   Propylene Glycol (SYSTANE COMPLETE OP) Apply to eye.   pyridoxine (B-6) 100 MG tablet Take 100 mg by mouth 2 (two) times daily.   Red Yeast Rice Extract (CVS RED YEAST RICE) 600 MG CAPS Take 2 capsules (1,200 mg total) by mouth 2 (two) times daily.   vitamin B-12 (CYANOCOBALAMIN) 50 MCG tablet Take 50 mcg by mouth.   vitamin C (ASCORBIC ACID) 500 MG tablet Take 500 mg by mouth 2 (two) times daily.   No facility-administered encounter medications on file as of 10/06/2021.    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   Other neutropenia ([place]) 05/13/2011   General medical examination 05/13/2011   BUNION 03/25/2010   Hypothyroidism 12/02/2009   BENIGN NEOPLASM OF SKIN SITE UNSPECIFIED 02/10/2009   Vitamin D deficiency 02/10/2009   Hyperlipidemia 12/22/2008   RHINITIS 12/22/2008   NECK PAIN 12/22/2008   Osteoporosis 12/22/2008   VERTIGO 12/22/2008    Conditions to be addressed/monitored:HLD and Osteoporosis  Care Plan : RN Care Manager Plan of Care  Updates made by Dimitri Ped, RN since 10/06/2021 12:00 AM     Problem: Chronic Disease Management and Care  Coordination Needs (Osteoporosis, HLD)   Priority: High     Long-Range Goal: Establish Plan of Care for Chronic Disease Management Needs (Osteoporosis, HLD)   Start Date: 04/07/2021  Expected End Date: 04/04/2022  Priority: High  Note:   Case closed goals met Current Barriers:  Chronic Disease Management support and education needs related to HLD and Osteoporosis  States she has not been able to go to gym as she is  taking care of her 35 yr old Mother.  States she tries to get as much activity as she can around the house and when she goes out. States she is watching what she eats and is trying to eat more fruit and fiber.  Denies any  falls.  States her shoulder has not been bothering her recently  RNCM Clinical Goal(s):  Patient will verbalize understanding of plan for management of HLD and Osteoporosis as evidenced by voiced adherence to plan of care verbalize basic understanding of  HLD and Osteoporosis disease process and self health management plan as evidenced by voiced understanding and teach back take all medications exactly as prescribed and will call provider for medication related questions as evidenced by dispense report and pt verbalization attend all scheduled medical appointments: Oncology 07/18/22 as evidenced by medical records demonstrate Ongoing adherence to prescribed treatment plan for HLD and Osteoporosis as evidenced by readings within limits, voiced adherence to plan of care  through collaboration with RN Care manager, provider, and care team.   Interventions: 1:1 collaboration with primary care provider regarding development and update of comprehensive plan of care as evidenced by provider attestation and co-signature Inter-disciplinary care team collaboration (see longitudinal plan of care) Evaluation of current treatment plan related to  self management and patient's adherence to plan as established by provider   Osteoporosis  (Status:  Goal Met.)  Long Term Goal Evaluation of current treatment plan related to Osteoporosis,  self-management and patient's adherence to plan as established by provider. Discussed plans with patient for ongoing care management follow up and provided patient with direct contact information for care management team Evaluation of current treatment plan related to Osteoporosis and patient's adherence to plan as established by provider Advised patient to  continue to  exercise at the gym Provided education to patient re: pelvic exercises Discussed plans with patient for ongoing care management follow up and provided patient with direct contact information for care management team Reviewed to try to eat food with Vitamin D and calcium. Reviewed to do resistance training to help strengthen bones  Hyperlipidemia Interventions:  (Status:  Goal Met.) Long Term Goal Medication review performed; medication list updated in electronic medical record.  Provider established cholesterol goals reviewed Counseled on importance of regular laboratory monitoring as prescribed Reviewed importance of limiting foods high in cholesterol Reviewed exercise goals and target of 150 minutes per week Reviewed to eat more foods with more fiber to help with lipids. Reviewed how even 5-10 lb weight loss can help with lipids  Patient Goals/Self-Care Activities: Take all medications as prescribed Attend all scheduled provider appointments Call pharmacy for medication refills 3-7 days in advance of running out of medications Attend church or other social activities Perform all self care activities independently  Perform IADL's (shopping, preparing meals, housekeeping, managing finances) independently Call provider office for new concerns or questions  take all medications exactly as prescribed develop an exercise routine  Follow Up Plan:  The patient has been provided with contact information for the care management team and has been  advised to call with any health related questions or concerns.  No further follow up required: Case closed goals met       Plan:The patient has been provided with contact information for the care management team and has been advised to call with any health related questions or concerns.  No further follow up required: Case closed goals met Peter Garter RN, [facility], CDE Care Management Coordinator [facility] [phone]

## 2021-10-06 NOTE — Patient Instructions (Signed)
Visit Information  Thank you for allowing me to share the care management and care coordination services that are available to you as part of your health plan and services through your primary care provider and medical home. Please reach out to me at [phone] if the care management/care coordination team may be of assistance to you in the future.   Coen Miyasato RN, BSN,CCM, CDE Care Management Coordinator [facility] [phone]   

## 2021-10-18 ENCOUNTER — Other Ambulatory Visit: Payer: Self-pay

## 2021-10-18 DIAGNOSIS — E039 Hypothyroidism, unspecified: Secondary | ICD-10-CM

## 2021-10-18 MED ORDER — THYROID 60 MG PO TABS: 60.0000 mg | ORAL_TABLET | Freq: Every day | ORAL | 6 refills | Status: DC

## 2021-10-22 DIAGNOSIS — E785 Hyperlipidemia, unspecified: Secondary | ICD-10-CM

## 2021-10-22 DIAGNOSIS — M810 Age-related osteoporosis without current pathological fracture: Secondary | ICD-10-CM | POA: Diagnosis not present

## 2022-01-27 DIAGNOSIS — H43813 Vitreous degeneration, bilateral: Secondary | ICD-10-CM | POA: Diagnosis not present

## 2022-01-27 DIAGNOSIS — H53143 Visual discomfort, bilateral: Secondary | ICD-10-CM | POA: Diagnosis not present

## 2022-01-27 DIAGNOSIS — H52223 Regular astigmatism, bilateral: Secondary | ICD-10-CM | POA: Diagnosis not present

## 2022-01-27 DIAGNOSIS — H5213 Myopia, bilateral: Secondary | ICD-10-CM | POA: Diagnosis not present

## 2022-01-27 DIAGNOSIS — H524 Presbyopia: Secondary | ICD-10-CM | POA: Diagnosis not present

## 2022-01-27 DIAGNOSIS — H2513 Age-related nuclear cataract, bilateral: Secondary | ICD-10-CM | POA: Diagnosis not present

## 2022-02-14 ENCOUNTER — Other Ambulatory Visit: Payer: Self-pay

## 2022-02-14 DIAGNOSIS — M8000XG Age-related osteoporosis with current pathological fracture, unspecified site, subsequent encounter for fracture with delayed healing: Secondary | ICD-10-CM

## 2022-02-14 MED ORDER — ALENDRONATE SODIUM 70 MG PO TABS: ORAL_TABLET | ORAL | 1 refills | Status: DC

## 2022-04-28 ENCOUNTER — Other Ambulatory Visit: Payer: Self-pay | Admitting: Family Medicine

## 2022-04-28 DIAGNOSIS — E039 Hypothyroidism, unspecified: Secondary | ICD-10-CM

## 2022-05-03 ENCOUNTER — Other Ambulatory Visit: Payer: Self-pay | Admitting: Hematology and Oncology

## 2022-05-03 DIAGNOSIS — Z1231 Encounter for screening mammogram for malignant neoplasm of breast: Secondary | ICD-10-CM

## 2022-06-21 ENCOUNTER — Ambulatory Visit
Admission: RE | Admit: 2022-06-21 | Discharge: 2022-06-21 | Disposition: A | Discharge status: Home / Self Care | Payer: Medicare PPO | Source: Ambulatory Visit | Attending: Hematology and Oncology | Admitting: Hematology and Oncology

## 2022-06-21 ENCOUNTER — Ambulatory Visit: Payer: Medicare PPO

## 2022-06-21 DIAGNOSIS — Z1231 Encounter for screening mammogram for malignant neoplasm of breast: Secondary | ICD-10-CM | POA: Diagnosis not present

## 2022-07-05 ENCOUNTER — Telehealth: Payer: Self-pay | Admitting: Family Medicine

## 2022-07-05 NOTE — Telephone Encounter (Signed)
Reached out to patient to reschedule appointments due to [facility] being out of office, patient aware of date and time change.

## 2022-07-18 ENCOUNTER — Ambulatory Visit: Payer: Medicare PPO | Admitting: Hematology and Oncology

## 2022-07-18 ENCOUNTER — Other Ambulatory Visit: Payer: Medicare PPO

## 2022-08-03 ENCOUNTER — Other Ambulatory Visit: Payer: Self-pay | Admitting: *Deleted

## 2022-08-03 DIAGNOSIS — C50411 Malignant neoplasm of upper-outer quadrant of right female breast: Secondary | ICD-10-CM

## 2022-08-03 NOTE — Progress Notes (Signed)
Patient Care Team: Sheliah Hatch, MD as PCP - General Dohmeier, Porfirio Mylar, MD as Consulting Physician (Neurology) Charna Elizabeth, MD as Consulting Physician (Gastroenterology) Magrinat, Valentino Hue, MD (Inactive) as Consulting Physician (Oncology) Dominica Severin, MD as Consulting Physician (Orthopedic Surgery)  DIAGNOSIS: No diagnosis found.  SUMMARY OF ONCOLOGIC HISTORY: Oncology History   No history exists.    CHIEF COMPLIANT:   INTERVAL HISTORY: Monica Diaz is a   ALLERGIES:  is allergic to codeine.  MEDICATIONS:  Current Outpatient Medications  Medication Sig Dispense Refill   alendronate (FOSAMAX) 70 MG tablet TAKE 1 TABLET BY MOUTH 1 TIME EVERY WEEK WITH A FULL GLASS OF WATER AND ON AN EMPTY STOMACH 12 tablet 1   ARMOUR THYROID 60 MG tablet TAKE 1 TABLET BY MOUTH DAILY BEFORE BREAKFAST 30 tablet 6   azelastine (ASTELIN) 0.1 % nasal spray Place into both nostrils 2 (two) times daily. Use in each nostril as directed     calcium citrate-vitamin D (CITRACAL+D) 315-200 MG-UNIT per tablet Take 1 tablet by mouth 2 (two) times daily.     Coenzyme Q10 (COQ10) 200 MG CAPS Take 1 capsule by mouth at bedtime.     loratadine (CLARITIN) 10 MG tablet Take 10 mg by mouth daily as needed.      Multiple Vitamins-Minerals (ZINC PO) Take by mouth.     Propylene Glycol (SYSTANE COMPLETE OP) Apply to eye.     pyridoxine (B-6) 100 MG tablet Take 100 mg by mouth 2 (two) times daily.     Red Yeast Rice Extract (CVS RED YEAST RICE) 600 MG CAPS Take 2 capsules (1,200 mg total) by mouth 2 (two) times daily.  0   vitamin B-12 (CYANOCOBALAMIN) 50 MCG tablet Take 50 mcg by mouth.     vitamin C (ASCORBIC ACID) 500 MG tablet Take 500 mg by mouth 2 (two) times daily.     No current facility-administered medications for this visit.    PHYSICAL EXAMINATION: ECOG PERFORMANCE STATUS: {CHL ONC ECOG [phone]}  There were no vitals filed for this visit. There were no vitals filed for this  visit.  BREAST:*** No palpable masses or nodules in either right or left breasts. No palpable axillary supraclavicular or infraclavicular adenopathy no breast tenderness or nipple discharge. (exam performed in the presence of a chaperone)  LABORATORY DATA:  I have reviewed the data as listed    Latest Ref Rng & Units 07/15/2021   12:48 PM 01/26/2021   11:57 AM 06/16/2020    1:07 PM  CMP  Glucose 70 - 99 mg/dL 818  80  64   BUN 8 - 23 mg/dL 13  13  9    Creatinine 0.44 - 1.00 mg/dL 2.99  3.71  6.96   Sodium 135 - 145 mmol/L 138  139  137   Potassium 3.5 - 5.1 mmol/L 4.1  4.0  4.0   Chloride 98 - 111 mmol/L 106  105  106   CO2 22 - 32 mmol/L 28  26  25    Calcium 8.9 - 10.3 mg/dL 9.4  9.3  8.9   Total Protein 6.5 - 8.1 g/dL 7.4  7.1  7.5   Total Bilirubin 0.3 - 1.2 mg/dL 0.5  0.3  0.3   Alkaline Phos 38 - 126 U/L 40  46  41   AST 15 - 41 U/L 20  17  22    ALT 0 - 44 U/L 14  13  17      Lab Results  Component  Value Date   WBC 5.1 07/15/2021   HGB 11.4 (L) 07/15/2021   HCT 35.1 (L) 07/15/2021   MCV 91.4 07/15/2021   PLT 244 07/15/2021   NEUTROABS 2.4 07/15/2021    ASSESSMENT & PLAN:  No problem-specific Assessment & Plan notes found for this encounter.    No orders of the defined types were placed in this encounter.  The patient has a good understanding of the overall plan. she agrees with it. she will call with any problems that may develop before the next visit here. Total time spent: 30 mins including face to face time and time spent for planning, charting and co-ordination of care   Sherlyn LickDeritra L Eon Zunker, CMA 08/03/22    I Janan Ridgeeritra, Ailene Royal am acting as a Neurosurgeonscribe for [employer] Service[employer] CompanyDr.Vinay Gudena  ***

## 2022-08-04 ENCOUNTER — Inpatient Hospital Stay: Payer: Medicare PPO | Attending: Nurse Practitioner

## 2022-08-04 ENCOUNTER — Other Ambulatory Visit: Payer: Self-pay

## 2022-08-04 ENCOUNTER — Inpatient Hospital Stay: Payer: Medicare PPO | Admitting: Hematology and Oncology

## 2022-08-04 VITALS — BP 142/85 | HR 68 | Temp 97.7°F | Resp 18 | Ht 64.0 in | Wt 165.1 lb

## 2022-08-04 DIAGNOSIS — Z170 Estrogen receptor positive status [ER+]: Secondary | ICD-10-CM

## 2022-08-04 DIAGNOSIS — C50411 Malignant neoplasm of upper-outer quadrant of right female breast: Secondary | ICD-10-CM | POA: Diagnosis not present

## 2022-08-04 LAB — CBC WITH DIFFERENTIAL (CANCER CENTER ONLY)
Abs Immature Granulocytes: 0.01 10*3/uL (ref 0.00–0.07)
Basophils Absolute: 0 10*3/uL (ref 0.0–0.1)
Basophils Relative: 0 %
Eosinophils Absolute: 0.1 10*3/uL (ref 0.0–0.5)
Eosinophils Relative: 1 %
HCT: 33.7 % — ABNORMAL LOW (ref 36.0–46.0)
Hemoglobin: 11.4 g/dL — ABNORMAL LOW (ref 12.0–15.0)
Immature Granulocytes: 0 %
Lymphocytes Relative: 45 %
Lymphs Abs: 2.4 10*3/uL (ref 0.7–4.0)
MCH: 30.9 pg (ref 26.0–34.0)
MCHC: 33.8 g/dL (ref 30.0–36.0)
MCV: 91.3 fL (ref 80.0–100.0)
Monocytes Absolute: 0.6 10*3/uL (ref 0.1–1.0)
Monocytes Relative: 12 %
Neutro Abs: 2.2 10*3/uL (ref 1.7–7.7)
Neutrophils Relative %: 42 %
Platelet Count: 219 10*3/uL (ref 150–400)
RBC: 3.69 MIL/uL — ABNORMAL LOW (ref 3.87–5.11)
RDW: 14.6 % (ref 11.5–15.5)
WBC Count: 5.3 10*3/uL (ref 4.0–10.5)
nRBC: 0 % (ref 0.0–0.2)

## 2022-08-04 LAB — CMP (CANCER CENTER ONLY)
ALT: 11 U/L (ref 0–44)
AST: 15 U/L (ref 15–41)
Albumin: 3.9 g/dL (ref 3.5–5.0)
Alkaline Phosphatase: 36 U/L — ABNORMAL LOW (ref 38–126)
Anion gap: 5 (ref 5–15)
BUN: 12 mg/dL (ref 8–23)
CO2: 27 mmol/L (ref 22–32)
Calcium: 9.5 mg/dL (ref 8.9–10.3)
Chloride: 107 mmol/L (ref 98–111)
Creatinine: 1.11 mg/dL — ABNORMAL HIGH (ref 0.44–1.00)
GFR, Estimated: 50 mL/min — ABNORMAL LOW (ref >60)
Glucose, Bld: 86 mg/dL (ref 70–99)
Potassium: 3.9 mmol/L (ref 3.5–5.1)
Sodium: 139 mmol/L (ref 135–145)
Total Bilirubin: 0.4 mg/dL (ref 0.3–1.2)
Total Protein: 7.2 g/dL (ref 6.5–8.1)

## 2022-08-04 NOTE — Assessment & Plan Note (Addendum)
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  Breast exam 08/04/2022: Benign 2. mammogram 06/23/2022: Benign breast density category B   Return to clinic in 1 year for follow-up and after that she could be seen on an as-needed basis

## 2022-08-05 ENCOUNTER — Other Ambulatory Visit: Payer: Self-pay | Admitting: Family Medicine

## 2022-08-05 DIAGNOSIS — M8000XG Age-related osteoporosis with current pathological fracture, unspecified site, subsequent encounter for fracture with delayed healing: Secondary | ICD-10-CM

## 2022-08-08 ENCOUNTER — Other Ambulatory Visit: Payer: Self-pay

## 2022-08-08 DIAGNOSIS — M8000XG Age-related osteoporosis with current pathological fracture, unspecified site, subsequent encounter for fracture with delayed healing: Secondary | ICD-10-CM

## 2022-08-08 MED ORDER — ALENDRONATE SODIUM 70 MG PO TABS: ORAL_TABLET | ORAL | 0 refills | Status: DC

## 2022-08-25 ENCOUNTER — Encounter: Payer: Self-pay | Admitting: Hematology and Oncology

## 2022-09-12 DIAGNOSIS — M1990 Unspecified osteoarthritis, unspecified site: Secondary | ICD-10-CM | POA: Diagnosis not present

## 2022-09-12 DIAGNOSIS — J309 Allergic rhinitis, unspecified: Secondary | ICD-10-CM | POA: Diagnosis not present

## 2022-09-12 DIAGNOSIS — K219 Gastro-esophageal reflux disease without esophagitis: Secondary | ICD-10-CM | POA: Diagnosis not present

## 2022-09-12 DIAGNOSIS — I739 Peripheral vascular disease, unspecified: Secondary | ICD-10-CM | POA: Diagnosis not present

## 2022-09-12 DIAGNOSIS — I10 Essential (primary) hypertension: Secondary | ICD-10-CM | POA: Diagnosis not present

## 2022-09-12 DIAGNOSIS — E785 Hyperlipidemia, unspecified: Secondary | ICD-10-CM | POA: Diagnosis not present

## 2022-09-12 DIAGNOSIS — M810 Age-related osteoporosis without current pathological fracture: Secondary | ICD-10-CM | POA: Diagnosis not present

## 2022-09-12 DIAGNOSIS — G4733 Obstructive sleep apnea (adult) (pediatric): Secondary | ICD-10-CM | POA: Diagnosis not present

## 2022-09-12 DIAGNOSIS — E039 Hypothyroidism, unspecified: Secondary | ICD-10-CM | POA: Diagnosis not present

## 2022-09-13 ENCOUNTER — Telehealth: Payer: Self-pay | Admitting: Family Medicine

## 2022-09-13 NOTE — Telephone Encounter (Signed)
I have not seen pt in 18 months.  Pt needs appt

## 2022-09-13 NOTE — Telephone Encounter (Signed)
Caller name: EABHA ORBE  On DPR?: Yes  Call back number: [phone] (mobile)  Provider they see: Sheliah Hatch, MD  Reason for call: Pt had home visit [facility] RN and told her vascular severe 0.0-0.29 PAD needs a referral.

## 2022-09-14 NOTE — Telephone Encounter (Signed)
Pt has appt 5/30

## 2022-09-22 ENCOUNTER — Ambulatory Visit: Payer: Medicare PPO | Admitting: Family Medicine

## 2022-09-22 ENCOUNTER — Encounter: Payer: Self-pay | Admitting: Family Medicine

## 2022-09-22 VITALS — BP 122/68 | HR 86 | Temp 97.9°F | Resp 18 | Ht 64.0 in | Wt 163.4 lb

## 2022-09-22 DIAGNOSIS — E039 Hypothyroidism, unspecified: Secondary | ICD-10-CM | POA: Diagnosis not present

## 2022-09-22 DIAGNOSIS — I739 Peripheral vascular disease, unspecified: Secondary | ICD-10-CM | POA: Insufficient documentation

## 2022-09-22 DIAGNOSIS — E785 Hyperlipidemia, unspecified: Secondary | ICD-10-CM

## 2022-09-22 LAB — BASIC METABOLIC PANEL
BUN: 10 mg/dL (ref 6–23)
CO2: 28 mEq/L (ref 19–32)
Calcium: 9.3 mg/dL (ref 8.4–10.5)
Chloride: 105 mEq/L (ref 96–112)
Creatinine, Ser: 0.9 mg/dL (ref 0.40–1.20)
GFR: 60.45 mL/min (ref >60.00)
Glucose, Bld: 76 mg/dL (ref 70–99)
Potassium: 4.1 mEq/L (ref 3.5–5.1)
Sodium: 140 mEq/L (ref 135–145)

## 2022-09-22 LAB — HEPATIC FUNCTION PANEL
ALT: 12 U/L (ref 0–35)
AST: 16 U/L (ref 0–37)
Albumin: 4 g/dL (ref 3.5–5.2)
Alkaline Phosphatase: 37 U/L — ABNORMAL LOW (ref 39–117)
Bilirubin, Direct: 0 mg/dL (ref 0.0–0.3)
Total Bilirubin: 0.4 mg/dL (ref 0.2–1.2)
Total Protein: 7.7 g/dL (ref 6.0–8.3)

## 2022-09-22 LAB — CBC WITH DIFFERENTIAL/PLATELET
Basophils Absolute: 0 10*3/uL (ref 0.0–0.1)
Basophils Relative: 0.2 % (ref 0.0–3.0)
Eosinophils Absolute: 0.1 10*3/uL (ref 0.0–0.7)
Eosinophils Relative: 1.2 % (ref 0.0–5.0)
HCT: 36.2 % (ref 36.0–46.0)
Hemoglobin: 11.8 g/dL — ABNORMAL LOW (ref 12.0–15.0)
Lymphocytes Relative: 40.2 % (ref 12.0–46.0)
Lymphs Abs: 2 10*3/uL (ref 0.7–4.0)
MCHC: 32.7 g/dL (ref 30.0–36.0)
MCV: 92.3 fl (ref 78.0–100.0)
Monocytes Absolute: 0.6 10*3/uL (ref 0.1–1.0)
Monocytes Relative: 12.5 % — ABNORMAL HIGH (ref 3.0–12.0)
Neutro Abs: 2.2 10*3/uL (ref 1.4–7.7)
Neutrophils Relative %: 45.9 % (ref 43.0–77.0)
Platelets: 264 10*3/uL (ref 150.0–400.0)
RBC: 3.92 Mil/uL (ref 3.87–5.11)
RDW: 15.4 % (ref 11.5–15.5)
WBC: 4.9 10*3/uL (ref 4.0–10.5)

## 2022-09-22 LAB — TSH: TSH: 1.06 u[IU]/mL (ref 0.35–5.50)

## 2022-09-22 LAB — LIPID PANEL
Cholesterol: 186 mg/dL (ref 0–200)
HDL: 61 mg/dL (ref >39.00)
LDL Cholesterol: 108 mg/dL — ABNORMAL HIGH (ref 0–99)
NonHDL: 124.76
Total CHOL/HDL Ratio: 3
Triglycerides: 82 mg/dL (ref 0.0–149.0)
VLDL: 16.4 mg/dL (ref 0.0–40.0)

## 2022-09-22 NOTE — Assessment & Plan Note (Signed)
Chronic problem.  Currently asymptomatic on Armour Thyroid 60mg  daily.  Check labs.  Adjust meds prn

## 2022-09-22 NOTE — Assessment & Plan Note (Signed)
New.  There are conflicting reports regarding this.  She had a [facility] screen that said she was in the normal range and not at risk but had a home visit from [facility] in which she was told her L leg has 'severe' PAD.  Pt is currently asymptomatic.  Will refer to Vascular for complete evaluation and clarification.  Pt expressed understanding and is in agreement w/ plan.

## 2022-09-22 NOTE — Assessment & Plan Note (Signed)
Chronic problem.  Currently on Red Yeast Rice.  Has not had lipids checked in 18months.  Check labs.  Adjust meds prn

## 2022-09-22 NOTE — Progress Notes (Signed)
   Subjective:    Patient ID: Monica Diaz, female    DOB: Sep 10, 1940, 82 y.o.   MRN: [id number]  HPI Hypothyroid- chronic problem, on Armour Thyroid 60mg  daily.  Has not had TSH done since 01/2021.  Pt reports energy level is 'great'- no changes.    Hyperlipidemia- chronic problem, currently on Red Yeast Rice.  Last lipid panel was Oct 2022.  No CP, SOB, abd pain, N/V.  PAD- new.  Pt had a home nurse visit from [facility] and was told she had L ABI of 0-0.29 and that her readings are severe.  They told her she needed referral to vascular.  In April she had a [facility] screen that showed no issues w/ PAD in either leg.  Results are conflicting and confusing.  Not having any pain or swelling.  No skin discoloration.   Review of Systems For ROS see HPI     Objective:   Physical Exam Vitals reviewed.  Constitutional:      General: She is not in acute distress.    Appearance: Normal appearance. She is well-developed. She is not ill-appearing.  HENT:     Head: Normocephalic and atraumatic.  Eyes:     Conjunctiva/sclera: Conjunctivae normal.     Pupils: Pupils are equal, round, and reactive to light.  Neck:     Thyroid: No thyromegaly.  Cardiovascular:     Rate and Rhythm: Normal rate and regular rhythm.     Pulses: Normal pulses.     Heart sounds: Normal heart sounds. No murmur heard. Pulmonary:     Effort: Pulmonary effort is normal. No respiratory distress.     Breath sounds: Normal breath sounds.  Abdominal:     General: There is no distension.     Palpations: Abdomen is soft.     Tenderness: There is no abdominal tenderness.  Musculoskeletal:     Cervical back: Normal range of motion and neck supple.     Right lower leg: No edema.     Left lower leg: No edema.  Lymphadenopathy:     Cervical: No cervical adenopathy.  Skin:    General: Skin is warm and dry.  Neurological:     General: No focal deficit present.     Mental Status: She is alert and oriented to  person, place, and time.  Psychiatric:        Mood and Affect: Mood normal.        Behavior: Behavior normal.        Thought Content: Thought content normal.           Assessment & Plan:

## 2022-09-22 NOTE — Patient Instructions (Addendum)
Schedule your complete physical in 6 months We'll notify you of your lab results and make any changes if needed Keep up the good work on healthy diet and regular exercise- you look great! Make sure you are drinking LOTS of water We'll call you to schedule your vascular appt to figure out which test is right Call with any questions or concerns Stay Safe!  Stay Healthy! Have a great summer!!!

## 2022-09-23 ENCOUNTER — Telehealth: Payer: Self-pay

## 2022-09-23 NOTE — Telephone Encounter (Signed)
Pt is aware of lab results.

## 2022-09-23 NOTE — Telephone Encounter (Signed)
-----   Message from Sheliah Hatch, MD sent at 09/23/2022  7:28 AM EDT ----- Your creatinine and GFR are back in normal range- great news!  Cholesterol is fantastic!  No changes at this time  Liver functions look great!  The slightly low Alk Phos is nothing to worry about  Blood counts look great!  Overall, labs look fantastic and no changes are needed

## 2022-09-28 ENCOUNTER — Ambulatory Visit (INDEPENDENT_AMBULATORY_CARE_PROVIDER_SITE_OTHER): Payer: Medicare PPO | Admitting: *Deleted

## 2022-09-28 ENCOUNTER — Other Ambulatory Visit: Payer: Self-pay | Admitting: *Deleted

## 2022-09-28 DIAGNOSIS — Z0000 Encounter for general adult medical examination without abnormal findings: Secondary | ICD-10-CM

## 2022-09-28 DIAGNOSIS — I739 Peripheral vascular disease, unspecified: Secondary | ICD-10-CM

## 2022-09-28 NOTE — Progress Notes (Signed)
Subjective:   Monica Diaz is a 82 y.o. female who presents for Medicare Annual (Subsequent) preventive examination.  I connected with  Dorathy Daft on 09/28/22 by a telephone enabled telemedicine application and verified that I am speaking with the correct person using two identifiers.   I discussed the limitations of evaluation and management by telemedicine. The patient expressed understanding and agreed to proceed.  Patient location: home  Provider location:  telephone/home  .   Review of Systems     Cardiac Risk Factors include: advanced age (>24men, >73 women);obesity (BMI >30kg/m2)     Objective:    Today's Vitals   There is no height or weight on file to calculate BMI.     09/28/2022    2:05 PM 08/12/2021    1:25 PM 01/27/2021    1:27 PM 07/27/2020    1:43 PM 09/14/2016   12:16 PM 12/17/2015    3:55 PM 05/26/2015   11:15 AM  Advanced Directives  Does Patient Have a Medical Advance Directive? Yes No Yes Yes Yes Yes Yes  Type of Forensic scientist of [employer] Power of [place];Living will Healthcare Power of [place];Living will Living will;Healthcare Power of Attorney   Copy of Healthcare Power of Attorney in Chart? Yes - validated most recent copy scanned in chart (See row information)  No - copy requested Yes - validated most recent copy scanned in chart (See row information) No - copy requested Yes   Would patient like information on creating a medical advance directive?  No - Patient declined         Current Medications (verified) Outpatient Encounter Medications as of 09/28/2022  Medication Sig   alendronate (FOSAMAX) 70 MG tablet TAKE 1 TABLET BY MOUTH 1 TIME EVERY WEEK WITH A FULL GLASS OF WATER AND ON AN EMPTY STOMACH   ARMOUR THYROID 60 MG tablet TAKE 1 TABLET BY MOUTH DAILY BEFORE BREAKFAST   azelastine (ASTELIN) 0.1 % nasal spray Place into both nostrils 2 (two) times daily. Use in each  nostril as directed   calcium citrate-vitamin D (CITRACAL+D) 315-200 MG-UNIT per tablet Take 1 tablet by mouth 2 (two) times daily.   Coenzyme Q10 (COQ10) 200 MG CAPS Take 1 capsule by mouth at bedtime.   Multiple Vitamins-Minerals (ZINC PO) Take by mouth.   Propylene Glycol (SYSTANE COMPLETE OP) Apply to eye.   pyridoxine (B-6) 100 MG tablet Take 100 mg by mouth 2 (two) times daily.   Red Yeast Rice Extract ([employer] RED YEAST RICE) 600 MG CAPS Take 2 capsules (1,200 mg total) by mouth 2 (two) times daily.   vitamin B-12 (CYANOCOBALAMIN) 50 MCG tablet Take 50 mcg by mouth.   vitamin C (ASCORBIC ACID) 500 MG tablet Take 500 mg by mouth 2 (two) times daily.   No facility-administered encounter medications on file as of 09/28/2022.    Allergies (verified) Codeine   History: Past Medical History:  Diagnosis Date   Arthritis    Breast cancer (HCC) 2015   Cancer (HCC) 2015   breast   HOH (hard of hearing)    Hx of radiation therapy 07/18/13-08/14/13   right breast total dose 50 Gy   Hyperlipidemia    Osteoporosis    Personal history of radiation therapy 2015   Seasonal allergies    Sleep apnea    uses a cpap   Thyroid disease    Wears glasses    Past Surgical History:  Procedure Laterality  Date   ABDOMINAL HYSTERECTOMY     fibroids   BREAST BIOPSY Right 2015   core    BREAST BIOPSY Left 06/18/2018   DENSE FIBROSIS WITH CALCIFICATIONS AND CHRONIC   BREAST BIOPSY Left 06/07/2017   BREAST EXCISIONAL BIOPSY Right 2005   BREAST LUMPECTOMY Right    COLONOSCOPY     LEFT OOPHORECTOMY     PARTIAL MASTECTOMY WITH NEEDLE LOCALIZATION AND AXILLARY SENTINEL LYMPH NODE BX Right 05/28/2013   Procedure: PARTIAL MASTECTOMY WITH NEEDLE LOCALIZATION AND AXILLARY SENTINEL LYMPH NODE BIOPSY;  Surgeon: Ernestene Mention, MD;  Location: [facility];  Service: General;  Laterality: Right;   Family History  Problem Relation Age of Onset   Hypertension Mother    Diabetes Maternal Aunt     Stroke Other        grandfather   Breast cancer Neg Hx    Social History   Socioeconomic History   Marital status: Divorced    Spouse name: Not on file   Number of children: 2   Years of education: Not on file   Highest education level: Doctorate  Occupational History    Employer: RETIRED  Tobacco Use   Smoking status: Never   Smokeless tobacco: Never  Vaping Use   Vaping Use: Never used  Substance and Sexual Activity   Alcohol use: Yes    Comment: occasionally   Drug use: No   Sexual activity: Not Currently  Other Topics Concern   Not on file  Social History Narrative   Right handed, Caffeine 2-3 cups average daily.  Divorced, 2 daughters.     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     Social Determinants of Health   Financial Resource Strain: Low Risk  (09/28/2022)   Overall Financial Resource Strain (CARDIA)    Difficulty of Paying Living Expenses: Not hard at all  Food Insecurity: No Food Insecurity (09/28/2022)   Hunger Vital Sign    Worried About Running Out of Food in the Last Year: Never true    Ran Out of Food in the Last Year: Never true  Transportation Needs: No Transportation Needs (09/28/2022)   PRAPARE - Administrator, Civil Service (Medical): No    Lack of Transportation (Non-Medical): No  Physical Activity: Inactive (09/28/2022)   Exercise Vital Sign    Days of Exercise per Week: 0 days    Minutes of Exercise per Session: 0 min  Stress: No Stress Concern Present (09/20/2022)   [employer] of Occupational Health - Occupational Stress Questionnaire    Feeling of Stress : Not at all  Social Connections: Moderately Integrated (09/28/2022)   Social Connection and Isolation Panel [NHANES]    Frequency of Communication with Friends and Family: More than three times a week    Frequency of Social Gatherings with Friends and Family: Once a week    Attends Religious Services: More than 4 times per year    Active Member of [employer] or Organizations: Yes     Attends Engineer, structural: More than 4 times per year    Marital Status: Divorced    Tobacco Counseling Counseling given: Not Answered   Clinical Intake:  Pre-visit preparation completed: Yes  Pain : No/denies pain        How often do you need to have someone help you when you read instructions, pamphlets, or other written materials from your doctor or pharmacy?: 1 - Never  Diabetic?  no  Interpreter Needed?: No  Information  entered by :: Remi Haggard LPN   Activities of Daily Living    09/28/2022    2:10 PM  In your present state of health, do you have any difficulty performing the following activities:  Hearing? 1  Vision? 1  Difficulty concentrating or making decisions? 0  Walking or climbing stairs? 0  Dressing or bathing? 0  Doing errands, shopping? 0  Preparing Food and eating ? N  Using the Toilet? N  In the past six months, have you accidently leaked urine? N  Do you have problems with loss of bowel control? N  Managing your Medications? N  Managing your Finances? N  Housekeeping or managing your Housekeeping? N    Patient Care Team: Sheliah Hatch, MD as PCP - General Dohmeier, Porfirio Mylar, MD as Consulting Physician (Neurology) Charna Elizabeth, MD as Consulting Physician (Gastroenterology) Magrinat, Valentino Hue, MD (Inactive) as Consulting Physician (Oncology) Dominica Severin, MD as Consulting Physician (Orthopedic Surgery)  Indicate any recent Medical Services you may have received from other than [facility] providers in the past year (date may be approximate).     Assessment:   This is a routine wellness examination for Aslan.  Hearing/Vision screen Hearing Screening - Comments:: Yes  50%  R Ear no hearing aid Vision Screening - Comments:: Up to date [place]  Dietary issues and exercise activities discussed: Current Exercise Habits: The patient does not participate in regular exercise at present   Goals Addressed             This  Visit's Progress    Weight (lb) < 200 lb (90.7 kg)         Depression Screen    09/28/2022    2:15 PM 09/22/2022   10:14 AM 08/12/2021    1:26 PM 08/12/2021    1:24 PM 03/24/2021   12:41 PM 01/27/2021    1:23 PM 01/26/2021   11:30 AM  PHQ 2/9 Scores  PHQ - 2 Score 0 0 0 0 0 0 0  PHQ- 9 Score 0 0   0  0    Fall Risk    09/28/2022    2:05 PM 09/22/2022   10:14 AM 08/12/2021    1:26 PM 03/24/2021   12:41 PM 01/27/2021    1:19 PM  Fall Risk   Falls in the past year? 0 0 0 1 1  Number falls in past yr: 0  0 0 0  Comment     fell playing basketball cut back of head and sore bottom  Injury with Fall? 0 0 0 0 1  Risk for fall due to :  No Fall Risks  History of fall(s) History of fall(s)  Follow up Falls evaluation completed;Education provided;Falls prevention discussed Falls evaluation completed Falls evaluation completed Falls evaluation completed Education provided;Falls prevention discussed    FALL RISK PREVENTION PERTAINING TO THE HOME:  Any stairs in or around the home? Yes  If so, are there any without handrails? No  Home free of loose throw rugs in walkways, pet beds, electrical cords, etc? Yes  Adequate lighting in your home to reduce risk of falls? Yes   ASSISTIVE DEVICES UTILIZED TO PREVENT FALLS:  Life alert? No  Use of a cane, walker or w/c? No  Grab bars in the bathroom? Yes  Shower chair or bench in shower? Yes  Elevated toilet seat or a handicapped toilet? Yes   TIMED UP AND GO:  Was the test performed? No .    Cognitive Function:  09/28/2022    2:11 PM 07/27/2020    1:56 PM  6CIT Screen  What Year? 0 points 0 points  What month? 0 points 0 points  What time? 0 points 0 points  Count back from 20 0 points 0 points  Months in reverse 0 points 0 points  Repeat phrase 0 points 0 points  Total Score 0 points 0 points    Immunizations Immunization History  Administered Date(s) Administered   Fluad Quad(high Dose 65+) 01/26/2021   Influenza, High  Dose Seasonal PF 02/22/2018, 03/06/2019   Influenza,inj,Quad PF,6+ Mos 03/13/2017   PFIZER(Purple Top)SARS-COV-2 Vaccination 05/10/2019, 05/30/2019, 02/11/2020, 08/24/2020   Pfizer Covid-19 Vaccine Bivalent Booster 11yrs & up 02/08/2022   Pneumococcal Conjugate-13 01/02/2014   Pneumococcal Polysaccharide-23 02/27/2015   Td 03/18/2014   Zoster Recombinat (Shingrix) 11/29/2020, 03/23/2021    TDAP status: Up to date  Flu Vaccine status: Up to date  Pneumococcal vaccine status: Up to date  Covid-19 vaccine status: Information provided on how to obtain vaccines.   Qualifies for Shingles Vaccine? Yes   Zostavax completed No   Shingrix Completed?: Yes  Screening Tests Health Maintenance  Topic Date Due   INFLUENZA VACCINE  11/24/2022   MAMMOGRAM  06/22/2023   Medicare Annual Wellness (AWV)  09/28/2023   DTaP/Tdap/Td (2 - Tdap) 03/18/2024   Pneumonia Vaccine 103+ Years old  Completed   DEXA SCAN  Completed   Zoster Vaccines- Shingrix  Completed   HPV VACCINES  Aged Out   COVID-19 Vaccine  Discontinued    Health Maintenance  There are no preventive care reminders to display for this patient.   Colorectal cancer screening: No longer required.   Mammogram status: Completed  . Repeat every year  Bone Density status: Completed 2022. Results reflect: Bone density results: OSTEOPOROSIS. Repeat every 2 years.  Lung Cancer Screening: (Low Dose CT Chest recommended if Age 62-80 years, 30 pack-year currently smoking OR have quit w/in 15years.) does not qualify.   Lung Cancer Screening Referral:   Additional Screening:  Hepatitis C Screening  never done  Vision Screening: Recommended annual ophthalmology exams for early detection of glaucoma and other disorders of the eye. Is the patient up to date with their annual eye exam?  Yes  Who is the provider or what is the name of the office in which the patient attends annual eye exams? [facility] If pt is not established with a provider,  would they like to be referred to a provider to establish care? No .   Dental Screening: Recommended annual dental exams for proper oral hygiene  Community Resource Referral / Chronic Care Management: CRR required this visit?  No   CCM required this visit?  No      Plan:     I have personally reviewed and noted the following in the patient's chart:   Medical and social history Use of alcohol, tobacco or illicit drugs  Current medications and supplements including opioid prescriptions. Patient is not currently taking opioid prescriptions. Functional ability and status Nutritional status Physical activity Advanced directives List of other physicians Hospitalizations, surgeries, and ER visits in previous 12 months Vitals Screenings to include cognitive, depression, and falls Referrals and appointments  In addition, I have reviewed and discussed with patient certain preventive protocols, quality metrics, and best practice recommendations. A written personalized care plan for preventive services as well as general preventive health recommendations were provided to patient.     Remi Haggard, LPN   04/30/1094   Nurse Notes:

## 2022-09-28 NOTE — Patient Instructions (Signed)
[name] , Thank you for taking time to come for your Medicare Wellness Visit. I appreciate your ongoing commitment to your health goals. Please review the following plan we discussed and let me know if I can assist you in the future.   Screening recommendations/referrals: Colonoscopy: no longer required Mammogram: up to date Bone Density: Education provided Recommended yearly ophthalmology/optometry visit for glaucoma screening and checkup Recommended yearly dental visit for hygiene and checkup  Vaccinations: Influenza vaccine: up to date Pneumococcal vaccine: Education provided Tdap vaccine: up to date Shingles vaccine: up to date    Advanced directives: on file     Preventive Care 65 Years and Older, Female Preventive care refers to lifestyle choices and visits with your health care provider that can promote health and wellness. What does preventive care include? A yearly physical exam. This is also called an annual well check. Dental exams once or twice a year. Routine eye exams. Ask your health care provider how often you should have your eyes checked. Personal lifestyle choices, including: Daily care of your teeth and gums. Regular physical activity. Eating a healthy diet. Avoiding tobacco and drug use. Limiting alcohol use. Practicing safe sex. Taking low-dose aspirin every day. Taking vitamin and mineral supplements as recommended by your health care provider. What happens during an annual well check? The services and screenings done by your health care provider during your annual well check will depend on your age, overall health, lifestyle risk factors, and family history of disease. Counseling  Your health care provider may ask you questions about your: Alcohol use. Tobacco use. Drug use. Emotional well-being. Home and relationship well-being. Sexual activity. Eating habits. History of falls. Memory and ability to understand (cognition). Work and work  Astronomer. Reproductive health. Screening  You may have the following tests or measurements: Height, weight, and BMI. Blood pressure. Lipid and cholesterol levels. These may be checked every 5 years, or more frequently if you are over 41 years old. Skin check. Lung cancer screening. You may have this screening every year starting at age 82 if you have a 30-pack-year history of smoking and currently smoke or have quit within the past 15 years. Fecal occult blood test (FOBT) of the stool. You may have this test every year starting at age 85. Flexible sigmoidoscopy or colonoscopy. You may have a sigmoidoscopy every 5 years or a colonoscopy every 10 years starting at age 61. Hepatitis C blood test. Hepatitis B blood test. Sexually transmitted disease (STD) testing. Diabetes screening. This is done by checking your blood sugar (glucose) after you have not eaten for a while (fasting). You may have this done every 1-3 years. Bone density scan. This is done to screen for osteoporosis. You may have this done starting at age 28. Mammogram. This may be done every 1-2 years. Talk to your health care provider about how often you should have regular mammograms. Talk with your health care provider about your test results, treatment options, and if necessary, the need for more tests. Vaccines  Your health care provider may recommend certain vaccines, such as: Influenza vaccine. This is recommended every year. Tetanus, diphtheria, and acellular pertussis (Tdap, Td) vaccine. You may need a Td booster every 10 years. Zoster vaccine. You may need this after age 31. Pneumococcal 13-valent conjugate (PCV13) vaccine. One dose is recommended after age 39. Pneumococcal polysaccharide (PPSV23) vaccine. One dose is recommended after age 62. Talk to your health care provider about which screenings and vaccines you need and how often  you need them. This information is not intended to replace advice given to you by  your health care provider. Make sure you discuss any questions you have with your health care provider. Document Released: 05/08/2015 Document Revised: 12/30/2015 Document Reviewed: 02/10/2015 Elsevier Interactive Patient Education  2017 [employer].  Fall Prevention in the Home Falls can cause injuries. They can happen to people of all ages. There are many things you can do to make your home safe and to help prevent falls. What can I do on the outside of my home? Regularly fix the edges of walkways and driveways and fix any cracks. Remove anything that might make you trip as you walk through a door, such as a raised step or threshold. Trim any bushes or trees on the path to your home. Use bright outdoor lighting. Clear any walking paths of anything that might make someone trip, such as rocks or tools. Regularly check to see if handrails are loose or broken. Make sure that both sides of any steps have handrails. Any raised decks and porches should have guardrails on the edges. Have any leaves, snow, or ice cleared regularly. Use sand or salt on walking paths during winter. Clean up any spills in your garage right away. This includes oil or grease spills. What can I do in the bathroom? Use night lights. Install grab bars by the toilet and in the tub and shower. Do not use towel bars as grab bars. Use non-skid mats or decals in the tub or shower. If you need to sit down in the shower, use a plastic, non-slip stool. Keep the floor dry. Clean up any water that spills on the floor as soon as it happens. Remove soap buildup in the tub or shower regularly. Attach bath mats securely with double-sided non-slip rug tape. Do not have throw rugs and other things on the floor that can make you trip. What can I do in the bedroom? Use night lights. Make sure that you have a light by your bed that is easy to reach. Do not use any sheets or blankets that are too big for your bed. They should not hang  down onto the floor. Have a firm chair that has side arms. You can use this for support while you get dressed. Do not have throw rugs and other things on the floor that can make you trip. What can I do in the kitchen? Clean up any spills right away. Avoid walking on wet floors. Keep items that you use a lot in easy-to-reach places. If you need to reach something above you, use a strong step stool that has a grab bar. Keep electrical cords out of the way. Do not use floor polish or wax that makes floors slippery. If you must use wax, use non-skid floor wax. Do not have throw rugs and other things on the floor that can make you trip. What can I do with my stairs? Do not leave any items on the stairs. Make sure that there are handrails on both sides of the stairs and use them. Fix handrails that are broken or loose. Make sure that handrails are as long as the stairways. Check any carpeting to make sure that it is firmly attached to the stairs. Fix any carpet that is loose or worn. Avoid having throw rugs at the top or bottom of the stairs. If you do have throw rugs, attach them to the floor with carpet tape. Make sure that you have a light  switch at the top of the stairs and the bottom of the stairs. If you do not have them, ask someone to add them for you. What else can I do to help prevent falls? Wear shoes that: Do not have high heels. Have rubber bottoms. Are comfortable and fit you well. Are closed at the toe. Do not wear sandals. If you use a stepladder: Make sure that it is fully opened. Do not climb a closed stepladder. Make sure that both sides of the stepladder are locked into place. Ask someone to hold it for you, if possible. Clearly mark and make sure that you can see: Any grab bars or handrails. First and last steps. Where the edge of each step is. Use tools that help you move around (mobility aids) if they are needed. These  include: Canes. Walkers. Scooters. Crutches. Turn on the lights when you go into a dark area. Replace any light bulbs as soon as they burn out. Set up your furniture so you have a clear path. Avoid moving your furniture around. If any of your floors are uneven, fix them. If there are any pets around you, be aware of where they are. Review your medicines with your doctor. Some medicines can make you feel dizzy. This can increase your chance of falling. Ask your doctor what other things that you can do to help prevent falls. This information is not intended to replace advice given to you by your health care provider. Make sure you discuss any questions you have with your health care provider. Document Released: 02/05/2009 Document Revised: 09/17/2015 Document Reviewed: 05/16/2014 Elsevier Interactive Patient Education  2017 [employer].

## 2022-10-04 ENCOUNTER — Telehealth: Payer: Self-pay | Admitting: Family Medicine

## 2022-10-04 NOTE — Telephone Encounter (Signed)
[facility] [employer]-. Placed in bin on front desk.

## 2022-10-04 NOTE — Telephone Encounter (Signed)
Placed in Dr Tabori to be reviewed folder 

## 2022-10-07 ENCOUNTER — Ambulatory Visit: Payer: Medicare PPO | Admitting: Vascular Surgery

## 2022-10-07 ENCOUNTER — Ambulatory Visit (HOSPITAL_COMMUNITY)
Admission: RE | Admit: 2022-10-07 | Discharge: 2022-10-07 | Disposition: A | Discharge status: Home / Self Care | Payer: Medicare PPO | Source: Ambulatory Visit | Attending: Vascular Surgery | Admitting: Vascular Surgery

## 2022-10-07 ENCOUNTER — Encounter: Payer: Self-pay | Admitting: Vascular Surgery

## 2022-10-07 VITALS — BP 143/90 | HR 69 | Temp 97.8°F | Resp 20 | Ht 64.0 in | Wt 161.0 lb

## 2022-10-07 DIAGNOSIS — R6889 Other general symptoms and signs: Secondary | ICD-10-CM

## 2022-10-07 DIAGNOSIS — I739 Peripheral vascular disease, unspecified: Secondary | ICD-10-CM | POA: Diagnosis not present

## 2022-10-07 LAB — VAS US ABI WITH/WO TBI
Left ABI: 1.17
Right ABI: 1.19

## 2022-10-07 NOTE — Progress Notes (Signed)
Office Note     CC: Abnormal ABI Requesting Provider:  Sheliah Hatch, MD  HPI: Monica Diaz is a 82 y.o. ([birth year]) female presenting at the request of .Sheliah Hatch, MD after abnormal ABI was obtained on insurance physical.  On exam, Monica Diaz was doing well.  A native of [place] [place], she has lived in several places for her career.  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  She has 2 children, 1 of which lives in [place], and the other who is a professor at the [employer] of [place].  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  Her mother is 14 years old, with some mild to moderate dementia, and currently lives with Monica Diaz.    Monica Diaz notes some calf cramping at night, which is intermittent.  This is relieved with wife's tells, such as a spoonful of vinegar.  She denies symptoms of claudication, ischemic rest pain, tissue loss.  When able, she enjoys traveling.  The pt is not on a statin for cholesterol management.  The pt is not on a daily aspirin.   Other AC:  - The pt is not on medication for hypertension.   The pt is not diabetic.  Tobacco hx:  -  Past Medical History:  Diagnosis Date   Arthritis    Breast cancer (HCC) 2015   Cancer (HCC) 2015   breast   HOH (hard of hearing)    Hx of radiation therapy 07/18/13-08/14/13   right breast total dose 50 Gy   Hyperlipidemia    Osteoporosis    Personal history of radiation therapy 2015   Seasonal allergies    Sleep apnea    uses a cpap   Thyroid disease    Wears glasses     Past Surgical History:  Procedure Laterality Date   ABDOMINAL HYSTERECTOMY     fibroids   BREAST BIOPSY Right 2015   core    BREAST BIOPSY Left 06/18/2018   DENSE FIBROSIS WITH CALCIFICATIONS AND CHRONIC   BREAST BIOPSY Left 06/07/2017   BREAST EXCISIONAL BIOPSY Right 2005   BREAST LUMPECTOMY Right     COLONOSCOPY     LEFT OOPHORECTOMY     PARTIAL MASTECTOMY WITH NEEDLE LOCALIZATION AND AXILLARY SENTINEL LYMPH NODE BX Right 05/28/2013   Procedure: PARTIAL MASTECTOMY WITH NEEDLE LOCALIZATION AND AXILLARY SENTINEL LYMPH NODE BIOPSY;  Surgeon: Ernestene Mention, MD;  Location: [facility];  Service: General;  Laterality: Right;    Social History   Socioeconomic History   Marital status: Divorced    Spouse name: Not on file   Number of children: 2   Years of education: Not on file   Highest education level: Doctorate  Occupational History    Employer: RETIRED  Tobacco Use   Smoking status: Never   Smokeless tobacco: Never  Vaping Use   Vaping Use: Never used  Substance and Sexual Activity   Alcohol use: Yes    Comment: occasionally   Drug use: No   Sexual activity: Not Currently  Other Topics Concern   Not on file  Social History Narrative   Right handed, Caffeine 2-3 cups average daily.  Divorced, 2 daughters.     Monica Diaz (doctorate of education).     Social Determinants of Health   Financial Resource Strain: Low Risk  (09/28/2022)   Overall Financial Resource Strain (CARDIA)    Difficulty of  Paying Living Expenses: Not hard at all  Food Insecurity: No Food Insecurity (09/28/2022)   Hunger Vital Sign    Worried About Running Out of Food in the Last Year: Never true    Ran Out of Food in the Last Year: Never true  Transportation Needs: No Transportation Needs (09/28/2022)   PRAPARE - Administrator, Civil Service (Medical): No    Lack of Transportation (Non-Medical): No  Physical Activity: Inactive (09/28/2022)   Exercise Vital Sign    Days of Exercise per Week: 0 days    Minutes of Exercise per Session: 0 min  Stress: No Stress Concern Present (09/20/2022)   [employer] of Occupational Health - Occupational Stress Questionnaire    Feeling of Stress : Not at all  Social Connections: Moderately Integrated (09/28/2022)   Social Connection and Isolation  Panel [NHANES]    Frequency of Communication with Friends and Family: More than three times a week    Frequency of Social Gatherings with Friends and Family: Once a week    Attends Religious Services: More than 4 times per year    Active Member of [employer] or Organizations: Yes    Attends Engineer, structural: More than 4 times per year    Marital Status: Divorced  Intimate Partner Violence: Not At Risk (09/28/2022)   Humiliation, Afraid, Rape, and Kick questionnaire    Fear of Current or Ex-Partner: No    Emotionally Abused: No    Physically Abused: No    Sexually Abused: No   Family History  Problem Relation Age of Onset   Hypertension Mother    Diabetes Maternal Aunt    Stroke Other        grandfather   Breast cancer Neg Hx     Current Outpatient Medications  Medication Sig Dispense Refill   alendronate (FOSAMAX) 70 MG tablet TAKE 1 TABLET BY MOUTH 1 TIME EVERY WEEK WITH A FULL GLASS OF WATER AND ON AN EMPTY STOMACH 12 tablet 0   ARMOUR THYROID 60 MG tablet TAKE 1 TABLET BY MOUTH DAILY BEFORE BREAKFAST 30 tablet 6   azelastine (ASTELIN) 0.1 % nasal spray Place into both nostrils 2 (two) times daily. Use in each nostril as directed     calcium citrate-vitamin Diaz (CITRACAL+Diaz) 315-200 MG-UNIT per tablet Take 1 tablet by mouth 2 (two) times daily.     Coenzyme Q10 (COQ10) 200 MG CAPS Take 1 capsule by mouth at bedtime.     Multiple Vitamins-Minerals (ZINC PO) Take by mouth.     Propylene Glycol (SYSTANE COMPLETE OP) Apply to eye.     pyridoxine (B-6) 100 MG tablet Take 100 mg by mouth 2 (two) times daily.     Red Yeast Rice Extract ([employer] RED YEAST RICE) 600 MG CAPS Take 2 capsules (1,200 mg total) by mouth 2 (two) times daily.  0   vitamin B-12 (CYANOCOBALAMIN) 50 MCG tablet Take 50 mcg by mouth.     vitamin C (ASCORBIC ACID) 500 MG tablet Take 500 mg by mouth 2 (two) times daily.     No current facility-administered medications for this visit.    Allergies  Allergen  Reactions   Codeine     REACTION: sick to stomach     REVIEW OF SYSTEMS:  [X]  denotes positive finding, [ ]  denotes negative finding Cardiac  Comments:  Chest pain or chest pressure:    Shortness of breath upon exertion:    Short of breath when lying flat:  Irregular heart rhythm:        Vascular    Pain in calf, thigh, or hip brought on by ambulation:    Pain in feet at night that wakes you up from your sleep:     Blood clot in your veins:    Leg swelling:         Pulmonary    Oxygen at home:    Productive cough:     Wheezing:         Neurologic    Sudden weakness in arms or legs:     Sudden numbness in arms or legs:     Sudden onset of difficulty speaking or slurred speech:    Temporary loss of vision in one eye:     Problems with dizziness:         Gastrointestinal    Blood in stool:     Vomited blood:         Genitourinary    Burning when urinating:     Blood in urine:        Psychiatric    Major depression:         Hematologic    Bleeding problems:    Problems with blood clotting too easily:        Skin    Rashes or ulcers:        Constitutional    Fever or chills:      PHYSICAL EXAMINATION:  Vitals:   10/07/22 1343  BP: (!) 143/90  Pulse: 69  Resp: 20  Temp: 97.8 F (36.6 C)  SpO2: 96%  Weight: 161 lb (73 kg)  Height: 5\' 4"  (1.626 m)    General:  WDWN in NAD; vital signs documented above Gait: Not observed HENT: WNL, normocephalic Pulmonary: normal non-labored breathing , without wheezing Cardiac: regular HR Abdomen: soft, NT, no masses Skin: without rashes Vascular Exam/Pulses:  Right Left  Radial 2+ (normal) 2+ (normal)  Ulnar    Femoral    Popliteal    DP 2+ (normal) 2+ (normal)  PT     Extremities: without ischemic changes, without Gangrene , without cellulitis; without open wounds;  Musculoskeletal: no muscle wasting or atrophy  Neurologic: A&O X 3;  No focal weakness or paresthesias are detected Psychiatric:  The pt  has Normal affect.   Non-Invasive Vascular Imaging:    +-------+-----------+-----------+------------+------------+  ABI/TBIToday's ABIToday's TBIPrevious ABIPrevious TBI  +-------+-----------+-----------+------------+------------+  Right 1.19       0.65                                 +-------+-----------+-----------+------------+------------+  Left  1.17       0.82                                 +-------+-----------+-----------+------------+------------+      ASSESSMENT/PLAN: SUANNE DAMBOISE is a 82 y.o. female presenting with abnormal ABI on recent insurance physical.  ABIs obtained today were normal.  On physical exam, Payslie had a palpable pulse in her feet bilaterally.  She has no objective findings, nor symptoms consistent with peripheral arterial disease.  Kahli can follow-up with me as needed.  She was very open about struggles with her mother.  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  I challenged her to continue to live an active, independent lifestyle  is much as possible, and take vantage of traveling when able, as she remains very active.  Asked that she reach out if there is anything that I can do in the future.   Victorino Sparrow, MD [facility] [phone]

## 2022-10-12 ENCOUNTER — Telehealth: Payer: Self-pay | Admitting: Family Medicine

## 2022-10-12 NOTE — Telephone Encounter (Signed)
[facility]- Comp Chart Note Placed in front bin

## 2022-10-12 NOTE — Telephone Encounter (Signed)
Placed in Dr Tabori to be reviewed folder 

## 2022-10-28 ENCOUNTER — Other Ambulatory Visit: Payer: Self-pay | Admitting: Family Medicine

## 2022-10-28 DIAGNOSIS — M8000XG Age-related osteoporosis with current pathological fracture, unspecified site, subsequent encounter for fracture with delayed healing: Secondary | ICD-10-CM

## 2023-01-17 ENCOUNTER — Other Ambulatory Visit: Payer: Self-pay

## 2023-01-17 DIAGNOSIS — E039 Hypothyroidism, unspecified: Secondary | ICD-10-CM

## 2023-01-17 MED ORDER — THYROID 60 MG PO TABS
60.0000 mg | ORAL_TABLET | Freq: Every day | ORAL | 6 refills | Status: DC
Start: 2023-01-17 — End: 2023-05-31

## 2023-02-08 DIAGNOSIS — H43813 Vitreous degeneration, bilateral: Secondary | ICD-10-CM | POA: Diagnosis not present

## 2023-02-08 DIAGNOSIS — H2513 Age-related nuclear cataract, bilateral: Secondary | ICD-10-CM | POA: Diagnosis not present

## 2023-02-08 DIAGNOSIS — H40013 Open angle with borderline findings, low risk, bilateral: Secondary | ICD-10-CM | POA: Diagnosis not present

## 2023-02-08 DIAGNOSIS — H5213 Myopia, bilateral: Secondary | ICD-10-CM | POA: Diagnosis not present

## 2023-02-08 DIAGNOSIS — H52223 Regular astigmatism, bilateral: Secondary | ICD-10-CM | POA: Diagnosis not present

## 2023-02-08 DIAGNOSIS — H53143 Visual discomfort, bilateral: Secondary | ICD-10-CM | POA: Diagnosis not present

## 2023-02-08 DIAGNOSIS — H524 Presbyopia: Secondary | ICD-10-CM | POA: Diagnosis not present

## 2023-02-17 ENCOUNTER — Other Ambulatory Visit: Payer: Self-pay | Admitting: Family Medicine

## 2023-02-17 DIAGNOSIS — M8000XG Age-related osteoporosis with current pathological fracture, unspecified site, subsequent encounter for fracture with delayed healing: Secondary | ICD-10-CM

## 2023-03-30 ENCOUNTER — Ambulatory Visit: Payer: Medicare PPO | Admitting: Family Medicine

## 2023-03-30 ENCOUNTER — Encounter: Payer: Self-pay | Admitting: Family Medicine

## 2023-03-30 VITALS — BP 122/72 | HR 66 | Temp 98.3°F | Ht 64.0 in | Wt 161.4 lb

## 2023-03-30 DIAGNOSIS — H9193 Unspecified hearing loss, bilateral: Secondary | ICD-10-CM | POA: Diagnosis not present

## 2023-03-30 DIAGNOSIS — E559 Vitamin D deficiency, unspecified: Secondary | ICD-10-CM | POA: Diagnosis not present

## 2023-03-30 DIAGNOSIS — Z0000 Encounter for general adult medical examination without abnormal findings: Secondary | ICD-10-CM

## 2023-03-30 DIAGNOSIS — E785 Hyperlipidemia, unspecified: Secondary | ICD-10-CM | POA: Diagnosis not present

## 2023-03-30 LAB — CBC WITH DIFFERENTIAL/PLATELET
Basophils Absolute: 0 10*3/uL (ref 0.0–0.1)
Basophils Relative: 0.2 % (ref 0.0–3.0)
Eosinophils Absolute: 0.1 10*3/uL (ref 0.0–0.7)
Eosinophils Relative: 1.9 % (ref 0.0–5.0)
HCT: 37.7 % (ref 36.0–46.0)
Hemoglobin: 12.9 g/dL (ref 12.0–15.0)
Lymphocytes Relative: 39.3 % (ref 12.0–46.0)
Lymphs Abs: 2.1 10*3/uL (ref 0.7–4.0)
MCHC: 34.2 g/dL (ref 30.0–36.0)
MCV: 93.3 fL (ref 78.0–100.0)
Monocytes Absolute: 0.6 10*3/uL (ref 0.1–1.0)
Monocytes Relative: 10.6 % (ref 3.0–12.0)
Neutro Abs: 2.6 10*3/uL (ref 1.4–7.7)
Neutrophils Relative %: 48 % (ref 43.0–77.0)
Platelets: 284 10*3/uL (ref 150.0–400.0)
RBC: 4.04 Mil/uL (ref 3.87–5.11)
RDW: 15.3 % (ref 11.5–15.5)
WBC: 5.5 10*3/uL (ref 4.0–10.5)

## 2023-03-30 LAB — HEPATIC FUNCTION PANEL
ALT: 12 U/L (ref 0–35)
AST: 19 U/L (ref 0–37)
Albumin: 4 g/dL (ref 3.5–5.2)
Alkaline Phosphatase: 38 U/L — ABNORMAL LOW (ref 39–117)
Bilirubin, Direct: 0.1 mg/dL (ref 0.0–0.3)
Total Bilirubin: 0.5 mg/dL (ref 0.2–1.2)
Total Protein: 7.6 g/dL (ref 6.0–8.3)

## 2023-03-30 LAB — LIPID PANEL
Cholesterol: 184 mg/dL (ref 0–200)
HDL: 64.4 mg/dL (ref >39.00)
LDL Cholesterol: 108 mg/dL — ABNORMAL HIGH (ref 0–99)
NonHDL: 119.96
Total CHOL/HDL Ratio: 3
Triglycerides: 59 mg/dL (ref 0.0–149.0)
VLDL: 11.8 mg/dL (ref 0.0–40.0)

## 2023-03-30 LAB — BASIC METABOLIC PANEL
BUN: 9 mg/dL (ref 6–23)
CO2: 30 meq/L (ref 19–32)
Calcium: 9.5 mg/dL (ref 8.4–10.5)
Chloride: 105 meq/L (ref 96–112)
Creatinine, Ser: 0.91 mg/dL (ref 0.40–1.20)
GFR: 59.44 mL/min — ABNORMAL LOW (ref >60.00)
Glucose, Bld: 87 mg/dL (ref 70–99)
Potassium: 3.9 meq/L (ref 3.5–5.1)
Sodium: 141 meq/L (ref 135–145)

## 2023-03-30 LAB — VITAMIN D 25 HYDROXY (VIT D DEFICIENCY, FRACTURES): VITD: 58.27 ng/mL (ref 30.00–100.00)

## 2023-03-30 LAB — TSH: TSH: 1.6 u[IU]/mL (ref 0.35–5.50)

## 2023-03-30 NOTE — Assessment & Plan Note (Signed)
Pt's PE WNL.  UTD on mammo, Tdap, PNA, flu.  No longer doing colon cancer screening.  Check labs.  Anticipatory guidance provided.

## 2023-03-30 NOTE — Patient Instructions (Signed)
Follow up in 6 months to recheck thyroid We'll notify you of your lab results and make any changes if needed Keep up the good work on healthy diet and regular exercise- you look great! Call with any questions or concerns Stay Safe!  Stay Healthy! Happy Holidays!!!

## 2023-03-30 NOTE — Assessment & Plan Note (Signed)
Chronic problem.  Attempting to control w/ Red Yeast Rice and lifestyle modifications.  Check labs.  Start statin prn.

## 2023-03-30 NOTE — Progress Notes (Signed)
   Subjective:    Patient ID: Monica Diaz, female    DOB: 12-22-1940, 82 y.o.   MRN: [id number]  HPI CPE- UTD on mammo, Tdap, PNA, flu  Patient Care Team    Relationship Specialty Notifications Start End  Sheliah Hatch, MD PCP - General   04/07/10    Comment: Urbano Heir, Porfirio Mylar, MD Consulting Physician Neurology  05/22/13   Charna Elizabeth, MD Consulting Physician Gastroenterology  08/27/15   Magrinat, Valentino Hue, MD (Inactive) Consulting Physician Oncology  09/14/16   Dominica Severin, MD Consulting Physician Orthopedic Surgery  09/14/16     Health Maintenance  Topic Date Due   COVID-19 Vaccine (7 - 2023-24 season) 03/29/2023   MAMMOGRAM  06/22/2023   Medicare Annual Wellness (AWV)  09/28/2023   DTaP/Tdap/Td (2 - Tdap) 03/18/2024   Pneumonia Vaccine 47+ Years old  Completed   INFLUENZA VACCINE  Completed   DEXA SCAN  Completed   Zoster Vaccines- Shingrix  Completed   HPV VACCINES  Aged Out      Review of Systems Patient reports no vision changes, adenopathy,fever, weight change,  persistant/recurrent hoarseness , swallowing issues, chest pain, palpitations, edema, persistant/recurrent cough, hemoptysis, dyspnea (rest/exertional/paroxysmal nocturnal), gastrointestinal bleeding (melena, rectal bleeding), abdominal pain, significant heartburn, bowel changes, GU symptoms (dysuria, hematuria, incontinence), Gyn symptoms (abnormal  bleeding, pain),  syncope, focal weakness, memory loss, numbness & tingling, skin/hair/nail changes, abnormal bruising or bleeding, anxiety, or depression.   + hearing loss- hearing aids were recommended in the past but pt 'wasn't ready'.      Objective:   Physical Exam General Appearance:    Alert, cooperative, no distress, appears stated age  Head:    Normocephalic, without obvious abnormality, atraumatic  Eyes:    PERRL, conjunctiva/corneas clear, EOM's intact both eyes  Ears:    Normal TM's and external ear canals, both ears  Nose:   Nares  normal, septum midline, mucosa normal, no drainage    or sinus tenderness  Throat:   Lips, mucosa, and tongue normal; teeth and gums normal  Neck:   Supple, symmetrical, trachea midline, no adenopathy;    Thyroid: no enlargement/tenderness/nodules  Back:     Symmetric, no curvature, ROM normal, no CVA tenderness  Lungs:     Clear to auscultation bilaterally, respirations unlabored  Chest Wall:    No tenderness or deformity   Heart:    Regular rate and rhythm, S1 and S2 normal, no murmur, rub   or gallop  Breast Exam:    Deferred to mammo  Abdomen:     Soft, non-tender, bowel sounds active all four quadrants,    no masses, no organomegaly  Genitalia:    Deferred   Rectal:    Extremities:   Extremities normal, atraumatic, no cyanosis or edema  Pulses:   2+ and symmetric all extremities  Skin:   Skin color, texture, turgor normal, no rashes or lesions  Lymph nodes:   Cervical, supraclavicular, and axillary nodes normal  Neurologic:   CNII-XII intact, normal strength, sensation and reflexes    throughout          Assessment & Plan:

## 2023-03-30 NOTE — Assessment & Plan Note (Signed)
Check labs and replete prn. 

## 2023-03-31 ENCOUNTER — Telehealth: Payer: Self-pay

## 2023-03-31 NOTE — Telephone Encounter (Signed)
-----   Message from Neena Rhymes sent at 03/31/2023 12:42 PM EST ----- Labs look great!  No changes at this time

## 2023-04-20 ENCOUNTER — Other Ambulatory Visit: Payer: Self-pay | Admitting: Hematology and Oncology

## 2023-04-20 DIAGNOSIS — Z1231 Encounter for screening mammogram for malignant neoplasm of breast: Secondary | ICD-10-CM

## 2023-05-10 ENCOUNTER — Other Ambulatory Visit: Payer: Self-pay | Admitting: Family Medicine

## 2023-05-10 DIAGNOSIS — M8000XG Age-related osteoporosis with current pathological fracture, unspecified site, subsequent encounter for fracture with delayed healing: Secondary | ICD-10-CM

## 2023-05-16 ENCOUNTER — Ambulatory Visit: Payer: Medicare PPO | Attending: Family Medicine | Admitting: Audiologist

## 2023-05-16 ENCOUNTER — Telehealth: Payer: Self-pay | Admitting: Family Medicine

## 2023-05-16 DIAGNOSIS — H903 Sensorineural hearing loss, bilateral: Secondary | ICD-10-CM | POA: Insufficient documentation

## 2023-05-16 NOTE — Telephone Encounter (Addendum)
Type of form received:[employer]  Additional comments: Not covered - Alternative   Received ON:[id number]- Front Desk   Form should be Faxed/mailed to: N/A  Is patient requesting call for pickup:N/A  Form placed: [employer] charge sheet.  Provider will determine charge.N/A  Individual made aware of 3-5 business day turn around No?    Placed in scan bin

## 2023-05-16 NOTE — Telephone Encounter (Signed)
Placed in folder at nurse station

## 2023-05-16 NOTE — Procedures (Signed)
  Outpatient Audiology and [facility] [address] [place], [place]  [address] [phone]  AUDIOLOGICAL  EVALUATION  NAME: Monica Diaz     DOB:   May 19, 1940      MRN: [id number]                                                                                     DATE: 05/16/2023     REFERENT: Sheliah Hatch, MD STATUS: Outpatient DIAGNOSIS: Sensorineural Hearing Loss    History: [name] was seen for an audiological evaluation due to continued difficulty hearing due to  [facility] denies pain, pressure, or tinnitus. She previously has a hearing test with [facility] in 2022. Hearing aids and Otolaryngology were recommended. [name] has a Otolaryngology appointment with [facility] and hearing aid consultation. She decided against pursuing aids. [name] no history of hazardous noise exposure.  Medical history shows no additonal risk for hearing loss.    Evaluation:  Otoscopy showed a clear view of the tympanic membranes, bilaterally Tympanometry results were consistent with normal middle ear function with slight negative pressure in the left ear.  Audiometric testing was completed using Conventional Audiometry techniques with insert earphones and supraural headphones. 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 Speech Recognition Thresholds were obtained at 50dB HL in the right ear and at 35dB HL in the left ear. Word Recognition Testing was completed at  40dB SL and [name] scored 88% in the right ear and 96% in the left ear.    Results:  The test results were reviewed with [name]. She still needs hearing aids for both ears. Hearing loss has not significantly progressed compared to previous testing. Audiogram from today and 2022 printed and provided to [facility]    Recommendations: Hearing aids recommended for both ears. Patient given list of local hearing aid  providers. Previously cleared for aids in 2022 by Otolaryngology.  Annual audiometric testing recommended to monitor hearing loss for progression.   32 minutes spent testing and counseling on results.   If you have any questions please feel free to contact me at [phone].  Ammie Ferrier Au.D.  Audiologist   05/16/2023  1:44 PM  Cc: Sheliah Hatch, MD

## 2023-05-31 ENCOUNTER — Other Ambulatory Visit: Payer: Self-pay

## 2023-05-31 MED ORDER — LEVOTHYROXINE SODIUM 100 MCG PO TABS: 100.0000 ug | ORAL_TABLET | Freq: Every day | ORAL | 4 refills | Status: DC

## 2023-05-31 NOTE — Progress Notes (Signed)
 Patient came by the office with a letter from [facility] stating they were no longer covering her Armour Thyroid . I told patient the two option she had was for Dr. Mahlon to send in a new medication or I could attempt a prior auth to see if they would cover it. I attempted it and they would not without her trying and failing two other medications. I discussed with Dr. Mahlon and she instructed me to send in 100mcg Levothyroxine . I called patient to let her know and confirmed the pharmacy. I removed armour thyroid  from her medication list.

## 2023-06-26 ENCOUNTER — Ambulatory Visit
Admission: RE | Admit: 2023-06-26 | Discharge: 2023-06-26 | Disposition: A | Discharge status: Home / Self Care | Payer: Medicare PPO | Source: Ambulatory Visit | Attending: Hematology and Oncology

## 2023-06-26 DIAGNOSIS — Z1231 Encounter for screening mammogram for malignant neoplasm of breast: Secondary | ICD-10-CM

## 2023-07-18 DIAGNOSIS — H25013 Cortical age-related cataract, bilateral: Secondary | ICD-10-CM | POA: Diagnosis not present

## 2023-07-18 DIAGNOSIS — H2512 Age-related nuclear cataract, left eye: Secondary | ICD-10-CM | POA: Diagnosis not present

## 2023-07-18 DIAGNOSIS — H25043 Posterior subcapsular polar age-related cataract, bilateral: Secondary | ICD-10-CM | POA: Diagnosis not present

## 2023-07-18 DIAGNOSIS — H18413 Arcus senilis, bilateral: Secondary | ICD-10-CM | POA: Diagnosis not present

## 2023-07-18 DIAGNOSIS — H2513 Age-related nuclear cataract, bilateral: Secondary | ICD-10-CM | POA: Diagnosis not present

## 2023-07-20 ENCOUNTER — Other Ambulatory Visit: Payer: Self-pay

## 2023-07-20 ENCOUNTER — Inpatient Hospital Stay: Payer: Medicare PPO | Attending: Hematology and Oncology | Admitting: Hematology and Oncology

## 2023-07-20 VITALS — BP 111/75 | HR 70 | Temp 98.1°F | Resp 17 | Ht 64.0 in | Wt 164.6 lb

## 2023-07-20 DIAGNOSIS — Z923 Personal history of irradiation: Secondary | ICD-10-CM | POA: Diagnosis not present

## 2023-07-20 DIAGNOSIS — Z853 Personal history of malignant neoplasm of breast: Secondary | ICD-10-CM | POA: Insufficient documentation

## 2023-07-20 DIAGNOSIS — Z08 Encounter for follow-up examination after completed treatment for malignant neoplasm: Secondary | ICD-10-CM | POA: Insufficient documentation

## 2023-07-20 DIAGNOSIS — Z170 Estrogen receptor positive status [ER+]: Secondary | ICD-10-CM

## 2023-07-20 DIAGNOSIS — C50411 Malignant neoplasm of upper-outer quadrant of right female breast: Secondary | ICD-10-CM | POA: Diagnosis not present

## 2023-07-20 DIAGNOSIS — H269 Unspecified cataract: Secondary | ICD-10-CM | POA: Insufficient documentation

## 2023-07-20 NOTE — Assessment & Plan Note (Signed)
 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  Breast exam 07/20/2023: Benign 2. mammogram 06/29/2023: Benign breast density category B   Return to clinic on an as-needed basis

## 2023-07-20 NOTE — Progress Notes (Signed)
 Patient Care Team: Sheliah Hatch, MD as PCP - General Dohmeier, Porfirio Mylar, MD as Consulting Physician (Neurology) Charna Elizabeth, MD as Consulting Physician (Gastroenterology) Magrinat, Valentino Hue, MD (Inactive) as Consulting Physician (Oncology) Dominica Severin, MD as Consulting Physician (Orthopedic Surgery)  DIAGNOSIS:  Encounter Diagnosis  Name Primary?   Malignant neoplasm of upper-outer quadrant of right breast in female, estrogen receptor positive (HCC) Yes    CHIEF COMPLIANT: Surveillance of breast cancer  HISTORY OF PRESENT ILLNESS:  History of Present Illness The patient, a 10-year breast cancer survivor, presents for a routine follow-up. She reports occasional muscle spasms in the breast area, but no regular aches or pains. She recently had a mammogram, which showed no abnormalities. The patient's breast tissue is less dense, which is beneficial for mammogram accuracy. She was previously on tamoxifen, which may have contributed to the reduced density.  The patient is also preparing for cataract surgery in both eyes, scheduled for June and July. She has been using Refresh Digital PF four times a day for two weeks prior to her visit with the surgeon. She plans to continue using it, but not as frequently. She also has three other medications to take three days prior to the surgery.  The patient takes vitamin B12 intermittently and has two remaining prescription medications. All other medications are over-the-counter.     ALLERGIES:  is allergic to codeine.  MEDICATIONS:  Current Outpatient Medications  Medication Sig Dispense Refill   alendronate (FOSAMAX) 70 MG tablet TAKE 1 TABLET BY MOUTH 1 TIME EVERY WEEK WITH A FULL GLASS OF WATER AND ON AN EMPTY STOMACH 12 tablet 0   calcium citrate-vitamin D (CITRACAL+D) 315-200 MG-UNIT per tablet Take 1 tablet by mouth 2 (two) times daily.     Coenzyme Q10 (COQ10) 200 MG CAPS Take 1 capsule by mouth at bedtime.     levothyroxine  (SYNTHROID) 100 MCG tablet Take 1 tablet (100 mcg total) by mouth daily. 30 tablet 4   Multiple Vitamins-Minerals (ZINC PO) Take by mouth.     pyridoxine (B-6) 100 MG tablet Take 100 mg by mouth 2 (two) times daily.     Red Yeast Rice Extract ([employer] RED YEAST RICE) 600 MG CAPS Take 2 capsules (1,200 mg total) by mouth 2 (two) times daily.  0   vitamin B-12 (CYANOCOBALAMIN) 50 MCG tablet Take 50 mcg by mouth.     vitamin C (ASCORBIC ACID) 500 MG tablet Take 500 mg by mouth 2 (two) times daily.     No current facility-administered medications for this visit.    PHYSICAL EXAMINATION: ECOG PERFORMANCE STATUS: 1 - Symptomatic but completely ambulatory  Vitals:   07/20/23 1352  BP: 111/75  Pulse: 70  Resp: 17  Temp: 98.1 F (36.7 C)  SpO2: 99%   Filed Weights   07/20/23 1352  Weight: 164 lb 9.6 oz (74.7 kg)      LABORATORY DATA:  I have reviewed the data as listed    Latest Ref Rng & Units 03/30/2023   10:43 AM 09/22/2022   10:45 AM 08/04/2022   12:50 PM  CMP  Glucose 70 - 99 mg/dL 87  76  86   BUN 6 - 23 mg/dL 9  10  12    Creatinine 0.40 - 1.20 mg/dL 6.43  3.29  5.18   Sodium 135 - 145 mEq/L 141  140  139   Potassium 3.5 - 5.1 mEq/L 3.9  4.1  3.9   Chloride 96 - 112 mEq/L 105  105  107   CO2 19 - 32 mEq/L 30  28  27    Calcium 8.4 - 10.5 mg/dL 9.5  9.3  9.5   Total Protein 6.0 - 8.3 g/dL 7.6  7.7  7.2   Total Bilirubin 0.2 - 1.2 mg/dL 0.5  0.4  0.4   Alkaline Phos 39 - 117 U/L 38  37  36   AST 0 - 37 U/L 19  16  15    ALT 0 - 35 U/L 12  12  11      Lab Results  Component Value Date   WBC 5.5 03/30/2023   HGB 12.9 03/30/2023   HCT 37.7 03/30/2023   MCV 93.3 03/30/2023   PLT 284.0 03/30/2023   NEUTROABS 2.6 03/30/2023    ASSESSMENT & PLAN:  Malignant neoplasm of upper-outer quadrant of right breast in female, estrogen receptor positive (HCC) 05/14/2013: T2N0 stage IIa grade 2-3 IDC ER 99%, PR 50% HER2 negative, Ki-67 91% 05/20/2013: Right sternal lesion 6 mm, negative  on PET scan 07/12/2013: Right lumpectomy: Stage IIa grade 2 IDC with mucinous features ER/PR positive HER2 negative left axillary lymph nodes: Benign 07/12/2013: Oncotype DX score 18 (risk of distant recurrence 12%) 08/14/2013: Adjuvant radiation completed 09/06/2013: Anastrozole started discontinued 09/12/2018   Breast cancer surveillance: mammogram 06/29/2023: Benign breast density category B   Return to clinic on an as-needed basis ------------------------------------- Assessment and Plan Assessment & Plan Malignant neoplasm of upper-outer quadrant of right breast, estrogen receptor positive 10 years post-diagnosis with no significant symptoms. Mammograms normal, decreased breast tissue density. Completed treatment including tamoxifen. - Continue annual mammograms. - Graduate from the oncology clinic.  Cataracts Scheduled for cataract surgery on June 6th (left eye) and July 9th (right eye). Using Refresh Digital PF as per optometrist's advice. - Continue using Refresh Digital PF four times a day until the visit with the surgeon. - Proceed with cataract surgery as scheduled.      No orders of the defined types were placed in this encounter.  The patient has a good understanding of the overall plan. she agrees with it. she will call with any problems that may develop before the next visit here. Total time spent: 30 mins including face to face time and time spent for planning, charting and co-ordination of care   Tamsen Meek, MD 07/20/23

## 2023-08-02 ENCOUNTER — Other Ambulatory Visit: Payer: Self-pay | Admitting: Family Medicine

## 2023-08-02 DIAGNOSIS — M8000XG Age-related osteoporosis with current pathological fracture, unspecified site, subsequent encounter for fracture with delayed healing: Secondary | ICD-10-CM

## 2023-10-04 DIAGNOSIS — H2512 Age-related nuclear cataract, left eye: Secondary | ICD-10-CM | POA: Diagnosis not present

## 2023-10-04 HISTORY — PX: OTHER SURGICAL HISTORY: SHX169

## 2023-10-05 DIAGNOSIS — H2511 Age-related nuclear cataract, right eye: Secondary | ICD-10-CM | POA: Diagnosis not present

## 2023-10-05 DIAGNOSIS — H25011 Cortical age-related cataract, right eye: Secondary | ICD-10-CM | POA: Diagnosis not present

## 2023-10-05 DIAGNOSIS — H25041 Posterior subcapsular polar age-related cataract, right eye: Secondary | ICD-10-CM | POA: Diagnosis not present

## 2023-10-23 ENCOUNTER — Other Ambulatory Visit: Payer: Self-pay | Admitting: Family Medicine

## 2023-10-23 DIAGNOSIS — M8000XG Age-related osteoporosis with current pathological fracture, unspecified site, subsequent encounter for fracture with delayed healing: Secondary | ICD-10-CM

## 2023-10-23 NOTE — Telephone Encounter (Signed)
 Please make an appt, was due 09/28/23 for follow up

## 2023-10-23 NOTE — Telephone Encounter (Signed)
Patient has been scheduled for 7/8.

## 2023-10-23 NOTE — Telephone Encounter (Signed)
Error, see previous note

## 2023-10-31 ENCOUNTER — Encounter: Payer: Self-pay | Admitting: Family Medicine

## 2023-10-31 ENCOUNTER — Ambulatory Visit: Admitting: Family Medicine

## 2023-10-31 VITALS — BP 122/74 | HR 63 | Temp 98.3°F | Ht 64.0 in | Wt 165.4 lb

## 2023-10-31 DIAGNOSIS — E663 Overweight: Secondary | ICD-10-CM | POA: Diagnosis not present

## 2023-10-31 DIAGNOSIS — E039 Hypothyroidism, unspecified: Secondary | ICD-10-CM

## 2023-10-31 DIAGNOSIS — E785 Hyperlipidemia, unspecified: Secondary | ICD-10-CM

## 2023-10-31 LAB — HEPATIC FUNCTION PANEL
ALT: 9 U/L (ref 0–35)
AST: 18 U/L (ref 0–37)
Albumin: 4 g/dL (ref 3.5–5.2)
Alkaline Phosphatase: 33 U/L — ABNORMAL LOW (ref 39–117)
Bilirubin, Direct: 0.1 mg/dL (ref 0.0–0.3)
Total Bilirubin: 0.5 mg/dL (ref 0.2–1.2)
Total Protein: 7.3 g/dL (ref 6.0–8.3)

## 2023-10-31 LAB — BASIC METABOLIC PANEL WITH GFR
BUN: 12 mg/dL (ref 6–23)
CO2: 29 meq/L (ref 19–32)
Calcium: 9.2 mg/dL (ref 8.4–10.5)
Chloride: 106 meq/L (ref 96–112)
Creatinine, Ser: 0.94 mg/dL (ref 0.40–1.20)
GFR: 56.94 mL/min — ABNORMAL LOW (ref >60.00)
Glucose, Bld: 87 mg/dL (ref 70–99)
Potassium: 4.1 meq/L (ref 3.5–5.1)
Sodium: 140 meq/L (ref 135–145)

## 2023-10-31 LAB — CBC WITH DIFFERENTIAL/PLATELET
Basophils Absolute: 0 K/uL (ref 0.0–0.1)
Basophils Relative: 0.1 % (ref 0.0–3.0)
Eosinophils Absolute: 0.1 K/uL (ref 0.0–0.7)
Eosinophils Relative: 2.5 % (ref 0.0–5.0)
HCT: 35.4 % — ABNORMAL LOW (ref 36.0–46.0)
Hemoglobin: 11.5 g/dL — ABNORMAL LOW (ref 12.0–15.0)
Lymphocytes Relative: 38.8 % (ref 12.0–46.0)
Lymphs Abs: 1.7 K/uL (ref 0.7–4.0)
MCHC: 32.6 g/dL (ref 30.0–36.0)
MCV: 91.2 fl (ref 78.0–100.0)
Monocytes Absolute: 0.5 K/uL (ref 0.1–1.0)
Monocytes Relative: 12.5 % — ABNORMAL HIGH (ref 3.0–12.0)
Neutro Abs: 2 K/uL (ref 1.4–7.7)
Neutrophils Relative %: 46.1 % (ref 43.0–77.0)
Platelets: 236 K/uL (ref 150.0–400.0)
RBC: 3.88 Mil/uL (ref 3.87–5.11)
RDW: 15.2 % (ref 11.5–15.5)
WBC: 4.3 K/uL (ref 4.0–10.5)

## 2023-10-31 LAB — LIPID PANEL
Cholesterol: 182 mg/dL (ref 0–200)
HDL: 70.7 mg/dL (ref >39.00)
LDL Cholesterol: 101 mg/dL — ABNORMAL HIGH (ref 0–99)
NonHDL: 111.27
Total CHOL/HDL Ratio: 3
Triglycerides: 53 mg/dL (ref 0.0–149.0)
VLDL: 10.6 mg/dL (ref 0.0–40.0)

## 2023-10-31 LAB — TSH: TSH: 0.16 u[IU]/mL — ABNORMAL LOW (ref 0.35–5.50)

## 2023-10-31 NOTE — Progress Notes (Signed)
   Subjective:    Patient ID: Monica Diaz, female    DOB: 08/16/1940, 83 y.o.   MRN: [id number]  HPI Hypothyroid- chronic problem, on Levothyroxine  100mcg daily.  Denies changes to skin/hair/nails.  No change in energy level.  Hyperlipidemia- chronic problem, on Red Yeast Rice 1200mg  BID.  Denies abd pain.  + irregularity.  Has increased water intake.    Overweight- ongoing issue.  Pt has gained 4 lbs since CPE in December.  BMI now 28.39   Review of Systems For ROS see HPI     Objective:   Physical Exam Vitals reviewed.  Constitutional:      General: She is not in acute distress.    Appearance: Normal appearance. She is well-developed. She is not ill-appearing.  HENT:     Head: Normocephalic and atraumatic.  Eyes:     Conjunctiva/sclera: Conjunctivae normal.     Pupils: Pupils are equal, round, and reactive to light.  Neck:     Thyroid : No thyromegaly.  Cardiovascular:     Rate and Rhythm: Normal rate and regular rhythm.     Pulses: Normal pulses.     Heart sounds: Normal heart sounds. No murmur heard. Pulmonary:     Effort: Pulmonary effort is normal. No respiratory distress.     Breath sounds: Normal breath sounds.  Abdominal:     General: There is no distension.     Palpations: Abdomen is soft.     Tenderness: There is no abdominal tenderness.  Musculoskeletal:     Cervical back: Normal range of motion and neck supple.     Right lower leg: No edema.     Left lower leg: No edema.  Lymphadenopathy:     Cervical: No cervical adenopathy.  Skin:    General: Skin is warm and dry.  Neurological:     General: No focal deficit present.     Mental Status: She is alert and oriented to person, place, and time.  Psychiatric:        Mood and Affect: Mood normal.        Behavior: Behavior normal.        Thought Content: Thought content normal.           Assessment & Plan:

## 2023-10-31 NOTE — Patient Instructions (Signed)
Schedule your complete physical in 6 months We'll notify you of your lab results and make any changes if needed Keep up the good work on healthy diet and regular exercise- you look great!!! Call with any questions or concerns Stay Safe!  Stay Healthy! Have a great summer!!! 

## 2023-11-01 ENCOUNTER — Ambulatory Visit: Payer: Self-pay | Admitting: Family Medicine

## 2023-11-01 ENCOUNTER — Other Ambulatory Visit: Payer: Self-pay

## 2023-11-01 ENCOUNTER — Other Ambulatory Visit (INDEPENDENT_AMBULATORY_CARE_PROVIDER_SITE_OTHER)

## 2023-11-01 DIAGNOSIS — E039 Hypothyroidism, unspecified: Secondary | ICD-10-CM

## 2023-11-01 DIAGNOSIS — H2511 Age-related nuclear cataract, right eye: Secondary | ICD-10-CM | POA: Diagnosis not present

## 2023-11-01 LAB — T3, FREE: T3, Free: 2.6 pg/mL (ref 2.3–4.2)

## 2023-11-01 LAB — T4, FREE: Free T4: 1.38 ng/dL (ref 0.60–1.60)

## 2023-11-01 NOTE — Progress Notes (Signed)
 Pt has reviewed labs via MyChart

## 2023-11-01 NOTE — Progress Notes (Signed)
 LVM to call office Faxed lab add on

## 2023-11-07 NOTE — Assessment & Plan Note (Signed)
 Deteriorated.  She has gained 4 lbs since CPE.  BMI 28.39.  encouraged healthy diet and regular physical activity.  Will continue to follow.

## 2023-11-07 NOTE — Assessment & Plan Note (Signed)
 Chronic problem.  On Red Yeast Rice 1200mg  BID w/o difficulty.  Some constipation w/ the supplement but she has increased her water intake.  Check labs.  Adjust meds prn

## 2023-11-07 NOTE — Assessment & Plan Note (Signed)
Chronic problem.  On Levothyroxine daily and currently asymptomatic.  Check labs.  Adjust meds prn

## 2023-11-24 ENCOUNTER — Other Ambulatory Visit: Payer: Self-pay | Admitting: Family Medicine

## 2023-12-05 ENCOUNTER — Other Ambulatory Visit (INDEPENDENT_AMBULATORY_CARE_PROVIDER_SITE_OTHER)

## 2023-12-05 DIAGNOSIS — E039 Hypothyroidism, unspecified: Secondary | ICD-10-CM | POA: Diagnosis not present

## 2023-12-05 LAB — TSH: TSH: 0.23 u[IU]/mL — ABNORMAL LOW (ref 0.35–5.50)

## 2023-12-06 ENCOUNTER — Ambulatory Visit: Payer: Self-pay | Admitting: Family Medicine

## 2023-12-06 DIAGNOSIS — J383 Other diseases of vocal cords: Secondary | ICD-10-CM

## 2023-12-06 LAB — T3: T3, Total: 91 ng/dL (ref 76–181)

## 2023-12-06 LAB — T4: T4, Total: 9.8 ug/dL (ref 5.1–11.9)

## 2023-12-06 NOTE — Telephone Encounter (Signed)
 Copied from CRM #[id number]. Topic: Clinical - Lab/Test Results >> Dec 06, 2023  3:04 PM Burnard DEL wrote: Reason for CRM: Patient returned call to [facility] regarding lab results. Message was relayed to patient,and she verbalized understanding.

## 2023-12-06 NOTE — Progress Notes (Signed)
 LVM to call office.

## 2023-12-12 ENCOUNTER — Encounter: Payer: Self-pay | Admitting: Student in an Organized Health Care Education/Training Program

## 2023-12-12 ENCOUNTER — Ambulatory Visit: Payer: Self-pay

## 2023-12-12 ENCOUNTER — Ambulatory Visit: Admitting: Student in an Organized Health Care Education/Training Program

## 2023-12-12 VITALS — BP 145/85 | HR 67 | Wt 162.0 lb

## 2023-12-12 DIAGNOSIS — J383 Other diseases of vocal cords: Secondary | ICD-10-CM | POA: Insufficient documentation

## 2023-12-12 DIAGNOSIS — J309 Allergic rhinitis, unspecified: Secondary | ICD-10-CM

## 2023-12-12 DIAGNOSIS — K219 Gastro-esophageal reflux disease without esophagitis: Secondary | ICD-10-CM | POA: Insufficient documentation

## 2023-12-12 MED ORDER — ESOMEPRAZOLE MAGNESIUM 40 MG PO CPDR: 40.0000 mg | DELAYED_RELEASE_CAPSULE | Freq: Every day | ORAL | 3 refills | Status: DC

## 2023-12-12 MED ORDER — FLUTICASONE PROPIONATE 50 MCG/ACT NA SUSP: 2.0000 | Freq: Every day | NASAL | 6 refills | Status: DC

## 2023-12-12 NOTE — Assessment & Plan Note (Signed)
 Intermittent dyspnea is likely due to vocal cord dysfunction, possibly exacerbated by postnasal drip, reflux, or stress. Structural abnormalities are less likely given reassuring exam. Prescribed Flonase  for sinus management and Nexium  for potential reflux management. Refered to speech therapy for vocal cord maneuvers.

## 2023-12-12 NOTE — Telephone Encounter (Signed)
 This RN was contacted by CAL to contact patient for triaging since she has an appt on 8/20 for breathing issues and has not had breathing issues previously. 1st attempt no answer, LVM.

## 2023-12-12 NOTE — Telephone Encounter (Signed)
 FYI Only or Action Required?: FYI only for provider.  Patient was last seen in primary care on 10/31/2023 by Mahlon Comer BRAVO, MD.  Called Nurse Triage reporting Breathing Problem.  Symptoms began several months ago.  Interventions attempted: Nothing.  Symptoms are: gradually worsening.  Triage Disposition: See HCP Within 4 Hours (Or PCP Triage)  Patient/caregiver understands and will follow disposition?: yes - pt will call EMS if another occurrence happens before appt.          Reason for Disposition  [1] MILD difficulty breathing (e.g., minimal/no SOB at rest, SOB with walking, pulse < 100) AND [2] NEW-onset or WORSE than normal  Answer Assessment - Initial Assessment Questions Pt describes 3 instances of being unable to continue to breath in. She starts to take a breath and then is unable to  - sort of like being strangled. This happened 1 time  few years back while sucking on a candy. This has happened 3 times in the past few months. One time it resolved with someone performing the Heimlich, which helped her to start breathing and another time she fell while seeking help and the jolt brought her breathing back. Pt describes this as very scary.    Additionally pt has experienced Feeling a bit swimmy headed with exertion.   1. RESPIRATORY STATUS: Describe your breathing? (e.g., wheezing, shortness of breath, unable to speak, severe coughing)      Feels like she can't breath in all the way 2. ONSET: When did this breathing problem begin?      A few months ago 3. PATTERN Does the difficult breathing come and go, or has it been constant since it started?      Comes and goes - started a year or 2 ago. Only happened 1 time then. Since then the it has happened 3 times 4. SEVERITY: How bad is your breathing? (e.g., mild, moderate, severe)      Ok -  5. RECURRENT SYMPTOM: Have you had difficulty breathing before? If Yes, ask: When was the last time? and What happened  that time?      yes 6. CARDIAC HISTORY: Do you have any history of heart disease? (e.g., heart attack, angina, bypass surgery, angioplasty)      no 7. LUNG HISTORY: Do you have any history of lung disease?  (e.g., pulmonary embolus, asthma, emphysema)     no 8. CAUSE: What do you think is causing the breathing problem?      unsure 9. OTHER SYMPTOMS: Do you have any other symptoms? (e.g., chest pain, cough, dizziness, fever, runny nose)     Head feels a little funny at times with exercise  Protocols used: Breathing Difficulty-A-AH

## 2023-12-12 NOTE — Assessment & Plan Note (Signed)
 Sinus congestion and postnasal drip may contribute to vocal cord dysfunction. Symptoms improved with Allegra. Prescribed Flonase  for sinus management.

## 2023-12-12 NOTE — Progress Notes (Signed)
 Acute Office Visit  Subjective:     Patient ID: Monica Diaz, female    DOB: [birth year], 83 y.o.   MRN: [id number]  Chief Complaint  Patient presents with   Breast Problem    Happens infrequently and when it does happen it frightens the patient a great deal.  There is nothing that the patient can state causes it. Within the last 2 months the shortness of breath and having a hard time breathing. States she was having a hard time of exhaling.      HPI  Discussed the use of AI scribe software for clinical note transcription with the patient, who gave verbal consent to proceed.  History of Present Illness Monica Diaz is an 83 year old female who presents with episodes of breathing difficulty.  She has been experiencing episodes of breathing difficulty over the past two to three months, with three occurrences since June. These episodes involve an inability to exhale after inhaling, causing significant distress. She has occurred once in June, once in July, and once in early August. During these episodes, she feels as though she is 'about to go under' and requires assistance to alleviate the problem. The episodes last only a few seconds but are frightening. She recalls a similar incident three to four years ago when she choked on a lifesaver candy, but notes that the recent episodes occurred without anything in her mouth, except once when she had a ball of candy.  She has a history of sinus congestion and postnasal drip. She has been using Allegra, which has helped clear her head congestion. She also experiences a sensation of something in her throat, which has improved with Allegra. She has not been using Flonase  recently, although it was prescribed in the past.  No changes in voice or difficulty swallowing. She does not smoke and has no history of heartburn, although she occasionally takes over-the-counter medication for acid reflux. She experiences dizziness occasionally during  exercise or when walking swiftly upstairs, but it resolves quickly.  She mentions that emotional stress might be a contributing factor, particularly when around her uncle, who is visiting and is described as 'negative'. She has not had her house ducts cleaned recently and wonders if dust could be a factor.      Objective:    BP (!) 145/85   Pulse 67   Wt 162 lb (73.5 kg)   SpO2 100%   BMI 27.81 kg/m    Physical Exam  Gen: Well-appearing woman Mouth: Normal oropharynx Neck: Normal thyroid , no adenopathy, no nodules Lungs: Unlabored, clear with no wheezing, no crackles, no anterior stridor      Assessment & Plan:    Problem List Items Addressed This Visit       Unprioritized   Allergic rhinitis   Sinus congestion and postnasal drip may contribute to vocal cord dysfunction. Symptoms improved with Allegra. Prescribed Flonase  for sinus management.      Relevant Medications   fluticasone  (FLONASE ) 50 MCG/ACT nasal spray   Vocal cord dysfunction - Primary   Intermittent dyspnea is likely due to vocal cord dysfunction, possibly exacerbated by postnasal drip, reflux, or stress. Structural abnormalities are less likely given reassuring exam. Prescribed Flonase  for sinus management and Nexium  for potential reflux management. Refered to speech therapy for vocal cord maneuvers.      Relevant Orders   Ambulatory referral to Speech Therapy   GERD (gastroesophageal reflux disease)   Possible GERD may contribute to vocal cord dysfunction, though  no typical heartburn symptoms are reported. Prescribe Nexium  for potential reflux management.      Relevant Medications   esomeprazole  (NEXIUM ) 40 MG capsule    Meds ordered this encounter  Medications   fluticasone  (FLONASE ) 50 MCG/ACT nasal spray    Sig: Place 2 sprays into both nostrils daily.    Dispense:  16 g    Refill:  6   esomeprazole  (NEXIUM ) 40 MG capsule    Sig: Take 1 capsule (40 mg total) by mouth daily.    Dispense:   30 capsule    Refill:  3    Return if symptoms worsen or fail to improve.  Cleatus Debby Specking, MD

## 2023-12-12 NOTE — Assessment & Plan Note (Signed)
 Possible GERD may contribute to vocal cord dysfunction, though no typical heartburn symptoms are reported. Prescribe Nexium  for potential reflux management.

## 2023-12-12 NOTE — Patient Instructions (Signed)
  VISIT SUMMARY: Today, you were seen for episodes of breathing difficulty that have been occurring over the past two to three months. We discussed your symptoms, including the sensation of not being able to exhale, and reviewed your history of sinus congestion, postnasal drip, and occasional acid reflux. We also considered the potential impact of emotional stress and environmental factors.  YOUR PLAN: -VOCAL CORD DYSFUNCTION: Vocal cord dysfunction is when your vocal cords do not open correctly, which can cause breathing difficulties. We believe this may be worsened by postnasal drip, reflux, or stress. You will start using Flonase  for your sinuses and Nexium  for potential reflux. We are also referring you to speech therapy to learn techniques to manage your vocal cords.  -ALLERGIC RHINITIS WITH POSTNASAL DRIP: Allergic rhinitis with postnasal drip is when allergies cause your nose to be congested and mucus to drip down your throat. This can contribute to your breathing issues. You will continue using Allegra and start using Flonase  to help manage your sinus congestion.  -GASTROESOPHAGEAL REFLUX DISEASE (GERD): GERD is when stomach acid flows back into your esophagus, which can cause irritation and may contribute to your vocal cord dysfunction. Even though you do not have typical heartburn symptoms, you will start taking Nexium  to manage potential reflux.  INSTRUCTIONS: Please follow up with the speech therapist as referred. Continue using Allegra and start using Flonase  and Nexium  as prescribed. If your symptoms do not improve or if you experience any new symptoms, please contact our office.

## 2023-12-13 ENCOUNTER — Ambulatory Visit: Admitting: Family Medicine

## 2023-12-18 NOTE — Addendum Note (Signed)
 Addended by: JERRELL SOLIAN T on: 12/18/2023 03:55 PM   Modules accepted: Orders

## 2024-01-02 ENCOUNTER — Ambulatory Visit (INDEPENDENT_AMBULATORY_CARE_PROVIDER_SITE_OTHER): Admitting: Otolaryngology

## 2024-01-02 ENCOUNTER — Encounter (INDEPENDENT_AMBULATORY_CARE_PROVIDER_SITE_OTHER): Payer: Self-pay | Admitting: Otolaryngology

## 2024-01-02 VITALS — BP 132/74 | HR 75

## 2024-01-02 DIAGNOSIS — R0981 Nasal congestion: Secondary | ICD-10-CM

## 2024-01-02 DIAGNOSIS — J3089 Other allergic rhinitis: Secondary | ICD-10-CM

## 2024-01-02 DIAGNOSIS — R0982 Postnasal drip: Secondary | ICD-10-CM | POA: Diagnosis not present

## 2024-01-02 DIAGNOSIS — K219 Gastro-esophageal reflux disease without esophagitis: Secondary | ICD-10-CM | POA: Diagnosis not present

## 2024-01-02 DIAGNOSIS — R0600 Dyspnea, unspecified: Secondary | ICD-10-CM

## 2024-01-02 DIAGNOSIS — R490 Dysphonia: Secondary | ICD-10-CM

## 2024-01-02 MED ORDER — FLUTICASONE PROPIONATE 50 MCG/ACT NA SUSP: 2.0000 | Freq: Two times a day (BID) | NASAL | 6 refills | Status: AC

## 2024-01-02 MED ORDER — LEVOCETIRIZINE DIHYDROCHLORIDE 5 MG PO TABS: 5.0000 mg | ORAL_TABLET | Freq: Every evening | ORAL | 3 refills | Status: DC

## 2024-01-02 NOTE — Progress Notes (Signed)
 ENT CONSULT:  Reason for Consult: vocal cord dysfunction    HPI: Discussed the use of AI scribe software for clinical note transcription with the patient, who gave verbal consent to proceed.  History of Present Illness Monica Diaz is an 83 year old female who presents with episodes of shortness of breath. She was referred by her primary care doctor for evaluation of her breathing difficulties.  She has experienced episodes of shortness of breath over the past four years, initially triggered by having a piece of candy in her mouth. Subsequent episodes occurred without any oral obstruction. During these episodes, she experiences difficulty inhaling and is unable to exhale, requiring assistance from others to recover. The episodes are sporadic, with the most recent ones occurring between June and early August of this year. She describes the sensation as an inability to exhale after inhaling, without any associated choking.  She has been prescribed medication for reflux and a nasal spray, which she has not yet started using. No choking occurs during the episodes of shortness of breath.     Records Reviewed:  Office visit with Dr Cleatus PCP 12/12/23 Monica Diaz is an 83 year old female who presents with episodes of breathing difficulty.   She has been experiencing episodes of breathing difficulty over the past two to three months, with three occurrences since June. These episodes involve an inability to exhale after inhaling, causing significant distress. She has occurred once in June, once in July, and once in early August. During these episodes, she feels as though she is 'about to go under' and requires assistance to alleviate the problem. The episodes last only a few seconds but are frightening. She recalls a similar incident three to four years ago when she choked on a lifesaver candy, but notes that the recent episodes occurred without anything in her mouth, except once when she  had a ball of candy.   She has a history of sinus congestion and postnasal drip. She has been using Allegra, which has helped clear her head congestion. She also experiences a sensation of something in her throat, which has improved with Allegra. She has not been using Flonase  recently, although it was prescribed in the past.   No changes in voice or difficulty swallowing. She does not smoke and has no history of heartburn, although she occasionally takes over-the-counter medication for acid reflux. She experiences dizziness occasionally during exercise or when walking swiftly upstairs, but it resolves quickly.   She mentions that emotional stress might be a contributing factor, particularly when around her uncle, who is visiting and is described as 'negative'. She has not had her house ducts cleaned recently and wonders if dust could be a factor.   Past Medical History:  Diagnosis Date   Allergy    Anemia    Anxiety    Arthritis    Breast cancer (HCC) 2015   Cancer (HCC) 2015   breast   GERD (gastroesophageal reflux disease)    Glaucoma    HOH (hard of hearing)    Hx of radiation therapy 07/18/13-08/14/13   right breast total dose 50 Gy   Hyperlipidemia    Neuromuscular disorder (HCC)    Osteoporosis    Personal history of radiation therapy 2015   Seasonal allergies    Sleep apnea    uses a cpap   Thyroid  disease    Wears glasses     Past Surgical History:  Procedure Laterality Date   ABDOMINAL HYSTERECTOMY  fibroids   BREAST BIOPSY Right 2015   core    BREAST BIOPSY Left 06/18/2018   DENSE FIBROSIS WITH CALCIFICATIONS AND CHRONIC   BREAST BIOPSY Left 06/07/2017   BREAST EXCISIONAL BIOPSY Right 2005   BREAST LUMPECTOMY Right    cataract surgery Left 10/04/2023   COLONOSCOPY     LEFT OOPHORECTOMY     PARTIAL MASTECTOMY WITH NEEDLE LOCALIZATION AND AXILLARY SENTINEL LYMPH NODE BX Right 05/28/2013   Procedure: PARTIAL MASTECTOMY WITH NEEDLE LOCALIZATION AND AXILLARY  SENTINEL LYMPH NODE BIOPSY;  Surgeon: Elon CHRISTELLA Pacini, MD;  [facility]  [facility];  Service: General;  Laterality: Right;    Family History  Problem Relation Age of Onset   Hypertension Mother    Diabetes Maternal Aunt    Stroke Other        grandfather   Cancer Maternal Uncle    Breast cancer Neg Hx     Social History:  reports that she has never smoked. She has never used smokeless tobacco. She reports current alcohol use. She reports that she does not use drugs.  Allergies:  Allergies  Allergen Reactions   Codeine     REACTION: sick to stomach    Medications: I have reviewed the patient's current medications.  The PMH, PSH, Medications, Allergies, and SH were reviewed and updated.  ROS: Constitutional: Negative for fever, weight loss and weight gain. Cardiovascular: Negative for chest pain and dyspnea on exertion. Respiratory: Is not experiencing shortness of breath at rest. Gastrointestinal: Negative for nausea and vomiting. Neurological: Negative for headaches. Psychiatric: The patient is not nervous/anxious  Blood pressure 132/74, pulse 75, SpO2 95%. There is no height or weight on file to calculate BMI.  PHYSICAL EXAM:  Exam: General: Well-developed, well-nourished Communication and Voice: Clear pitch and clarity Respiratory Respiratory effort: Equal inspiration and expiration without stridor Cardiovascular Peripheral Vascular: Warm extremities with equal color/perfusion Eyes: No nystagmus with equal extraocular motion bilaterally Neuro/Psych/Balance: Patient oriented to person, place, and time; Appropriate mood and affect; Gait is intact with no imbalance; Cranial nerves I-XII are intact Head and Face Inspection: Normocephalic and atraumatic without mass or lesion Palpation: Facial skeleton intact without bony stepoffs Salivary Glands: No mass or tenderness Facial Strength: Facial motility symmetric and full bilaterally ENT Pinna: External  ear intact and fully developed External canal: Canal is patent with intact skin Tympanic Membrane: Clear and mobile External Nose: No scar or anatomic deformity Internal Nose: Septum is straight. No polyp, or purulence. Mucosal edema and erythema present.  Bilateral inferior turbinate hypertrophy.  Lips, Teeth, and gums: Mucosa and teeth intact and viable TMJ: No pain to palpation with full mobility Oral cavity/oropharynx: No erythema or exudate, no lesions present Nasopharynx: No mass or lesion with intact mucosa Hypopharynx: Intact mucosa without pooling of secretions Larynx Glottic: Full true vocal cord mobility without lesion or mass Supraglottic: Normal appearing epiglottis and AE folds Interarytenoid Space: Moderate pachydermia&edema Subglottic Space: Patent without lesion or edema Neck Neck and Trachea: Midline trachea without mass or lesion Thyroid : No mass or nodularity Lymphatics: No lymphadenopathy  Procedure: Preoperative diagnosis: dyspnea and throat tightness episodes  Postoperative diagnosis:   Same + GERD LPR  Procedure: Flexible fiberoptic laryngoscopy  Surgeon: Elena Larry, MD  Anesthesia: Topical lidocaine  and Afrin Complications: None Condition is stable throughout exam  Indications and consent:  The patient presents to the clinic with above symptoms. Indirect laryngoscopy view was incomplete. Thus it was recommended that they undergo a flexible fiberoptic laryngoscopy. All of the risks,  benefits, and potential complications were reviewed with the patient preoperatively and verbal informed consent was obtained.  Procedure: The patient was seated upright in the clinic. Topical lidocaine  and Afrin were applied to the nasal cavity. After adequate anesthesia had occurred, I then proceeded to pass the flexible telescope into the nasal cavity. The nasal cavity was patent without rhinorrhea or polyp. The nasopharynx was also patent without mass or lesion. The  base of tongue was visualized and was normal. There were no signs of pooling of secretions in the piriform sinuses. The true vocal folds were mobile bilaterally. There were no signs of glottic or supraglottic mucosal lesion or mass. There was moderate interarytenoid pachydermia and post cricoid edema. The telescope was then slowly withdrawn and the patient tolerated the procedure throughout.   Studies Reviewed: echo done 01/11/2017 - reported normal by Cards  Assessment/Plan: Encounter Diagnoses  Name Primary?   Dyspnea, unspecified type Yes   Dysphonia    Chronic GERD     Assessment and Plan Assessment & Plan Dyspnea episodes accompanied by sensation of tightness and inability to exhale  Intermittent dyspnea for four years, difficulty exhaling, possible laryngospasm from reflux, reactive airway disease, or bronchospasm. Laryngeal exam showed open airways with postnasal drainage and changes c/w GERD LPR. Further pulmonary evaluation needed. - Refer to pulmonary physician for pulmonary function testing and other workup. - Order chest x-ray. - Refer to speech therapy for rescue breathing techniques in case sx are c/w VCD although my exam did not show evidence of it and she reports no trouble with inspiration but with exhalation   Dysphonia Intermittent dysphonia with dyspnea episodes. Laryngeal exam did not reveal any obvious structural abnormalities. - Refer to speech therapy for rescue breathing techniques.  Gastroesophageal reflux disease (GERD) Possible contributor to laryngospasm and dyspnea. Prescribed GERD medication and seaweed-based supplement to prevent reflux. - continue Nexium  40 mg daily -  Reflux Gourmet after meals - diet and lifestyle changes to minimize GERD - Refer to [employer] blog for dietary and lifestyle modifications/reflux cook book  Chronic nasal congestion Postnasal drainage Postnasal drainage observed during laryngeal exam. Nasal spray prescription not  started. - Advised use of nasal spray (Flonase ) daily. - Advise use of Xyzal  5 mg daily. - both were sent to the pharmacy   Thank you for allowing me to participate in the care of this patient. Please do not hesitate to contact me with any questions or concerns.   Elena Larry, MD Otolaryngology [facility] Phone: [phone] Fax: [phone]    01/02/2024, 1:59 PM

## 2024-01-02 NOTE — Patient Instructions (Signed)

## 2024-01-05 ENCOUNTER — Ambulatory Visit: Attending: Otolaryngology | Admitting: Speech Pathology

## 2024-01-05 ENCOUNTER — Encounter: Payer: Self-pay | Admitting: Speech Pathology

## 2024-01-05 DIAGNOSIS — J383 Other diseases of vocal cords: Secondary | ICD-10-CM | POA: Insufficient documentation

## 2024-01-05 DIAGNOSIS — R498 Other voice and resonance disorders: Secondary | ICD-10-CM | POA: Diagnosis not present

## 2024-01-05 NOTE — Therapy (Signed)
 OUTPATIENT SPEECH LANGUAGE PATHOLOGY VOICE EVALUATION   Patient Name: Monica Diaz MRN: [id number] DOB:1941/02/02, 83 y.o., female Today's Date: 01/05/2024  PCP: Mahlon Comer BRAVO, MD REFERRING PROVIDER: Okey Burns MD  END OF SESSION:  End of Session - 01/05/24 1018     Visit Number 1    Number of Visits 9    Date for SLP Re-Evaluation 03/06/24    SLP Start Time 1015    SLP Stop Time  1100    SLP Time Calculation (min) 45 min    Activity Tolerance Patient tolerated treatment well          Past Medical History:  Diagnosis Date   Allergy    Anemia    Anxiety    Arthritis    Breast cancer (HCC) 2015   Cancer (HCC) 2015   breast   GERD (gastroesophageal reflux disease)    Glaucoma    HOH (hard of hearing)    Hx of radiation therapy 07/18/13-08/14/13   right breast total dose 50 Gy   Hyperlipidemia    Neuromuscular disorder (HCC)    Osteoporosis    Personal history of radiation therapy 2015   Seasonal allergies    Sleep apnea    uses a cpap   Thyroid  disease    Wears glasses    Past Surgical History:  Procedure Laterality Date   ABDOMINAL HYSTERECTOMY     fibroids   BREAST BIOPSY Right 2015   core    BREAST BIOPSY Left 06/18/2018   DENSE FIBROSIS WITH CALCIFICATIONS AND CHRONIC   BREAST BIOPSY Left 06/07/2017   BREAST EXCISIONAL BIOPSY Right 2005   BREAST LUMPECTOMY Right    cataract surgery Left 10/04/2023   COLONOSCOPY     LEFT OOPHORECTOMY     PARTIAL MASTECTOMY WITH NEEDLE LOCALIZATION AND AXILLARY SENTINEL LYMPH NODE BX Right 05/28/2013   Procedure: PARTIAL MASTECTOMY WITH NEEDLE LOCALIZATION AND AXILLARY SENTINEL LYMPH NODE BIOPSY;  Surgeon: Elon CHRISTELLA Pacini, MD;  Location: [facility];  Service: General;  Laterality: Right;   Patient Active Problem List   Diagnosis Date Noted   Vocal cord dysfunction 12/12/2023   GERD (gastroesophageal reflux disease) 12/12/2023   Overweight (BMI 25.0-29.9) 10/31/2023   PAD  (peripheral artery disease) (HCC) 09/22/2022   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   Neuropathy of both feet 03/13/2017   Bilateral hand numbness 02/25/2015   Chronic hand pain 12/08/2014   Sleep behavior disorder, 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   Other neutropenia (HCC) 05/13/2011   General medical examination 05/13/2011   BUNION 03/25/2010   Hypothyroidism 12/02/2009   Vitamin D  deficiency 02/10/2009   Hyperlipidemia 12/22/2008   Allergic rhinitis 12/22/2008   NECK PAIN 12/22/2008   Osteoporosis 12/22/2008   VERTIGO 12/22/2008    Onset date: referred on 01/02/24  REFERRING DIAG:  Diagnosis  R06.00 (ICD-10-CM) - Dyspnea, unspecified type    THERAPY DIAG:  Other voice and resonance disorders  Rationale for Evaluation and Treatment: Rehabilitation  SUBJECTIVE:   SUBJECTIVE STATEMENT: Pt was pleasant and cooperative throughout assessment.   Pt accompanied by: self  PERTINENT HISTORY: Hypothyroid  PAIN:  Are you having pain? No  FALLS: Has patient fallen in last 6 months? 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 (4, 8 oz)  - recommending increase intake - add flavoring and/or try straw Caffeine/alcohol intake: 3 large cups of coffee/day; 1-2/week  Daily voice use: minimal; living alone, but talks on the phone to relatives  PERCEPTUAL VOICE ASSESSMENT: Voice quality: hoarse and strained; intermittent Vocal abuse: habitual throat clearing Resonance: normal Respiratory function: thoracic  breathing  OBJECTIVE VOICE ASSESSMENT: Maximum phonation time for sustained ah: - Conversational pitch average: - Hz Conversational pitch range: - Hz Conversational loudness average: - dB Conversational loudness range: - dB S/z ratio: - (Suggestive of dysfunction >1.0)  PATIENT REPORTED OUTCOME MEASURES (PROM): [facility]): 15.9 (73/98 pts) - Total obstruction: 5 - Total pain/thermal: 6.6  -Total throat tickle: 4.3   *Higher scores indicate less bothersome sx  Vocal Cord Dysfunction Questionnaire (VCD-Q): 37/60 *Score 12 and below is considered normal                                                                                                                           TREATMENT DATE:      PATIENT EDUCATION: Education details: VCD; chronic cough Person educated: Patient Education method: Explanation Education comprehension: verbalized understanding and needs further education   GOALS: Goals reviewed with patient? Yes  SHORT TERM GOALS: Target date: 02/04/24  Pt will identify (and attempt to reduce) acid reflux triggers  Baseline: Goal status: INITIAL  2.  Pt will verbalize cough substitution strategies  Baseline:  Goal status: INITIAL  3.  Pt will demonstrate rescue breathing techniques with minA Baseline:  Goal status: INITIAL    LONG TERM GOALS: Target date: 03/06/24  Pt will reduce cough/throat clears to <5 per session  Baseline:  Goal status: INITIAL  2.  Pt will improve score on PROM Baseline:  Goal status: INITIAL    ASSESSMENT:  CLINICAL IMPRESSION: Pt is a 83 yo female who presents to ST OP referred for possible Vocal Cord Dysfunction. Pt endorses she would be talking and she would have a sudden episode where she could not INHALE or EXHALE - I am just stuck. She had one instance of choking a while back and she feels like these episodes are similar. Overall, she has had 3 episodes in the past 3 months. Episodes began with an inhale.  SLP observed intermittent dysphonia - with strangled and hoarse vocal quality.  Pt was assessed using PROM measures re: [place] Laryngeal Hypersensitivity Questionnaire, Vocal Cord Dysfunction Questionnaire, and case history. See above for results. Pt coughed and throat cleared for the entirety of today's session. SLP observed pt to cough x12 and throat clear x8 during today's 40 minute session. She reports this started several years ago and then went away. She reported it started up again around 4-6 months ago. Some coughs are productive, though not all. She also reports globus sensation. Denies any swallowing problems. SLP rec skilled ST services to  address rescue breathing techniques, throat clear/cough substitution strategies and acid reflux management .     OBJECTIVE IMPAIRMENTS: include voice disorder. These impairments are limiting patient from effectively communicating at home and in community. Factors affecting potential to achieve goals and functional outcome are NA.SABRA Patient will benefit from skilled SLP services to address above impairments and improve overall function.  REHAB POTENTIAL: Good  PLAN:  SLP FREQUENCY: 1x/week  SLP DURATION: 8 weeks  PLANNED INTERVENTIONS: Environmental controls, Functional tasks, SLP instruction and feedback, Compensatory strategies, Patient/family education, and [id number] Treatment of speech (30 or 45 min)     [employer], CCC-SLP 01/05/2024, 11:45 AM

## 2024-01-08 ENCOUNTER — Ambulatory Visit: Admitting: Speech Pathology

## 2024-01-08 ENCOUNTER — Encounter: Payer: Self-pay | Admitting: Speech Pathology

## 2024-01-08 DIAGNOSIS — J383 Other diseases of vocal cords: Secondary | ICD-10-CM | POA: Diagnosis not present

## 2024-01-08 DIAGNOSIS — R498 Other voice and resonance disorders: Secondary | ICD-10-CM

## 2024-01-08 NOTE — Therapy (Unsigned)
 OUTPATIENT SPEECH LANGUAGE PATHOLOGY VOICE TREATMENT   Patient Name: Monica Diaz MRN: [id number] DOB:08-02-1940, 83 y.o., female Today's Date: 01/08/2024  PCP: Mahlon Comer BRAVO, MD REFERRING PROVIDER: Okey Burns MD  END OF SESSION:  End of Session - 01/08/24 0935     Visit Number 2    Number of Visits 9    Date for SLP Re-Evaluation 03/06/24    SLP Start Time 0930    SLP Stop Time  1000   Pt had to leave early   SLP Time Calculation (min) 30 min    Activity Tolerance Patient tolerated treatment well          Past Medical History:  Diagnosis Date   Allergy    Anemia    Anxiety    Arthritis    Breast cancer (HCC) 2015   Cancer (HCC) 2015   breast   GERD (gastroesophageal reflux disease)    Glaucoma    HOH (hard of hearing)    Hx of radiation therapy 07/18/13-08/14/13   right breast total dose 50 Gy   Hyperlipidemia    Neuromuscular disorder (HCC)    Osteoporosis    Personal history of radiation therapy 2015   Seasonal allergies    Sleep apnea    uses a cpap   Thyroid  disease    Wears glasses    Past Surgical History:  Procedure Laterality Date   ABDOMINAL HYSTERECTOMY     fibroids   BREAST BIOPSY Right 2015   core    BREAST BIOPSY Left 06/18/2018   DENSE FIBROSIS WITH CALCIFICATIONS AND CHRONIC   BREAST BIOPSY Left 06/07/2017   BREAST EXCISIONAL BIOPSY Right 2005   BREAST LUMPECTOMY Right    cataract surgery Left 10/04/2023   COLONOSCOPY     LEFT OOPHORECTOMY     PARTIAL MASTECTOMY WITH NEEDLE LOCALIZATION AND AXILLARY SENTINEL LYMPH NODE BX Right 05/28/2013   Procedure: PARTIAL MASTECTOMY WITH NEEDLE LOCALIZATION AND AXILLARY SENTINEL LYMPH NODE BIOPSY;  Surgeon: Elon CHRISTELLA Pacini, MD;  Location: [facility];  Service: General;  Laterality: Right;   Patient Active Problem List   Diagnosis Date Noted   Vocal cord dysfunction 12/12/2023   GERD (gastroesophageal reflux disease) 12/12/2023   Overweight (BMI 25.0-29.9)  10/31/2023   PAD (peripheral artery disease) (HCC) 09/22/2022   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   Neuropathy of both feet 03/13/2017   Bilateral hand numbness 02/25/2015   Chronic hand pain 12/08/2014   Sleep behavior disorder, 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   Other neutropenia (HCC) 05/13/2011   General medical examination 05/13/2011   BUNION 03/25/2010   Hypothyroidism 12/02/2009   Vitamin D  deficiency 02/10/2009   Hyperlipidemia 12/22/2008   Allergic rhinitis 12/22/2008   NECK PAIN 12/22/2008   Osteoporosis 12/22/2008   VERTIGO 12/22/2008    Onset date: referred on 01/02/24  REFERRING DIAG:  Diagnosis  R06.00 (ICD-10-CM) - Dyspnea, unspecified type    THERAPY DIAG:  Other voice and resonance disorders  Rationale for Evaluation and Treatment: Rehabilitation  SUBJECTIVE:   SUBJECTIVE STATEMENT: Pt was pleasant and cooperative throughout assessment.   Pt accompanied by: self  PERTINENT HISTORY: Hypothyroid  PAIN:  Are you having pain? No  FALLS: Has patient fallen in last 6 months? 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 (4, 8 oz)  - recommending increase intake - add flavoring and/or try straw Caffeine/alcohol intake: 3 large cups of coffee/day; 1-2/week  Daily voice use: minimal; living alone, but talks on the phone to relatives  PERCEPTUAL VOICE ASSESSMENT: Voice quality: hoarse and strained; intermittent Vocal abuse: habitual throat clearing Resonance: normal Respiratory function:  thoracic breathing  OBJECTIVE VOICE ASSESSMENT: Maximum phonation time for sustained ah: - Conversational pitch average: - Hz Conversational pitch range: - Hz Conversational loudness average: - dB Conversational loudness range: - dB S/z ratio: - (Suggestive of dysfunction >1.0)  PATIENT REPORTED OUTCOME MEASURES (PROM): [place] [address] [employer]): 15.9 (73/98 pts) - Total obstruction: 5 - Total pain/thermal: 6.6  -Total throat tickle: 4.3   *Higher scores indicate less bothersome sx  Vocal Cord Dysfunction Questionnaire (VCD-Q): 37/60 *Score 12 and below is considered normal                                                                                                                           TREATMENT DATE:      PATIENT EDUCATION: Education details: VCD; chronic cough Person educated: Patient Education method: Explanation Education comprehension: verbalized understanding and needs further education   GOALS: Goals reviewed with patient? Yes  SHORT TERM GOALS: Target date: 02/04/24  Pt will identify (and attempt to reduce) acid reflux triggers  Baseline: Goal status: INITIAL  2.  Pt will verbalize cough substitution strategies  Baseline:  Goal status: INITIAL  3.  Pt will demonstrate rescue breathing techniques with minA Baseline:  Goal status: INITIAL    LONG TERM GOALS: Target date: 03/06/24  Pt will reduce cough/throat clears to <5 per session  Baseline:  Goal status: INITIAL  2.  Pt will improve score on PROM Baseline:  Goal status: INITIAL    ASSESSMENT:  CLINICAL IMPRESSION: Pt is a 83 yo female who presents to ST OP referred for possible Vocal Cord Dysfunction. Pt endorses she would be talking and she would have a sudden episode where she could not INHALE or EXHALE - I am just stuck. She had one instance of choking a while back and she feels like these episodes are similar. Overall, she has had 3 episodes in the past 3 months. Episodes began with an  inhale. SLP observed intermittent dysphonia - with strangled and hoarse vocal quality.  Pt was assessed using PROM measures re: [place] Laryngeal Hypersensitivity Questionnaire, Vocal Cord Dysfunction Questionnaire, and case history. See above for results. Pt coughed and throat cleared for the entirety of today's session. SLP observed pt to cough x12 and throat clear x8 during today's 40 minute session. She reports this started several years ago and then went away. She reported it started up again around 4-6 months ago. Some coughs are productive, though not all. She also reports globus sensation. Denies any  swallowing problems. SLP rec skilled ST services to address rescue breathing techniques, throat clear/cough substitution strategies and acid reflux management .     OBJECTIVE IMPAIRMENTS: include voice disorder. These impairments are limiting patient from effectively communicating at home and in community. Factors affecting potential to achieve goals and functional outcome are NA.[email or web address] Patient will benefit from skilled SLP services to address above impairments and improve overall function.  REHAB POTENTIAL: Good  PLAN:  SLP FREQUENCY: 1x/week  SLP DURATION: 8 weeks  PLANNED INTERVENTIONS: Environmental controls, Functional tasks, SLP instruction and feedback, Compensatory strategies, Patient/family education, and 07492 Treatment of speech (30 or 45 min)     [employer], CCC-SLP 01/08/2024, 9:36 AM

## 2024-01-13 ENCOUNTER — Other Ambulatory Visit: Payer: Self-pay | Admitting: Family Medicine

## 2024-01-13 DIAGNOSIS — M8000XG Age-related osteoporosis with current pathological fracture, unspecified site, subsequent encounter for fracture with delayed healing: Secondary | ICD-10-CM

## 2024-01-17 ENCOUNTER — Ambulatory Visit: Admitting: Speech Pathology

## 2024-01-17 ENCOUNTER — Encounter: Payer: Self-pay | Admitting: Speech Pathology

## 2024-01-17 DIAGNOSIS — R498 Other voice and resonance disorders: Secondary | ICD-10-CM | POA: Diagnosis not present

## 2024-01-17 DIAGNOSIS — J383 Other diseases of vocal cords: Secondary | ICD-10-CM | POA: Diagnosis not present

## 2024-01-17 NOTE — Therapy (Signed)
 OUTPATIENT SPEECH LANGUAGE PATHOLOGY VOICE TREATMENT   Patient Name: Monica Diaz MRN: [id number] DOB:07/11/1940, 83 y.o., female Today's Date: 01/17/2024  PCP: Mahlon Comer BRAVO, MD REFERRING PROVIDER: Okey Burns MD  END OF SESSION:  End of Session - 01/17/24 0806     Visit Number 3    Number of Visits 9    Date for Recertification  03/06/24    SLP Start Time 0800    SLP Stop Time  0840    SLP Time Calculation (min) 40 min    Activity Tolerance Patient tolerated treatment well          Past Medical History:  Diagnosis Date   Allergy    Anemia    Anxiety    Arthritis    Breast cancer (HCC) 2015   Cancer (HCC) 2015   breast   GERD (gastroesophageal reflux disease)    Glaucoma    HOH (hard of hearing)    Hx of radiation therapy 07/18/13-08/14/13   right breast total dose 50 Gy   Hyperlipidemia    Neuromuscular disorder (HCC)    Osteoporosis    Personal history of radiation therapy 2015   Seasonal allergies    Sleep apnea    uses a cpap   Thyroid  disease    Wears glasses    Past Surgical History:  Procedure Laterality Date   ABDOMINAL HYSTERECTOMY     fibroids   BREAST BIOPSY Right 2015   core    BREAST BIOPSY Left 06/18/2018   DENSE FIBROSIS WITH CALCIFICATIONS AND CHRONIC   BREAST BIOPSY Left 06/07/2017   BREAST EXCISIONAL BIOPSY Right 2005   BREAST LUMPECTOMY Right    cataract surgery Left 10/04/2023   COLONOSCOPY     LEFT OOPHORECTOMY     PARTIAL MASTECTOMY WITH NEEDLE LOCALIZATION AND AXILLARY SENTINEL LYMPH NODE BX Right 05/28/2013   Procedure: PARTIAL MASTECTOMY WITH NEEDLE LOCALIZATION AND AXILLARY SENTINEL LYMPH NODE BIOPSY;  Surgeon: Elon CHRISTELLA Pacini, MD;  [facility];  Service: General;  Laterality: Right;   Patient Active Problem List   Diagnosis Date Noted   Vocal cord dysfunction 12/12/2023   GERD (gastroesophageal reflux disease) 12/12/2023   Overweight (BMI 25.0-29.9) 10/31/2023   PAD (peripheral  artery disease) 09/22/2022   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   Neuropathy of both feet 03/13/2017   Bilateral hand numbness 02/25/2015   Chronic hand pain 12/08/2014   Sleep behavior disorder, 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   Pt accompanied by: self  PERTINENT HISTORY: Hypothyroid  PAIN:  Are you having pain? No  FALLS: Has patient fallen in last 6 months? 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 (4, 8 oz)  - recommending increase intake - add flavoring and/or try straw Caffeine/alcohol intake: 3 large cups of coffee/day; 1-2/week  Daily voice use: minimal; living alone, but talks on the phone to relatives  PERCEPTUAL VOICE ASSESSMENT: Voice quality: hoarse and strained; intermittent Vocal abuse: habitual throat clearing Resonance: normal Respiratory function: thoracic breathing  OBJECTIVE VOICE ASSESSMENT: Maximum phonation time  for sustained ah: - Conversational pitch average: - Hz Conversational pitch range: - Hz Conversational loudness average: - dB Conversational loudness range: - dB S/z ratio: - (Suggestive of dysfunction >1.0)  PATIENT REPORTED OUTCOME MEASURES (PROM): [place] [facility]): 15.9 (73/98 pts) - Total obstruction: 5 - Total pain/thermal: 6.6  -Total throat tickle: 4.3   *Higher scores indicate less bothersome sx  Vocal Cord Dysfunction Questionnaire (VCD-Q): 37/60 *Score 12 and below is considered normal                                                                                                                           TREATMENT DATE:   01/17/24: Pt was seen for skilled ST services targeting suspected VCD. Questions about how the breathing exercises work physiologically. Initiated session with diaphragmatic belly breathing - supine was able to achieve vs sitting. She has initiated acid reflux  and post nasal meds.  Cough x 5II  Educated on cough/throat clear substitution strategies. Pt reports she would be most likely to use re: hard swallow and sips of water.    01/10/24: Pt was seen for skilled ST services targeting suspected VCD. SLP initiated education on rescue breathing techniques with demonstration and practice. Pt required overall minA to complete sniff-breathe and overall maxA verbal/visual cues for diaphragmatic breathing. SLP provided hand out with instructions for continued home practice. Cough/throat clears were <10 (total) today.    PATIENT EDUCATION: Education details: VCD; chronic cough Person educated: Patient Education method: Explanation Education comprehension: verbalized understanding and needs further education   GOALS: Goals reviewed with patient? Yes  SHORT TERM GOALS: Target date: 02/04/24  Pt will identify (and attempt to reduce) acid reflux triggers  Baseline: Goal status: INITIAL  2.  Pt will verbalize cough substitution strategies  Baseline:  Goal  status: INITIAL  3.  Pt will demonstrate rescue breathing techniques with minA Baseline:  Goal status: INITIAL    LONG TERM GOALS: Target date: 03/06/24  Pt will reduce cough/throat clears to <5 per session  Baseline:  Goal status: INITIAL  2.  Pt will improve score on PROM Baseline:  Goal status: INITIAL    ASSESSMENT:  CLINICAL IMPRESSION: Pt is a 83 yo female who presents to ST OP referred for possible Vocal Cord Dysfunction. Pt endorses she would be talking and she would have a sudden episode where she could not INHALE or EXHALE - I am just stuck. She had one instance of choking a while back and she feels  like these episodes are similar. Overall, she has had 3 episodes in the past 3 months. Episodes began with an inhale. SLP observed intermittent dysphonia - with strangled and hoarse vocal quality.  Pt was assessed using PROM measures re: [place] Laryngeal Hypersensitivity Questionnaire, Vocal Cord Dysfunction Questionnaire, and case history. See above for results. Pt coughed and throat cleared for the entirety of today's session. SLP observed pt to cough x12 and throat clear x8 during today's 40 minute session. She reports this started several years ago and then went away. She reported it started up again around 4-6 months ago. Some coughs are productive, though not all. She also reports globus sensation. Denies any swallowing problems. SLP rec skilled ST services to address rescue breathing techniques, throat clear/cough substitution strategies and acid reflux management .     OBJECTIVE IMPAIRMENTS: include voice disorder. These impairments are limiting patient from effectively communicating at home and in community. Factors affecting potential to achieve goals and functional outcome are NA.SABRA Patient will benefit from skilled SLP services to address above impairments and improve overall function.  REHAB POTENTIAL: Good  PLAN:  SLP FREQUENCY: 1x/week  SLP DURATION: 8  weeks  PLANNED INTERVENTIONS: Environmental controls, Functional tasks, SLP instruction and feedback, Compensatory strategies, Patient/family education, and 07492 Treatment of speech (30 or 45 min)     [employer], CCC-SLP 01/17/2024, 8:07 AM

## 2024-01-24 ENCOUNTER — Ambulatory Visit

## 2024-01-24 ENCOUNTER — Ambulatory Visit (INDEPENDENT_AMBULATORY_CARE_PROVIDER_SITE_OTHER)

## 2024-01-24 VITALS — BP 126/76 | HR 91 | Temp 98.2°F | Ht 64.0 in | Wt 163.6 lb

## 2024-01-24 DIAGNOSIS — Z23 Encounter for immunization: Secondary | ICD-10-CM

## 2024-01-24 DIAGNOSIS — I771 Stricture of artery: Secondary | ICD-10-CM | POA: Diagnosis not present

## 2024-01-24 DIAGNOSIS — R0602 Shortness of breath: Secondary | ICD-10-CM

## 2024-01-24 NOTE — Patient Instructions (Addendum)
 It was nice meeting you today.   Please continue taking your acid reflux medication and your nose spray. We will get a chest xray for you and pulmonary function tests.  Please find this information below for a GERD diet   Foods to Avoid:  Acidic foods: Citrus fruits, tomatoes, tomato-based products, vinegar, garlic, onions  Fatty foods: Fried foods, fatty meats, whole milk, cheese  Spicy foods: Chili peppers, black pepper, mustard  Chocolate: Contains caffeine and theobromine, which relax the lower esophageal sphincter (LES)  Alcohol: Relaxes the LES and increases stomach acid production  Caffeine: Found in coffee, tea, and some sodas, it can stimulate stomach acid production    Foods to Eat:  Lean proteins: Chicken, fish, eggs Non-acidic fruits: Apples, bananas, pears, grapes Vegetables: Steamed, roasted, or boiled vegetables (e.g., broccoli, carrots, green beans) Whole grains: Brown rice, quinoa, oatmeal Low-fat dairy: Skim milk, low-fat yogurt, low-fat cheese Water: Staying hydrated helps dilute stomach acid    Other Dietary Recommendations: Eat smaller, more frequent meals. Avoid eating within 2-3 hours of bedtime. Chew food thoroughly. Limit sugary drinks and processed foods. Consider a Mediterranean-style diet, which is rich in fruits, vegetables, and whole grains.

## 2024-01-24 NOTE — Addendum Note (Signed)
 Addended by: ROLANDA POWELL SAILOR on: 01/24/2024 12:11 PM   Modules accepted: Orders

## 2024-01-24 NOTE — Progress Notes (Signed)
 Subjective:   PATIENT ID: Monica Diaz GENDER: female DOB: 08-15-1940, MRN: [id number]   HPI 82 year old female with a past medical history of osteoporosis, breast cancer, glaucoma, hypothyroidism, GERD who is presenting to the pulmonary clinic for evaluation of shortness of breath and episodic inability to exhale after inhalation.  She states that the first time it happened was 4 years ago while she was eating a piece of menthol candy.  This year in June it started happening again where she needed someone to do the Heimlich maneuver so that she can breathe again although nothing came out.  She also complains of significant postnasal drip and she has been using Flonase  which has helped significantly with her symptoms.  She also reports that she was diagnosed with GERD although she does not report any heartburn but reports that she was told that GERD can cause cough.  She is not sure if the medication for GERD has helped with her coughing episodes.  She is very active and walks about a mile 2 times a week.  It takes her 15 minutes to do that.  In the past she walked 3 miles and took it 50 minutes to do that.  She also goes to the gym.  She is a never smoker.  She has no other pulmonary disease.  She was seen by ENT and had a laryngoscopy which did not show any anatomical abnormality.  She is suspected to have possible vocal cord dysfunction but she will need pulmonary rule out of any other pulmonary etiology of her symptoms.  Past Medical History:  Diagnosis Date   Allergy    Anemia    Anxiety    Arthritis    Breast cancer (HCC) 2015   Cancer (HCC) 2015   breast   GERD (gastroesophageal reflux disease)    Glaucoma    HOH (hard of hearing)    Hx of radiation therapy 07/18/13-08/14/13   right breast total dose 50 Gy   Hyperlipidemia    Neuromuscular disorder (HCC)    Osteoporosis    Personal history of radiation therapy 2015   Seasonal allergies    Sleep apnea    uses a cpap    Thyroid  disease    Wears glasses      Family History  Problem Relation Age of Onset   Hypertension Mother    Diabetes Maternal Aunt    Stroke Other        grandfather   Cancer Maternal Uncle    Breast cancer Neg Hx      Social History   Socioeconomic History   Marital status: Divorced    Spouse name: Not on file   Number of children: 2   Years of education: Not on file   Highest education level: Doctorate  Occupational History    Employer: RETIRED  Tobacco Use   Smoking status: Never   Smokeless tobacco: Never  Vaping Use   Vaping status: Never Used  Substance and Sexual Activity   Alcohol use: Yes    Comment: occasionally   Drug use: No   Sexual activity: Not Currently  Other Topics Concern   Not on file  Social History Narrative   Right handed, Caffeine 2-3 cups average daily.  Divorced, 2 daughters.     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     Social Drivers of Corporate investment banker Strain: Low Risk  (10/26/2023)   Overall Financial Resource Strain (CARDIA)    Difficulty of Paying Living  Expenses: Not hard at all  Food Insecurity: No Food Insecurity (10/26/2023)   Hunger Vital Sign    Worried About Running Out of Food in the Last Year: Never true    Ran Out of Food in the Last Year: Never true  Transportation Needs: No Transportation Needs (10/26/2023)   PRAPARE - Administrator, Civil Service (Medical): No    Lack of Transportation (Non-Medical): No  Physical Activity: Insufficiently Active (10/26/2023)   Exercise Vital Sign    Days of Exercise per Week: 2 days    Minutes of Exercise per Session: 30 min  Stress: No Stress Concern Present (10/26/2023)   [employer] of Occupational Health - Occupational Stress Questionnaire    Feeling of Stress: Not at all  Social Connections: Moderately Integrated (10/26/2023)   Social Connection and Isolation Panel    Frequency of Communication with Friends and Family: Three times a week    Frequency of Social  Gatherings with Friends and Family: Once a week    Attends Religious Services: More than 4 times per year    Active Member of [employer] or Organizations: Yes    Attends Engineer, structural: More than 4 times per year    Marital Status: Divorced  Intimate Partner Violence: Not At Risk (09/28/2022)   Humiliation, Afraid, Rape, and Kick questionnaire    Fear of Current or Ex-Partner: No    Emotionally Abused: No    Physically Abused: No    Sexually Abused: No     Allergies  Allergen Reactions   Codeine     REACTION: sick to stomach     Outpatient Medications Prior to Visit  Medication Sig Dispense Refill   alendronate  (FOSAMAX ) 70 MG tablet TAKE 1 TABLET BY MOUTH 1 TIME EVERY WEEK WITH A FULL GLASS OF WATER AND ON AN EMPTY STOMACH 12 tablet 0   calcium  citrate-vitamin D  (CITRACAL+D) 315-200 MG-UNIT per tablet Take 1 tablet by mouth 2 (two) times daily.     Coenzyme Q10 (COQ10) 200 MG CAPS Take 1 capsule by mouth at bedtime.     esomeprazole  (NEXIUM ) 40 MG capsule Take 1 capsule (40 mg total) by mouth daily. 30 capsule 3   fluticasone  (FLONASE ) 50 MCG/ACT nasal spray Place 2 sprays into both nostrils 2 (two) times daily. 16 g 6   hydroxypropyl methylcellulose / hypromellose (ISOPTO TEARS / GONIOVISC) 2.5 % ophthalmic solution Place 1 drop into both eyes as needed for dry eyes.     levocetirizine (XYZAL  ALLERGY 24HR) 5 MG tablet Take 1 tablet (5 mg total) by mouth every evening. 30 tablet 3   levothyroxine  (SYNTHROID ) 100 MCG tablet TAKE 1 TABLET(100 MCG) BY MOUTH DAILY 30 tablet 4   MAGNESIUM  CITRATE PO Take 250 mg by mouth daily.     pyridoxine (B-6) 100 MG tablet Take 100 mg by mouth 2 (two) times daily.     Red Yeast Rice Extract ([employer] RED YEAST RICE) 600 MG CAPS Take 2 capsules (1,200 mg total) by mouth 2 (two) times daily.  0   vitamin B-12 (CYANOCOBALAMIN) 50 MCG tablet Take 50 mcg by mouth.     vitamin C (ASCORBIC ACID) 500 MG tablet Take 500 mg by mouth 2 (two) times daily.      Zinc 50 MG TABS Take 50 mg by mouth daily.     moxifloxacin (VIGAMOX) 0.5 % ophthalmic solution Place 1 drop into the right eye 4 (four) times daily.     Multiple Vitamins-Minerals (ZINC PO)  Take by mouth. (Patient not taking: Reported on 01/24/2024)     ketorolac (ACULAR) 0.5 % ophthalmic solution Place 1 drop into the right eye 4 (four) times daily. (Patient not taking: Reported on 01/24/2024)     prednisoLONE acetate (PRED FORTE) 1 % ophthalmic suspension Place 1 drop into the right eye 4 (four) times daily.     No facility-administered medications prior to visit.    ROS Reviewed all systems and reported negative except as above     Objective:   Vitals:   01/24/24 1126  BP: 126/76  Pulse: 91  Temp: 98.2 F (36.8 C)  TempSrc: Oral  SpO2: 98%  Weight: 163 lb 9.6 oz (74.2 kg)  Height: 5' 4 (1.626 m)    Physical Exam General: Elderly female, well-appearing no acute distress Chest: Clear to auscultation bilaterally Heart: Regular rate and rhythm, normal S1, normal S2 Extremities: Warm, well-perfused Neuro: Grossly intact    CBC    Component Value Date/Time   WBC 4.3 10/31/2023 1030   RBC 3.88 10/31/2023 1030   HGB 11.5 (L) 10/31/2023 1030   HGB 11.4 (L) 08/04/2022 1250   HGB 11.8 06/16/2016 1300   HCT 35.4 (L) 10/31/2023 1030   HCT 36.3 06/16/2016 1300   PLT 236.0 10/31/2023 1030   PLT 219 08/04/2022 1250   PLT 235 06/16/2016 1300   MCV 91.2 10/31/2023 1030   MCV 88.8 06/16/2016 1300   MCH 30.9 08/04/2022 1250   MCHC 32.6 10/31/2023 1030   RDW 15.2 10/31/2023 1030   RDW 15.1 (H) 06/16/2016 1300   LYMPHSABS 1.7 10/31/2023 1030   LYMPHSABS 2.0 06/16/2016 1300   MONOABS 0.5 10/31/2023 1030   MONOABS 0.5 06/16/2016 1300   EOSABS 0.1 10/31/2023 1030   EOSABS 0.1 06/16/2016 1300   BASOSABS 0.0 10/31/2023 1030   BASOSABS 0.0 06/16/2016 1300       Assessment & Plan:   Assessment & Plan Shortness of breath Patient with shortness of breath and dry cough.   No history of smoking to suggest obstructive lung disease.  No wheezing episodes.  No suspicion for asthma at this time although she has had allergies in the past.  Will obtain PFTs to rule out any other pulmonary etiology including restrictive lung disease.  In the future can also consider tracheobronchomalacia as the reason why she has these episodic inability to breath.   She is already followed by SLP for her vocal cord dysfunction.  I advised her to adhere to a strict GERD diet and instructions on how to do that were provided to her.  Advised her to use her Flonase  nasal spray consistently.  Plan to obtain a chest x-ray and PFTs at this time.  Told her that we will consider dynamic CT for tracheal bronchomalacia in the future if her symptoms do not improve.   Orders:   Pulmonary Function Test; Future   DG Chest 2 View; Future    Zola Herter, MD [facility] Office: [phone]

## 2024-01-25 ENCOUNTER — Ambulatory Visit: Attending: Otolaryngology | Admitting: Speech Pathology

## 2024-01-25 ENCOUNTER — Encounter: Payer: Self-pay | Admitting: Speech Pathology

## 2024-01-25 DIAGNOSIS — R498 Other voice and resonance disorders: Secondary | ICD-10-CM | POA: Diagnosis not present

## 2024-01-25 NOTE — Therapy (Unsigned)
 OUTPATIENT SPEECH LANGUAGE PATHOLOGY VOICE TREATMENT   Patient Name: Monica Diaz MRN: [id number] DOB:[birth year], 83 y.o., female Today's Date: 01/25/2024  PCP: Mahlon Comer BRAVO, MD REFERRING PROVIDER: Okey Burns MD  END OF SESSION:  End of Session - 01/25/24 1023     Visit Number 4    Number of Visits 9    Date for Recertification  03/06/24    SLP Start Time 1018    SLP Stop Time  1100    SLP Time Calculation (min) 42 min    Activity Tolerance Patient tolerated treatment well          Past Medical History:  Diagnosis Date   Allergy    Anemia    Anxiety    Arthritis    Breast cancer (HCC) 2015   Cancer (HCC) 2015   breast   GERD (gastroesophageal reflux disease)    Glaucoma    HOH (hard of hearing)    Hx of radiation therapy 07/18/13-08/14/13   right breast total dose 50 Gy   Hyperlipidemia    Neuromuscular disorder (HCC)    Osteoporosis    Personal history of radiation therapy 2015   Seasonal allergies    Sleep apnea    uses a cpap   Thyroid  disease    Wears glasses    Past Surgical History:  Procedure Laterality Date   ABDOMINAL HYSTERECTOMY     fibroids   BREAST BIOPSY Right 2015   core    BREAST BIOPSY Left 06/18/2018   DENSE FIBROSIS WITH CALCIFICATIONS AND CHRONIC   BREAST BIOPSY Left 06/07/2017   BREAST EXCISIONAL BIOPSY Right 2005   BREAST LUMPECTOMY Right    cataract surgery Left 10/04/2023   COLONOSCOPY     LEFT OOPHORECTOMY     PARTIAL MASTECTOMY WITH NEEDLE LOCALIZATION AND AXILLARY SENTINEL LYMPH NODE BX Right 05/28/2013   Procedure: PARTIAL MASTECTOMY WITH NEEDLE LOCALIZATION AND AXILLARY SENTINEL LYMPH NODE BIOPSY;  Surgeon: Elon CHRISTELLA Pacini, MD;  Location: [facility];  Service: General;  Laterality: Right;   Patient Active Problem List   Diagnosis Date Noted   Vocal cord dysfunction 12/12/2023   GERD (gastroesophageal reflux disease) 12/12/2023   Overweight (BMI 25.0-29.9) 10/31/2023   PAD (peripheral  artery disease) 09/22/2022   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   Neuropathy of both feet 03/13/2017   Bilateral hand numbness 02/25/2015   Chronic hand pain 12/08/2014   Sleep behavior disorder, 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   Other neutropenia 05/13/2011   General medical examination 05/13/2011   BUNION 03/25/2010   Hypothyroidism 12/02/2009   Vitamin D  deficiency 02/10/2009   Hyperlipidemia 12/22/2008   Allergic rhinitis 12/22/2008   NECK PAIN 12/22/2008   Osteoporosis 12/22/2008   VERTIGO 12/22/2008    Onset date: referred on 01/02/24  REFERRING DIAG:  Diagnosis  R06.00 (ICD-10-CM) - Dyspnea, unspecified type    THERAPY DIAG:  Other voice and resonance disorders  Rationale for Evaluation and Treatment: Rehabilitation  SUBJECTIVE:   SUBJECTIVE STATEMENT: Quite well  Pt accompanied by: self  PERTINENT HISTORY: Hypothyroid  PAIN:  Are you having pain? No  FALLS: Has patient fallen in last 6 months? 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 intake: suboptimal (4, 8 oz)  - recommending increase intake - add flavoring and/or try straw Caffeine/alcohol intake: 3 large cups of coffee/day; 1-2/week  Daily voice use: minimal; living alone, but talks on the phone to relatives  PERCEPTUAL VOICE ASSESSMENT: Voice quality: hoarse and strained; intermittent Vocal abuse: habitual throat clearing Resonance: normal Respiratory function: thoracic breathing  OBJECTIVE VOICE ASSESSMENT: Maximum phonation time for sustained  ah: - Conversational pitch average: - Hz Conversational pitch range: - Hz Conversational loudness average: - dB Conversational loudness range: - dB S/z ratio: - (Suggestive of dysfunction >1.0)  PATIENT REPORTED OUTCOME MEASURES (PROM): [place] [facility]): 15.9 (73/98 pts) - Total obstruction: 5 - Total pain/thermal: 6.6  -Total throat tickle: 4.3   *Higher scores indicate less bothersome sx  Vocal Cord Dysfunction Questionnaire (VCD-Q): 37/60 *Score 12 and below is considered normal                                                                                                                           TREATMENT DATE:   01/25/24: Pt was seen for skilled ST services targeting suspected VCD. Pt reports she saw a pulmonologist since she has last been here. They are going to schedule a PFT to r/o other possible causes. She reports her coughing has been less frequent after starting to use the strategies. She attempted hard swallow and sips of water; though these have not totally eliminated episodes of coughing/throat clears.  TC: x6 *no coughing today Continued practice on diaphragmatic breathing. Requiring maxA verbal and visual cueing while attempting during sitting. Brief education on acid reflux and directed to [employer].   01/17/24: Pt was seen for skilled ST services targeting suspected VCD. Questions about how the breathing exercises work physiologically. Initiated session with diaphragmatic belly breathing - supine was able to achieve vs sitting. She has initiated acid reflux  and post nasal meds.  Cough x 5II  Educated on cough/throat clear substitution strategies. Pt reports she would be most likely to use re: hard swallow and sips of water.    01/10/24: Pt was seen for skilled ST services targeting suspected VCD. SLP initiated education on rescue breathing techniques with demonstration and practice. Pt required overall minA to complete sniff-breathe and overall maxA verbal/visual  cues for diaphragmatic breathing. SLP provided hand out with instructions for continued home practice. Cough/throat clears were <10 (total) today.    PATIENT EDUCATION: Education details: VCD; chronic cough Person educated: Patient Education method: Explanation Education comprehension: verbalized understanding and needs further education   GOALS: Goals reviewed with patient? Yes  SHORT TERM GOALS: Target date: 02/04/24  Pt will identify (and attempt to reduce) acid reflux triggers  Baseline: Goal status: INITIAL  2.  Pt will verbalize cough substitution strategies  Baseline:  Goal status: INITIAL  3.  Pt will demonstrate rescue breathing techniques with minA Baseline:  Goal status: INITIAL    LONG  TERM GOALS: Target date: 03/06/24  Pt will reduce cough/throat clears to <5 per session  Baseline:  Goal status: INITIAL  2.  Pt will improve score on PROM Baseline:  Goal status: INITIAL    ASSESSMENT:  CLINICAL IMPRESSION: Pt is a 83 yo female who presents to ST OP referred for possible Vocal Cord Dysfunction. Pt endorses she would be talking and she would have a sudden episode where she could not INHALE or EXHALE - I am just stuck. She had one instance of choking a while back and she feels like these episodes are similar. Overall, she has had 3 episodes in the past 3 months. Episodes began with an inhale. SLP observed intermittent dysphonia - with strangled and hoarse vocal quality.  Pt was assessed using PROM measures re: [place] Laryngeal Hypersensitivity Questionnaire, Vocal Cord Dysfunction Questionnaire, and case history. See above for results. Pt coughed and throat cleared for the entirety of today's session. SLP observed pt to cough x12 and throat clear x8 during today's 40 minute session. She reports this started several years ago and then went away. She reported it started up again around 4-6 months ago. Some coughs are productive, though not all. She also reports  globus sensation. Denies any swallowing problems. SLP rec skilled ST services to address rescue breathing techniques, throat clear/cough substitution strategies and acid reflux management .     OBJECTIVE IMPAIRMENTS: include voice disorder. These impairments are limiting patient from effectively communicating at home and in community. Factors affecting potential to achieve goals and functional outcome are NA.SABRA Patient will benefit from skilled SLP services to address above impairments and improve overall function.  REHAB POTENTIAL: Good  PLAN:  SLP FREQUENCY: 1x/week  SLP DURATION: 8 weeks  PLANNED INTERVENTIONS: Environmental controls, Functional tasks, SLP instruction and feedback, Compensatory strategies, Patient/family education, and [id number] Treatment of speech (30 or 45 min)     [employer], CCC-SLP 01/25/2024, 10:23 AM

## 2024-01-26 ENCOUNTER — Institutional Professional Consult (permissible substitution) (INDEPENDENT_AMBULATORY_CARE_PROVIDER_SITE_OTHER): Admitting: Otolaryngology

## 2024-01-30 ENCOUNTER — Ambulatory Visit: Admitting: Speech Pathology

## 2024-02-09 ENCOUNTER — Ambulatory Visit: Admitting: Speech Pathology

## 2024-02-09 ENCOUNTER — Encounter: Payer: Self-pay | Admitting: Speech Pathology

## 2024-02-09 DIAGNOSIS — R498 Other voice and resonance disorders: Secondary | ICD-10-CM | POA: Diagnosis not present

## 2024-02-09 NOTE — Therapy (Signed)
 OUTPATIENT SPEECH LANGUAGE PATHOLOGY VOICE TREATMENT   Patient Name: Monica Diaz MRN: [id number] DOB:1940/10/03, 83 y.o., female Today's Date: 02/09/2024  PCP: Mahlon Comer BRAVO, MD REFERRING PROVIDER: Okey Burns MD  END OF SESSION:  End of Session - 02/09/24 0805     Visit Number 5    Number of Visits 9    Date for Recertification  03/06/24    SLP Start Time 0800    SLP Stop Time  0840    SLP Time Calculation (min) 40 min    Activity Tolerance Patient tolerated treatment well          Past Medical History:  Diagnosis Date   Allergy    Anemia    Anxiety    Arthritis    Breast cancer (HCC) 2015   Cancer (HCC) 2015   breast   GERD (gastroesophageal reflux disease)    Glaucoma    HOH (hard of hearing)    Hx of radiation therapy 07/18/13-08/14/13   right breast total dose 50 Gy   Hyperlipidemia    Neuromuscular disorder (HCC)    Osteoporosis    Personal history of radiation therapy 2015   Seasonal allergies    Sleep apnea    uses a cpap   Thyroid  disease    Wears glasses    Past Surgical History:  Procedure Laterality Date   ABDOMINAL HYSTERECTOMY     fibroids   BREAST BIOPSY Right 2015   core    BREAST BIOPSY Left 06/18/2018   DENSE FIBROSIS WITH CALCIFICATIONS AND CHRONIC   BREAST BIOPSY Left 06/07/2017   BREAST EXCISIONAL BIOPSY Right 2005   BREAST LUMPECTOMY Right    cataract surgery Left 10/04/2023   COLONOSCOPY     LEFT OOPHORECTOMY     PARTIAL MASTECTOMY WITH NEEDLE LOCALIZATION AND AXILLARY SENTINEL LYMPH NODE BX Right 05/28/2013   Procedure: PARTIAL MASTECTOMY WITH NEEDLE LOCALIZATION AND AXILLARY SENTINEL LYMPH NODE BIOPSY;  Surgeon: Elon CHRISTELLA Pacini, MD;  Location: [facility];  Service: General;  Laterality: Right;   Patient Active Problem List   Diagnosis Date Noted   Vocal cord dysfunction 12/12/2023   GERD (gastroesophageal reflux disease) 12/12/2023   Overweight (BMI 25.0-29.9) 10/31/2023   PAD (peripheral  artery disease) 09/22/2022   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   Neuropathy of both feet 03/13/2017   Bilateral hand numbness 02/25/2015   Chronic hand pain 12/08/2014   Sleep behavior disorder, 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   Other neutropenia 05/13/2011   General medical examination 05/13/2011   BUNION 03/25/2010   Hypothyroidism 12/02/2009   Vitamin D  deficiency 02/10/2009   Hyperlipidemia 12/22/2008   Allergic rhinitis 12/22/2008   NECK PAIN 12/22/2008   Osteoporosis 12/22/2008   VERTIGO 12/22/2008    Onset date: referred on 01/02/24  REFERRING DIAG:  Diagnosis  R06.00 (ICD-10-CM) - Dyspnea, unspecified type    THERAPY DIAG:  Other voice and resonance disorders  Rationale for Evaluation and Treatment: Rehabilitation  SUBJECTIVE:   SUBJECTIVE STATEMENT: Quite well  Pt accompanied by: self  PERTINENT HISTORY: Hypothyroid  PAIN:  Are you having pain? No  FALLS: Has patient fallen in last 6 months? 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 intake: suboptimal (4, 8 oz)  - recommending increase intake - add flavoring and/or try straw Caffeine/alcohol intake: 3 large cups of coffee/day; 1-2/week  Daily voice use: minimal; living alone, but talks on the phone to relatives  PERCEPTUAL VOICE ASSESSMENT: Voice quality: hoarse and strained; intermittent Vocal abuse: habitual throat clearing Resonance: normal Respiratory function: thoracic breathing  OBJECTIVE VOICE ASSESSMENT: Maximum phonation time for sustained  ah: - Conversational pitch average: - Hz Conversational pitch range: - Hz Conversational loudness average: - dB Conversational loudness range: - dB S/z ratio: - (Suggestive of dysfunction >1.0)  PATIENT REPORTED OUTCOME MEASURES (PROM): [place] [facility]): 15.9 (73/98 pts) - Total obstruction: 5 - Total pain/thermal: 6.6  -Total throat tickle: 4.3   *Higher scores indicate less bothersome sx  Vocal Cord Dysfunction Questionnaire (VCD-Q): 37/60 *Score 12 and below is considered normal                                                                                                                           TREATMENT DATE:   02/09/24: Pt was seen for skilled ST services targeting suspected VCD. She has not had any suspected VCD episodes. She reports when she starts to feel breathless, she employs her VCD strategies. She has practiced diaphragmatic breathing. She reports when she starts to breathe and it feels shallow - she may begin to feel a little anxious. When she starts her breathing exercises, the shallow feeling goes away.  She reports throat clears/coughing has really improved. Pt reports she has increased water tremendously and has been using sips of water and hard swallow to reduce. Pt is implementing all strategies provided independently. To reduce to x1 next month for check in before certification date is up. We will discuss next steps at that time.   Throat clears: x3 in 40 min session *NO COUGH  01/25/24: Pt was seen for skilled ST services targeting suspected VCD. Pt reports she saw a pulmonologist since she has last been here. They are going to schedule a PFT to r/o other possible causes. She reports her coughing has been less frequent after starting to use the strategies. She attempted hard swallow and sips of water; though these have not totally eliminated episodes of coughing/throat clears.  TC: x6 *no coughing today Continued practice on diaphragmatic breathing. Requiring maxA  verbal and visual cueing while attempting during sitting. Brief education on acid reflux and directed to [employer].   01/17/24: Pt was seen for skilled ST services targeting suspected VCD. Questions about how the breathing exercises work physiologically. Initiated session with diaphragmatic belly breathing - supine was able to achieve vs sitting. She has initiated acid reflux  and post nasal meds.  Cough x 5II  Educated on cough/throat clear substitution strategies. Pt reports she would be most likely to use re: hard swallow and sips of water.    01/10/24: Pt was seen for skilled ST  services targeting suspected VCD. SLP initiated education on rescue breathing techniques with demonstration and practice. Pt required overall minA to complete sniff-breathe and overall maxA verbal/visual cues for diaphragmatic breathing. SLP provided hand out with instructions for continued home practice. Cough/throat clears were <10 (total) today.    PATIENT EDUCATION: Education details: VCD; chronic cough Person educated: Patient Education method: Explanation Education comprehension: verbalized understanding and needs further education   GOALS: Goals reviewed with patient? Yes  SHORT TERM GOALS: Target date: 02/04/24  Pt will identify (and attempt to reduce) acid reflux triggers  Baseline: Goal status: INITIAL  2.  Pt will verbalize cough substitution strategies  Baseline:  Goal status: INITIAL  3.  Pt will demonstrate rescue breathing techniques with minA Baseline:  Goal status: INITIAL    LONG TERM GOALS: Target date: 03/06/24  Pt will reduce cough/throat clears to <5 per session  Baseline:  Goal status: INITIAL  2.  Pt will improve score on PROM Baseline:  Goal status: INITIAL    ASSESSMENT:  CLINICAL IMPRESSION: Pt is a 83 yo female who presents to ST OP referred for possible Vocal Cord Dysfunction. Pt endorses she would be talking and she would have a sudden episode where she  could not INHALE or EXHALE - I am just stuck. She had one instance of choking a while back and she feels like these episodes are similar. Overall, she has had 3 episodes in the past 3 months. Episodes began with an inhale. SLP observed intermittent dysphonia - with strangled and hoarse vocal quality.  Pt was assessed using PROM measures re: [place] Laryngeal Hypersensitivity Questionnaire, Vocal Cord Dysfunction Questionnaire, and case history. See above for results. Pt coughed and throat cleared for the entirety of today's session. SLP observed pt to cough x12 and throat clear x8 during today's 40 minute session. She reports this started several years ago and then went away. She reported it started up again around 4-6 months ago. Some coughs are productive, though not all. She also reports globus sensation. Denies any swallowing problems. SLP rec skilled ST services to address rescue breathing techniques, throat clear/cough substitution strategies and acid reflux management .     OBJECTIVE IMPAIRMENTS: include voice disorder. These impairments are limiting patient from effectively communicating at home and in community. Factors affecting potential to achieve goals and functional outcome are NA.SABRA Patient will benefit from skilled SLP services to address above impairments and improve overall function.  REHAB POTENTIAL: Good  PLAN:  SLP FREQUENCY: 1x/week  SLP DURATION: 8 weeks  PLANNED INTERVENTIONS: Environmental controls, Functional tasks, SLP instruction and feedback, Compensatory strategies, Patient/family education, and 07492 Treatment of speech (30 or 45 min)     [employer], CCC-SLP 02/09/2024, 8:06 AM

## 2024-02-18 DIAGNOSIS — R030 Elevated blood-pressure reading, without diagnosis of hypertension: Secondary | ICD-10-CM | POA: Diagnosis not present

## 2024-02-18 DIAGNOSIS — Z7983 Long term (current) use of bisphosphonates: Secondary | ICD-10-CM | POA: Diagnosis not present

## 2024-02-18 DIAGNOSIS — K219 Gastro-esophageal reflux disease without esophagitis: Secondary | ICD-10-CM | POA: Diagnosis not present

## 2024-02-18 DIAGNOSIS — H53149 Visual discomfort, unspecified: Secondary | ICD-10-CM | POA: Diagnosis not present

## 2024-02-18 DIAGNOSIS — E039 Hypothyroidism, unspecified: Secondary | ICD-10-CM | POA: Diagnosis not present

## 2024-02-18 DIAGNOSIS — Z8249 Family history of ischemic heart disease and other diseases of the circulatory system: Secondary | ICD-10-CM | POA: Diagnosis not present

## 2024-02-18 DIAGNOSIS — J309 Allergic rhinitis, unspecified: Secondary | ICD-10-CM | POA: Diagnosis not present

## 2024-02-18 DIAGNOSIS — N1831 Chronic kidney disease, stage 3a: Secondary | ICD-10-CM | POA: Diagnosis not present

## 2024-02-18 DIAGNOSIS — M8580 Other specified disorders of bone density and structure, unspecified site: Secondary | ICD-10-CM | POA: Diagnosis not present

## 2024-03-05 ENCOUNTER — Encounter: Payer: Self-pay | Admitting: Speech Pathology

## 2024-03-05 ENCOUNTER — Ambulatory Visit: Payer: Self-pay | Attending: Otolaryngology | Admitting: Speech Pathology

## 2024-03-05 DIAGNOSIS — R498 Other voice and resonance disorders: Secondary | ICD-10-CM | POA: Insufficient documentation

## 2024-03-05 NOTE — Therapy (Signed)
 OUTPATIENT SPEECH LANGUAGE PATHOLOGY VOICE TREATMENT & RECERTIFICATION   Patient Name: Monica Diaz MRN: [id number] DOB:1940/10/24, 83 y.o., female Today's Date: 03/05/2024  PCP: Mahlon Comer BRAVO, MD REFERRING PROVIDER: Okey Burns MD  END OF SESSION:  End of Session - 03/05/24 1232     Visit Number 6    Number of Visits --    Date for Recertification  04/26/23    SLP Start Time 1230    SLP Stop Time  1310    SLP Time Calculation (min) 40 min    Activity Tolerance Patient tolerated treatment well          Past Medical History:  Diagnosis Date   Allergy    Anemia    Anxiety    Arthritis    Breast cancer (HCC) 2015   Cancer (HCC) 2015   breast   GERD (gastroesophageal reflux disease)    Glaucoma    HOH (hard of hearing)    Hx of radiation therapy 07/18/13-08/14/13   right breast total dose 50 Gy   Hyperlipidemia    Neuromuscular disorder (HCC)    Osteoporosis    Personal history of radiation therapy 2015   Seasonal allergies    Sleep apnea    uses a cpap   Thyroid  disease    Wears glasses    Past Surgical History:  Procedure Laterality Date   ABDOMINAL HYSTERECTOMY     fibroids   BREAST BIOPSY Right 2015   core    BREAST BIOPSY Left 06/18/2018   DENSE FIBROSIS WITH CALCIFICATIONS AND CHRONIC   BREAST BIOPSY Left 06/07/2017   BREAST EXCISIONAL BIOPSY Right 2005   BREAST LUMPECTOMY Right    cataract surgery Left 10/04/2023   COLONOSCOPY     LEFT OOPHORECTOMY     PARTIAL MASTECTOMY WITH NEEDLE LOCALIZATION AND AXILLARY SENTINEL LYMPH NODE BX Right 05/28/2013   Procedure: PARTIAL MASTECTOMY WITH NEEDLE LOCALIZATION AND AXILLARY SENTINEL LYMPH NODE BIOPSY;  Surgeon: Elon CHRISTELLA Pacini, MD;  Location: [facility];  Service: General;  Laterality: Right;   Patient Active Problem List   Diagnosis Date Noted   Vocal cord dysfunction 12/12/2023   GERD (gastroesophageal reflux disease) 12/12/2023   Overweight (BMI 25.0-29.9) 10/31/2023    PAD (peripheral artery disease) 09/22/2022   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   Neuropathy of both feet 03/13/2017   Bilateral hand numbness 02/25/2015   Chronic hand pain 12/08/2014   Sleep behavior disorder, 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  Pt accompanied by: self  PERTINENT HISTORY: Hypothyroid  PAIN:  Are you having pain? No  FALLS: Has patient fallen in last 6 months? 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 (4, 8 oz)  - recommending increase intake - add flavoring and/or try straw Caffeine/alcohol intake: 3 large cups of coffee/day; 1-2/week  Daily voice use: minimal; living alone, but talks on the phone to relatives  PERCEPTUAL VOICE ASSESSMENT: Voice quality: hoarse and strained; intermittent Vocal abuse: habitual throat clearing Resonance: normal Respiratory function: thoracic breathing  OBJECTIVE  VOICE ASSESSMENT: Maximum phonation time for sustained ah: - Conversational pitch average: - Hz Conversational pitch range: - Hz Conversational loudness average: - dB Conversational loudness range: - dB S/z ratio: - (Suggestive of dysfunction >1.0)  PATIENT REPORTED OUTCOME MEASURES (PROM): [place] [facility]): 15.9 (73/98 pts) - Total obstruction: 5 - Total pain/thermal: 6.6  -Total throat tickle: 4.3   *Higher scores indicate less bothersome sx  Vocal Cord Dysfunction Questionnaire (VCD-Q): 37/60 *Score 12 and below is considered normal                                                                                                                           TREATMENT DATE:   03/05/24: Pt was seen for skilled ST services targeting suspected VCD. She reports she had a suspected VCD episode. She reports she was panicked and jumped up because she didn't want to pass out.  She tried to do the sniff sniff breathe, but wasn't able to do it completely. She reports she grabbed some water and this made it much easier to breathe. She reports it was not one of her worst episodes. She tried to do the sniff sniff breath, but wasn't able to do it completely. She shared she has completely changed her diet to be more conscious of acid reflux triggers. What pt is describing seems more consistent with laryngospasm. SLP suggested the following re: sniff sniff breathe, sip of water. SLP found research on laryngospasm notch - SLP suggested trying this if other strategies don't work.  To recert and place on hold for continued practice of strategies to reduce likelihood of episode occurring.   Throat clear: [id number] Cough: x1   02/09/24: Pt was seen for skilled ST services targeting suspected VCD. She has not had any suspected VCD episodes. She reports when she starts to feel breathless, she employs her VCD strategies. She has practiced diaphragmatic breathing. She reports when she starts to breathe and it feels  shallow - she may begin to feel a little anxious. When she starts her breathing exercises, the shallow feeling goes away.  She reports throat clears/coughing has really improved. Pt reports she has increased water tremendously and has been using sips of water and hard swallow to reduce. Pt is implementing all strategies provided independently. To reduce to x1 next month for check in before certification date is up. We will discuss next steps at that time.   Throat clears: x3 in 40 min session *NO COUGH  01/25/24: Pt was  seen for skilled ST services targeting suspected VCD. Pt reports she saw a pulmonologist since she has last been here. They are going to schedule a PFT to r/o other possible causes. She reports her coughing has been less frequent after starting to use the strategies. She attempted hard swallow and sips of water; though these have not totally eliminated episodes of coughing/throat clears.  TC: x6 *no coughing today Continued practice on diaphragmatic breathing. Requiring maxA verbal and visual cueing while attempting during sitting. Brief education on acid reflux and directed to [employer].   01/17/24: Pt was seen for skilled ST services targeting suspected VCD. Questions about how the breathing exercises work physiologically. Initiated session with diaphragmatic belly breathing - supine was able to achieve vs sitting. She has initiated acid reflux  and post nasal meds.  Cough x 5II  Educated on cough/throat clear substitution strategies. Pt reports she would be most likely to use re: hard swallow and sips of water.    01/10/24: Pt was seen for skilled ST services targeting suspected VCD. SLP initiated education on rescue breathing techniques with demonstration and practice. Pt required overall minA to complete sniff-breathe and overall maxA verbal/visual cues for diaphragmatic breathing. SLP provided hand out with instructions for continued home practice. Cough/throat clears were <10  (total) today.    PATIENT EDUCATION: Education details: VCD; chronic cough Person educated: Patient Education method: Explanation Education comprehension: verbalized understanding and needs further education   GOALS: Goals reviewed with patient? Yes  SHORT TERM GOALS: Target date: 02/04/24  Pt will identify (and attempt to reduce) acid reflux triggers  Baseline: Goal status: MET  2.  Pt will verbalize cough substitution strategies  Baseline:  Goal status: MET  3.  Pt will demonstrate rescue breathing techniques with minA Baseline:  Goal status: MET    LONG TERM GOALS: Target date: 03/06/24; 04/26/23  Pt will reduce cough/throat clears to <5 per session  Baseline:  Goal status: NOT MET; CONT  2.  Pt will improve score on PROM Baseline:  Goal status: NOT MET; CONT    ASSESSMENT:  CLINICAL IMPRESSION: Pt is a 83 yo female who presented to ST OP referred for possible Vocal Cord Dysfunction. To recert for additional 2 visits and be placed on HOLD for month of November to ensure carry over of strategies and check in with success.   @ eval: Pt endorses she would be talking and she would have a sudden episode where she could not INHALE or EXHALE - I am just stuck. She had one instance of choking a while back and she feels like these episodes are similar. Overall, she has had 3 episodes in the past 3 months. Episodes began with an inhale. SLP observed intermittent dysphonia - with strangled and hoarse vocal quality.  Pt was assessed using PROM measures re: [place] Laryngeal Hypersensitivity Questionnaire, Vocal Cord Dysfunction Questionnaire, and case history. See above for results. Pt coughed and throat cleared for the entirety of today's session. SLP observed pt to cough x12 and throat clear x8 during today's 40 minute session. She reports this started several years ago and then went away. She reported it started up again around 4-6 months ago. Some coughs are productive,  though not all. She also reports globus sensation. Denies any swallowing problems. SLP rec skilled ST services to address rescue breathing techniques, throat clear/cough substitution strategies and acid reflux management .     OBJECTIVE IMPAIRMENTS: include voice disorder. These impairments are limiting patient from effectively communicating  at home and in community. Factors affecting potential to achieve goals and functional outcome are NA.[email or web address] Patient will benefit from skilled SLP services to address above impairments and improve overall function.  REHAB POTENTIAL: Good  PLAN:  SLP FREQUENCY: 2  SLP DURATION: 8 weeks  PLANNED INTERVENTIONS: Environmental controls, Functional tasks, SLP instruction and feedback, Compensatory strategies, Patient/family education, and 07492 Treatment of speech (30 or 45 min)     [employer], CCC-SLP 03/05/2024, 12:59 PM

## 2024-03-27 ENCOUNTER — Ambulatory Visit

## 2024-03-27 ENCOUNTER — Ambulatory Visit (HOSPITAL_BASED_OUTPATIENT_CLINIC_OR_DEPARTMENT_OTHER)

## 2024-03-27 VITALS — BP 116/68 | HR 82 | Temp 98.2°F | Ht 64.0 in | Wt 162.6 lb

## 2024-03-27 DIAGNOSIS — R0602 Shortness of breath: Secondary | ICD-10-CM

## 2024-03-27 LAB — PULMONARY FUNCTION TEST
DL/VA % pred: 105 %
DL/VA: 4.29 ml/min/mmHg/L
DLCO unc % pred: 71 %
DLCO unc: 13.35 ml/min/mmHg
FEF 25-75 Post: 3.16 L/s
FEF 25-75 Pre: 2.46 L/s
FEF2575-%Change-Post: 28 %
FEF2575-%Pred-Post: 233 %
FEF2575-%Pred-Pre: 181 %
FEV1-%Change-Post: 8 %
FEV1-%Pred-Post: 102 %
FEV1-%Pred-Pre: 94 %
FEV1-Post: 1.95 L
FEV1-Pre: 1.8 L
FEV1FVC-%Change-Post: 1 %
FEV1FVC-%Pred-Pre: 120 %
FEV6-%Change-Post: 4 %
FEV6-%Pred-Post: 88 %
FEV6-%Pred-Pre: 83 %
FEV6-Post: 2.14 L
FEV6-Pre: 2.04 L
FEV6FVC-%Pred-Post: 105 %
FEV6FVC-%Pred-Pre: 105 %
FVC-%Change-Post: 6 %
FVC-%Pred-Post: 84 %
FVC-%Pred-Pre: 79 %
FVC-Post: 2.17 L
FVC-Pre: 2.04 L
Post FEV1/FVC ratio: 90 %
Post FEV6/FVC ratio: 100 %
Pre FEV1/FVC ratio: 89 %
Pre FEV6/FVC Ratio: 100 %
RV % pred: 79 %
RV: 1.91 L
TLC % pred: 79 %
TLC: 4 L

## 2024-03-27 NOTE — Patient Instructions (Signed)
  VISIT SUMMARY: Today, we discussed your episodes of breathing difficulty, which are likely related to your vocal cord dysfunction. We reviewed your recent symptoms and concerns about potential heart problems, given your family history. We also discussed the results of your pulmonary function tests (PFTs) and the next steps for further evaluation.  YOUR PLAN: -VOCAL CORD DYSFUNCTION: Vocal cord dysfunction is a condition where the vocal cords do not open correctly, causing breathing difficulties. You should continue with your speech therapy exercises. We have also ordered a chest CT scan to rule out other causes of your symptoms and an echocardiogram to check your heart function.  -RESTRICTIVE LUNG DISEASE: Restrictive lung disease is a condition where the lungs cannot expand fully, leading to reduced lung capacity. Your PFTs indicate this condition, and we need to investigate further. We have ordered a chest CT scan to evaluate your lung structure and an echocardiogram to assess your heart function.  INSTRUCTIONS: Please continue with your speech therapy exercises. You will need to complete a chest CT scan and an echocardiogram. Follow up with us  after these tests to discuss the results and next steps.         Contains text generated by [employer].

## 2024-03-27 NOTE — Progress Notes (Signed)
 Full PFT performed today.

## 2024-03-27 NOTE — Patient Instructions (Signed)
 Full PFT performed today.

## 2024-03-27 NOTE — Progress Notes (Signed)
 Subjective:   PATIENT ID: Monica Diaz Miu GENDER: female DOB: 1941/02/28, MRN: [id number]   HPI Discussed the use of AI scribe software for clinical note transcription with the patient, who gave verbal consent to proceed.  History of Present Illness Monica Diaz is an 83 year old female with vocal cord dysfunction who presents with episodes of breathing difficulty.  She experiences episodes where her breath gets caught during inhalation, making it difficult to exhale. These episodes are described as terrifying, leaving her feeling 'stuck and unable to come down.' She has been undergoing speech therapy for her vocal cord dysfunction, but she is uncertain about its effectiveness. During a recent episode, she attempted to alleviate her symptoms by sniffing and blowing out, and eventually found relief by drinking water.  Her breathing can sometimes be shallow, and she struggles to take a deep breath, although she is later able to inhale completely. She has not experienced any episodes since the last one in October.  She expresses concern about potential heart problems, noting a family history of heart issues among her relatives, including aunts and uncles.     Past Medical History:  Diagnosis Date   Allergy    Anemia    Anxiety    Arthritis    Breast cancer (HCC) 2015   Cancer (HCC) 2015   breast   GERD (gastroesophageal reflux disease)    Glaucoma    HOH (hard of hearing)    Hx of radiation therapy 07/18/13-08/14/13   right breast total dose 50 Gy   Hyperlipidemia    Neuromuscular disorder (HCC)    Osteoporosis    Personal history of radiation therapy 2015   Seasonal allergies    Sleep apnea    uses a cpap   Thyroid  disease    Wears glasses      Family History  Problem Relation Age of Onset   Hypertension Mother    Diabetes Maternal Aunt    Stroke Other        grandfather   Cancer Maternal Uncle    Breast cancer Neg Hx      Social History    Socioeconomic History   Marital status: Divorced    Spouse name: Not on file   Number of children: 2   Years of education: Not on file   Highest education level: Doctorate  Occupational History    Employer: RETIRED  Tobacco Use   Smoking status: Never   Smokeless tobacco: Never  Vaping Use   Vaping status: Never Used  Substance and Sexual Activity   Alcohol use: Yes    Comment: occasionally   Drug use: No   Sexual activity: Not Currently  Other Topics Concern   Not on file  Social History Narrative   Right handed, Caffeine 2-3 cups average daily.  Divorced, 2 daughters.     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     Social Drivers of Corporate Investment Banker Strain: Low Risk  (10/26/2023)   Overall Financial Resource Strain (CARDIA)    Difficulty of Paying Living Expenses: Not hard at all  Food Insecurity: No Food Insecurity (10/26/2023)   Hunger Vital Sign    Worried About Running Out of Food in the Last Year: Never true    Ran Out of Food in the Last Year: Never true  Transportation Needs: No Transportation Needs (10/26/2023)   PRAPARE - Administrator, Civil Service (Medical): No    Lack of Transportation (Non-Medical): No  Physical Activity: Insufficiently Active (10/26/2023)   Exercise Vital Sign    Days of Exercise per Week: 2 days    Minutes of Exercise per Session: 30 min  Stress: No Stress Concern Present (10/26/2023)   [employer] of Occupational Health - Occupational Stress Questionnaire    Feeling of Stress: Not at all  Social Connections: Moderately Integrated (10/26/2023)   Social Connection and Isolation Panel    Frequency of Communication with Friends and Family: Three times a week    Frequency of Social Gatherings with Friends and Family: Once a week    Attends Religious Services: More than 4 times per year    Active Member of [employer] or Organizations: Yes    Attends Engineer, Structural: More than 4 times per year    Marital Status:  Divorced  Intimate Partner Violence: Not At Risk (09/28/2022)   Humiliation, Afraid, Rape, and Kick questionnaire    Fear of Current or Ex-Partner: No    Emotionally Abused: No    Physically Abused: No    Sexually Abused: No     Allergies  Allergen Reactions   Codeine     REACTION: sick to stomach     Outpatient Medications Prior to Visit  Medication Sig Dispense Refill   alendronate  (FOSAMAX ) 70 MG tablet TAKE 1 TABLET BY MOUTH 1 TIME EVERY WEEK WITH A FULL GLASS OF WATER AND ON AN EMPTY STOMACH 12 tablet 0   calcium  citrate-vitamin D  (CITRACAL+D) 315-200 MG-UNIT per tablet Take 1 tablet by mouth 2 (two) times daily.     Coenzyme Q10 (COQ10) 200 MG CAPS Take 1 capsule by mouth at bedtime.     esomeprazole  (NEXIUM ) 40 MG capsule Take 1 capsule (40 mg total) by mouth daily. 30 capsule 3   fluticasone  (FLONASE ) 50 MCG/ACT nasal spray Place 2 sprays into both nostrils 2 (two) times daily. 16 g 6   hydroxypropyl methylcellulose / hypromellose (ISOPTO TEARS / GONIOVISC) 2.5 % ophthalmic solution Place 1 drop into both eyes as needed for dry eyes.     levocetirizine (XYZAL  ALLERGY 24HR) 5 MG tablet Take 1 tablet (5 mg total) by mouth every evening. 30 tablet 3   levothyroxine  (SYNTHROID ) 100 MCG tablet TAKE 1 TABLET(100 MCG) BY MOUTH DAILY 30 tablet 4   MAGNESIUM  CITRATE PO Take 250 mg by mouth daily.     pyridoxine (B-6) 100 MG tablet Take 100 mg by mouth 2 (two) times daily.     Red Yeast Rice Extract ([employer] RED YEAST RICE) 600 MG CAPS Take 2 capsules (1,200 mg total) by mouth 2 (two) times daily.  0   vitamin B-12 (CYANOCOBALAMIN) 50 MCG tablet Take 50 mcg by mouth.     vitamin C (ASCORBIC ACID) 500 MG tablet Take 500 mg by mouth 2 (two) times daily.     Zinc 50 MG TABS Take 50 mg by mouth daily.     moxifloxacin (VIGAMOX) 0.5 % ophthalmic solution Place 1 drop into the right eye 4 (four) times daily.     Multiple Vitamins-Minerals (ZINC PO) Take by mouth. (Patient not taking: Reported on  03/27/2024)     No facility-administered medications prior to visit.    ROS Reviewed all systems and reported negative except as above     Objective:   Vitals:   03/27/24 1432  BP: 116/68  Pulse: 82  Temp: 98.2 F (36.8 C)  TempSrc: Oral  SpO2: 99%  Weight: 162 lb 9.6 oz (73.8 kg)  Height: 5' 4 (  1.626 m)    Physical Exam Physical Exam GENERAL: Appropriate to age, no acute distress. HEAD EYES EARS NOSE THROAT: Moist mucous membranes, atraumatic, normocephalic. CHEST: Clear to auscultation bilaterally, no wheezing, no crackles, no rales. CARDIAC: Regular rate and rhythm, normal S1, normal S2, no murmurs, no rubs, no gallops. ABDOMEN: Soft, nontender. NEUROLOGICAL: Motor and sensation grossly intact, alert and oriented times X 3. EXTREMITIES: Warm, well perfused, no edema. NECK: Supple, no masses.     CBC    Component Value Date/Time   WBC 4.3 10/31/2023 1030   RBC 3.88 10/31/2023 1030   HGB 11.5 (L) 10/31/2023 1030   HGB 11.4 (L) 08/04/2022 1250   HGB 11.8 06/16/2016 1300   HCT 35.4 (L) 10/31/2023 1030   HCT 36.3 06/16/2016 1300   PLT 236.0 10/31/2023 1030   PLT 219 08/04/2022 1250   PLT 235 06/16/2016 1300   MCV 91.2 10/31/2023 1030   MCV 88.8 06/16/2016 1300   MCH 30.9 08/04/2022 1250   MCHC 32.6 10/31/2023 1030   RDW 15.2 10/31/2023 1030   RDW 15.1 (H) 06/16/2016 1300   LYMPHSABS 1.7 10/31/2023 1030   LYMPHSABS 2.0 06/16/2016 1300   MONOABS 0.5 10/31/2023 1030   MONOABS 0.5 06/16/2016 1300   EOSABS 0.1 10/31/2023 1030   EOSABS 0.1 06/16/2016 1300   BASOSABS 0.0 10/31/2023 1030   BASOSABS 0.0 06/16/2016 1300      PFT:    Latest Ref Rng & Units 03/27/2024   12:41 PM  PFT Results  FVC-Pre L 2.04   FVC-Predicted Pre % 79   FVC-Post L 2.17   FVC-Predicted Post % 84   Pre FEV1/FVC % % 89   Post FEV1/FCV % % 90   FEV1-Pre L 1.80   FEV1-Predicted Pre % 94   FEV1-Post L 1.95   DLCO uncorrected ml/min/mmHg 13.35   DLCO UNC% % 71   DLVA  Predicted % 105   TLC L 4.00   TLC % Predicted % 79   RV % Predicted % 79         Assessment & Plan:   Assessment and Plan Assessment & Plan Vocal cord dysfunction Episodes of dyspnea with difficulty exhaling, likely due to vocal cord dysfunction. Symptoms have not recurred since October. PFTs show reduced exhalation and diffusion capacity, possibly related to vocal cord dysfunction. Differential includes cardiac issues. - Continue speech therapy exercises. - Ordered chest CT to rule out other causes of restrictive lung disease. - Ordered echocardiogram to assess cardiac function.  Restrictive lung disease PFTs indicate restrictive lung disease with reduced FVC at 79% of predicted and diffusion capacity at 71% of predicted. Differential includes vocal cord dysfunction and potential cardiac issues. - Ordered chest CT to evaluate lung structure and rule out other causes. - Ordered echocardiogram to assess cardiac function.        Zola Herter, MD [facility] Office: [phone]

## 2024-04-01 ENCOUNTER — Ambulatory Visit (HOSPITAL_COMMUNITY): Admission: RE | Admit: 2024-04-01 | Discharge: 2024-04-01

## 2024-04-01 DIAGNOSIS — E785 Hyperlipidemia, unspecified: Secondary | ICD-10-CM | POA: Diagnosis not present

## 2024-04-01 DIAGNOSIS — R0602 Shortness of breath: Secondary | ICD-10-CM

## 2024-04-01 LAB — ECHOCARDIOGRAM COMPLETE
Area-P 1/2: 2.88 cm2
Calc EF: 63.2 %
S' Lateral: 1.7 cm
Single Plane A2C EF: 60.1 %
Single Plane A4C EF: 67.2 %

## 2024-04-03 ENCOUNTER — Other Ambulatory Visit: Payer: Self-pay | Admitting: Student in an Organized Health Care Education/Training Program

## 2024-04-03 ENCOUNTER — Other Ambulatory Visit: Payer: Self-pay | Admitting: Family Medicine

## 2024-04-03 DIAGNOSIS — M8000XG Age-related osteoporosis with current pathological fracture, unspecified site, subsequent encounter for fracture with delayed healing: Secondary | ICD-10-CM

## 2024-04-03 DIAGNOSIS — K219 Gastro-esophageal reflux disease without esophagitis: Secondary | ICD-10-CM

## 2024-04-03 NOTE — Telephone Encounter (Signed)
 Okay to refill?

## 2024-04-08 ENCOUNTER — Inpatient Hospital Stay: Admission: RE | Admit: 2024-04-08 | Discharge: 2024-04-08

## 2024-04-08 DIAGNOSIS — R0602 Shortness of breath: Secondary | ICD-10-CM

## 2024-04-09 ENCOUNTER — Ambulatory Visit

## 2024-04-09 VITALS — Ht 64.0 in | Wt 162.0 lb

## 2024-04-09 DIAGNOSIS — Z0000 Encounter for general adult medical examination without abnormal findings: Secondary | ICD-10-CM

## 2024-04-09 NOTE — Progress Notes (Signed)
 Chief Complaint  Patient presents with   Medicare Wellness     Subjective:   Monica Diaz is a 83 y.o. female who presents for a Medicare Annual Wellness    Patient advised to keep follow-up appointment with PCP (05-08-2023 @ 10:00 )         Patient stating having some constipation issues has  been taking miralax , no pain or blood in stool.  Will monitor until appointment will call if any change.  Visit info / Clinical Intake: Medicare Wellness Visit Type:: Subsequent Annual Wellness Visit Persons participating in visit and providing information:: patient Medicare Wellness Visit Mode:: Telephone If telephone:: video declined Since this visit was completed virtually, some vitals may be partially provided or unavailable. Missing vitals are due to the limitations of the virtual format.: Unable to obtain vitals - no equipment If Telephone or Video please confirm:: I connected with patient using audio/video enable telemedicine. I verified patient identity with two identifiers, discussed telehealth limitations, and patient agreed to proceed. Patient Location:: home Provider Location:: home Interpreter Needed?: No Pre-visit prep was completed: no AWV questionnaire completed by patient prior to visit?: yes Date:: 04/08/24 Living arrangements:: (!) lives alone Patient's Overall Health Status Rating: very good Typical amount of pain: some Does pain affect daily life?: no Are you currently prescribed opioids?: no  Dietary Habits and Nutritional Risks How many meals a day?: 2 Eats fruit and vegetables daily?: (!) no Most meals are obtained by: preparing own meals In the last 2 weeks, have you had any of the following?: none Diabetic:: no  Functional Status Activities of Daily Living (to include ambulation/medication): Independent Ambulation: Independent Medication Administration: Independent Home Management (perform basic housework or laundry): Independent Manage your own  finances?: yes Primary transportation is: driving Concerns about vision?: no *vision screening is required for WTM* Concerns about hearing?: no  Fall Screening Falls in the past year?: 1 Number of falls in past year: 0 Was there an injury with Fall?: 0 Fall Risk Category Calculator: 1 Patient Fall Risk Level: Low Fall Risk  Fall Risk Patient at Risk for Falls Due to: No Fall Risks Fall risk Follow up: Falls evaluation completed; Education provided; Falls prevention discussed  Home and Transportation Safety: All rugs have non-skid backing?: (!) no All stairs or steps have railings?: yes Grab bars in the bathtub or shower?: yes Have non-skid surface in bathtub or shower?: (!) no Good home lighting?: yes Regular seat belt use?: yes Hospital stays in the last year:: no  Cognitive Assessment Difficulty concentrating, remembering, or making decisions? : no Will 6CIT or Mini Cog be Completed: yes What year is it?: 0 points What month is it?: 0 points Give patient an address phrase to remember (5 components): Its very sunny outside today in December About what time is it?: 0 points Count backwards from 20 to 1: 0 points Say the months of the year in reverse: 0 points Repeat the address phrase from earlier: 0 points 6 CIT Score: 0 points  Advance Directives (For Healthcare) Does Patient Have a Medical Advance Directive?: No Would patient like information on creating a medical advance directive?: No - Patient declined  Reviewed/Updated  Reviewed/Updated: Reviewed All (Medical, Surgical, Family, Medications, Allergies, Care Teams, Patient Goals); Surgical History; Family History; Medications; Allergies; Care Teams; Patient Goals; Medical History    Allergies (verified) Codeine   Current Medications (verified) Outpatient Encounter Medications as of 04/09/2024  Medication Sig   alendronate  (FOSAMAX ) 70 MG tablet TAKE 1  TABLET BY MOUTH 1 TIME EVERY WEEK WITH A FULL GLASS OF  WATER AND ON AN EMPTY STOMACH   calcium  citrate-vitamin D  (CITRACAL+D) 315-200 MG-UNIT per tablet Take 1 tablet by mouth 2 (two) times daily.   Coenzyme Q10 (COQ10) 200 MG CAPS Take 1 capsule by mouth at bedtime.   esomeprazole  (NEXIUM ) 40 MG capsule TAKE 1 CAPSULE(40 MG) BY MOUTH DAILY   fluticasone  (FLONASE ) 50 MCG/ACT nasal spray Place 2 sprays into both nostrils 2 (two) times daily.   hydroxypropyl methylcellulose / hypromellose (ISOPTO TEARS / GONIOVISC) 2.5 % ophthalmic solution Place 1 drop into both eyes as needed for dry eyes.   levocetirizine (XYZAL  ALLERGY 24HR) 5 MG tablet Take 1 tablet (5 mg total) by mouth every evening.   levothyroxine  (SYNTHROID ) 100 MCG tablet TAKE 1 TABLET(100 MCG) BY MOUTH DAILY   MAGNESIUM  CITRATE PO Take 250 mg by mouth daily.   Multiple Vitamins-Minerals (ZINC PO) Take by mouth.   pyridoxine (B-6) 100 MG tablet Take 100 mg by mouth 2 (two) times daily.   Red Yeast Rice Extract (CVS RED YEAST RICE) 600 MG CAPS Take 2 capsules (1,200 mg total) by mouth 2 (two) times daily.   vitamin B-12 (CYANOCOBALAMIN ) 50 MCG tablet Take 50 mcg by mouth.   vitamin C (ASCORBIC ACID) 500 MG tablet Take 500 mg by mouth 2 (two) times daily.   Zinc 50 MG TABS Take 50 mg by mouth daily.   moxifloxacin (VIGAMOX) 0.5 % ophthalmic solution Place 1 drop into the right eye 4 (four) times daily.   No facility-administered encounter medications on file as of 04/09/2024.    History: Past Medical History:  Diagnosis Date   Allergy    Anemia    Anxiety    Arthritis    Breast cancer (HCC) 2015   Cancer (HCC) 2015   breast   GERD (gastroesophageal reflux disease)    Glaucoma    HOH (hard of hearing)    Hx of radiation therapy 07/18/13-08/14/13   right breast total dose 50 Gy   Hyperlipidemia    Neuromuscular disorder (HCC)    Osteoporosis    Personal history of radiation therapy 2015   Seasonal allergies    Sleep apnea    uses a cpap   Thyroid  disease    Wears glasses     Past Surgical History:  Procedure Laterality Date   ABDOMINAL HYSTERECTOMY     fibroids   BREAST BIOPSY Right 2015   core    BREAST BIOPSY Left 06/18/2018   DENSE FIBROSIS WITH CALCIFICATIONS AND CHRONIC   BREAST BIOPSY Left 06/07/2017   BREAST EXCISIONAL BIOPSY Right 2005   BREAST LUMPECTOMY Right    cataract surgery Left 10/04/2023   COLONOSCOPY     LEFT OOPHORECTOMY     PARTIAL MASTECTOMY WITH NEEDLE LOCALIZATION AND AXILLARY SENTINEL LYMPH NODE BX Right 05/28/2013   Procedure: PARTIAL MASTECTOMY WITH NEEDLE LOCALIZATION AND AXILLARY SENTINEL LYMPH NODE BIOPSY;  Surgeon: Elon CHRISTELLA Pacini, MD;  Location: [facility];  Service: General;  Laterality: Right;   Family History  Problem Relation Age of Onset   Hypertension Mother    Diabetes Maternal Aunt    Stroke Other        grandfather   Cancer Maternal Uncle    Breast cancer Neg Hx    Social History   Occupational History    Employer: RETIRED  Tobacco Use   Smoking status: Never   Smokeless tobacco: Never  Vaping Use  Vaping status: Never Used  Substance and Sexual Activity   Alcohol use: Yes    Comment: occasionally   Drug use: No   Sexual activity: Not Currently   Tobacco Counseling Counseling given: Not Answered  SDOH Screenings   Food Insecurity: No Food Insecurity (04/09/2024)  Housing: Low Risk (04/09/2024)  Transportation Needs: No Transportation Needs (04/09/2024)  Utilities: Not At Risk (04/09/2024)  Alcohol Screen: Low Risk (04/08/2024)  Depression (PHQ2-9): Low Risk (04/09/2024)  Financial Resource Strain: Low Risk (04/08/2024)  Physical Activity: Insufficiently Active (04/09/2024)  Social Connections: Moderately Integrated (04/09/2024)  Stress: No Stress Concern Present (04/09/2024)  Tobacco Use: Low Risk (04/09/2024)  Health Literacy: Adequate Health Literacy (04/09/2024)   See flowsheets for full screening details  Depression Screen PHQ 2 & 9 Depression Scale- Over the  past 2 weeks, how often have you been bothered by any of the following problems? Little interest or pleasure in doing things: 0 Feeling down, depressed, or hopeless (PHQ Adolescent also includes...irritable): 0 PHQ-2 Total Score: 0 Trouble falling or staying asleep, or sleeping too much: 0 Feeling tired or having little energy: 0 Poor appetite or overeating (PHQ Adolescent also includes...weight loss): 0 Feeling bad about yourself - or that you are a failure or have let yourself or your family down: 0 Trouble concentrating on things, such as reading the newspaper or watching television (PHQ Adolescent also includes...like school work): 0 Moving or speaking so slowly that other people could have noticed. Or the opposite - being so fidgety or restless that you have been moving around a lot more than usual: 0 Thoughts that you would be better off dead, or of hurting yourself in some way: 0 PHQ-9 Total Score: 0 If you checked off any problems, how difficult have these problems made it for you to do your work, take care of things at home, or get along with other people?: Not difficult at all     Goals Addressed   None          Objective:    Today's Vitals   04/09/24 0937  Weight: 162 lb (73.5 kg)  Height: 5' 4 (1.626 m)   Body mass index is 27.81 kg/m.  Hearing/Vision screen Hearing Screening - Comments:: Bilateral hearing aids Vision Screening - Comments:: [place] up to date Immunizations and Health Maintenance Health Maintenance  Topic Date Due   COVID-19 Vaccine (7 - 2025-26 season) 12/25/2023   DTaP/Tdap/Td (2 - Tdap) 03/18/2024   Mammogram  06/25/2024   Medicare Annual Wellness (AWV)  04/09/2025   Pneumococcal Vaccine: 50+ Years  Completed   Influenza Vaccine  Completed   Bone Density Scan  Completed   Zoster Vaccines- Shingrix  Completed   Meningococcal B Vaccine  Aged Out        Assessment/Plan:  This is a routine wellness examination for Monica Diaz.  Patient  Care Team: Mahlon Comer BRAVO, MD as PCP - General Dohmeier, Dedra, MD as Consulting Physician (Neurology) Kristie Lamprey, MD as Consulting Physician (Gastroenterology) Camella Fallow, MD as Consulting Physician (Orthopedic Surgery) Lavonia Lye, MD as Consulting Physician (Ophthalmology)  I have personally reviewed and noted the following in the patients chart:   Medical and social history Use of alcohol, tobacco or illicit drugs  Current medications and supplements including opioid prescriptions. Functional ability and status Nutritional status Physical activity Advanced directives List of other physicians Hospitalizations, surgeries, and ER visits in previous 12 months Vitals Screenings to include cognitive, depression, and falls Referrals and appointments  No orders of  the defined types were placed in this encounter.  In addition, I have reviewed and discussed with patient certain preventive protocols, quality metrics, and best practice recommendations. A written personalized care plan for preventive services as well as general preventive health recommendations were provided to patient.   Mliss Graff, LPN   [id number]   Return in 1 year (on 04/09/2025).  After Visit Summary: (MyChart) Due to this being a telephonic visit, the after visit summary with patients personalized plan was offered to patient via MyChart   Nurse Notes:

## 2024-04-09 NOTE — Patient Instructions (Addendum)
 Monica Diaz,  Thank you for taking the time for your Medicare Wellness Visit. I appreciate your continued commitment to your health goals. Please review the care plan we discussed, and feel free to reach out if I can assist you further.  Please note that Annual Wellness Visits do not include a physical exam. Some assessments may be limited, especially if the visit was conducted virtually. If needed, we may recommend an in-person follow-up with your provider.  Ongoing Care Seeing your primary care provider every 3 to 6 months helps us  monitor your health and provide consistent, personalized care.  Referrals If a referral was made during today's visit and you haven't received any updates within two weeks, please contact the referred provider directly to check on the status.  Recommended Screenings:  Health Maintenance  Topic Date Due   COVID-19 Vaccine (7 - 2025-26 season) 12/25/2023   DTaP/Tdap/Td vaccine (2 - Tdap) 03/18/2024   Breast Cancer Screening  06/25/2024   Medicare Annual Wellness Visit  04/09/2025   Pneumococcal Vaccine for age over 73  Completed   Flu Shot  Completed   Osteoporosis screening with Bone Density Scan  Completed   Zoster (Shingles) Vaccine  Completed   Meningitis B Vaccine  Aged Out       04/09/2024    9:39 AM  Advanced Directives  Does Patient Have a Medical Advance Directive? No  Would patient like information on creating a medical advance directive? No - Patient declined    Vision: Annual vision screenings are recommended for early detection of glaucoma, cataracts, and diabetic retinopathy. These exams can also reveal signs of chronic conditions such as diabetes and high blood pressure.  Dental: Annual dental screenings help detect early signs of oral cancer, gum disease, and other conditions linked to overall health, including heart disease and diabetes.  Please see the attached documents for additional preventive care recommendations.    Monica Diaz , Thank you for taking time to come for your Medicare Wellness Visit. I appreciate your ongoing commitment to your health goals. Please review the following plan we discussed and let me know if I can assist you in the future.   Screening recommendations/referrals: Colonoscopy:  Mammogram:  Bone Density:  Recommended yearly ophthalmology/optometry visit for glaucoma screening and checkup Recommended yearly dental visit for hygiene and checkup  Vaccinations: Influenza vaccine:  Pneumococcal vaccine:  Tdap vaccine:  Shingles vaccine:         Preventive Care 65 Years and Older, Female Preventive care refers to lifestyle choices and visits with your health care provider that can promote health and wellness. What does preventive care include? A yearly physical exam. This is also called an annual well check. Dental exams once or twice a year. Routine eye exams. Ask your health care provider how often you should have your eyes checked. Personal lifestyle choices, including: Daily care of your teeth and gums. Regular physical activity. Eating a healthy diet. Avoiding tobacco and drug use. Limiting alcohol use. Practicing safe sex. Taking low-dose aspirin every day. Taking vitamin and mineral supplements as recommended by your health care provider. What happens during an annual well check? The services and screenings done by your health care provider during your annual well check will depend on your age, overall health, lifestyle risk factors, and family history of disease. Counseling  Your health care provider may ask you questions about your: Alcohol use. Tobacco use. Drug use. Emotional well-being. Home and relationship well-being. Sexual activity. Eating habits. History of falls.  Memory and ability to understand (cognition). Work and work astronomer. Reproductive health. Screening  You may have the following tests or measurements: Height, weight, and BMI. Blood  pressure. Lipid and cholesterol levels. These may be checked every 5 years, or more frequently if you are over 53 years old. Skin check. Lung cancer screening. You may have this screening every year starting at age 80 if you have a 30-pack-year history of smoking and currently smoke or have quit within the past 15 years. Fecal occult blood test (FOBT) of the stool. You may have this test every year starting at age 46. Flexible sigmoidoscopy or colonoscopy. You may have a sigmoidoscopy every 5 years or a colonoscopy every 10 years starting at age 36. Hepatitis C blood test. Hepatitis B blood test. Sexually transmitted disease (STD) testing. Diabetes screening. This is done by checking your blood sugar (glucose) after you have not eaten for a while (fasting). You may have this done every 1-3 years. Bone density scan. This is done to screen for osteoporosis. You may have this done starting at age 70. Mammogram. This may be done every 1-2 years. Talk to your health care provider about how often you should have regular mammograms. Talk with your health care provider about your test results, treatment options, and if necessary, the need for more tests. Vaccines  Your health care provider may recommend certain vaccines, such as: Influenza vaccine. This is recommended every year. Tetanus, diphtheria, and acellular pertussis (Tdap, Td) vaccine. You may need a Td booster every 10 years. Zoster vaccine. You may need this after age 6. Pneumococcal 13-valent conjugate (PCV13) vaccine. One dose is recommended after age 68. Pneumococcal polysaccharide (PPSV23) vaccine. One dose is recommended after age 50. Talk to your health care provider about which screenings and vaccines you need and how often you need them. This information is not intended to replace advice given to you by your health care provider. Make sure you discuss any questions you have with your health care provider. Document Released:  05/08/2015 Document Revised: 12/30/2015 Document Reviewed: 02/10/2015 Elsevier Interactive Patient Education  2017 [employer].  Fall Prevention in the Home Falls can cause injuries. They can happen to people of all ages. There are many things you can do to make your home safe and to help prevent falls. What can I do on the outside of my home? Regularly fix the edges of walkways and driveways and fix any cracks. Remove anything that might make you trip as you walk through a door, such as a raised step or threshold. Trim any bushes or trees on the path to your home. Use bright outdoor lighting. Clear any walking paths of anything that might make someone trip, such as rocks or tools. Regularly check to see if handrails are loose or broken. Make sure that both sides of any steps have handrails. Any raised decks and porches should have guardrails on the edges. Have any leaves, snow, or ice cleared regularly. Use sand or salt on walking paths during winter. Clean up any spills in your garage right away. This includes oil or grease spills. What can I do in the bathroom? Use night lights. Install grab bars by the toilet and in the tub and shower. Do not use towel bars as grab bars. Use non-skid mats or decals in the tub or shower. If you need to sit down in the shower, use a plastic, non-slip stool. Keep the floor dry. Clean up any water that spills on the floor  as soon as it happens. Remove soap buildup in the tub or shower regularly. Attach bath mats securely with double-sided non-slip rug tape. Do not have throw rugs and other things on the floor that can make you trip. What can I do in the bedroom? Use night lights. Make sure that you have a light by your bed that is easy to reach. Do not use any sheets or blankets that are too big for your bed. They should not hang down onto the floor. Have a firm chair that has side arms. You can use this for support while you get dressed. Do not have  throw rugs and other things on the floor that can make you trip. What can I do in the kitchen? Clean up any spills right away. Avoid walking on wet floors. Keep items that you use a lot in easy-to-reach places. If you need to reach something above you, use a strong step stool that has a grab bar. Keep electrical cords out of the way. Do not use floor polish or wax that makes floors slippery. If you must use wax, use non-skid floor wax. Do not have throw rugs and other things on the floor that can make you trip. What can I do with my stairs? Do not leave any items on the stairs. Make sure that there are handrails on both sides of the stairs and use them. Fix handrails that are broken or loose. Make sure that handrails are as long as the stairways. Check any carpeting to make sure that it is firmly attached to the stairs. Fix any carpet that is loose or worn. Avoid having throw rugs at the top or bottom of the stairs. If you do have throw rugs, attach them to the floor with carpet tape. Make sure that you have a light switch at the top of the stairs and the bottom of the stairs. If you do not have them, ask someone to add them for you. What else can I do to help prevent falls? Wear shoes that: Do not have high heels. Have rubber bottoms. Are comfortable and fit you well. Are closed at the toe. Do not wear sandals. If you use a stepladder: Make sure that it is fully opened. Do not climb a closed stepladder. Make sure that both sides of the stepladder are locked into place. Ask someone to hold it for you, if possible. Clearly mark and make sure that you can see: Any grab bars or handrails. First and last steps. Where the edge of each step is. Use tools that help you move around (mobility aids) if they are needed. These include: Canes. Walkers. Scooters. Crutches. Turn on the lights when you go into a dark area. Replace any light bulbs as soon as they burn out. Set up your furniture so  you have a clear path. Avoid moving your furniture around. If any of your floors are uneven, fix them. If there are any pets around you, be aware of where they are. Review your medicines with your doctor. Some medicines can make you feel dizzy. This can increase your chance of falling. Ask your doctor what other things that you can do to help prevent falls. This information is not intended to replace advice given to you by your health care provider. Make sure you discuss any questions you have with your health care provider. Document Released: 02/05/2009 Document Revised: 09/17/2015 Document Reviewed: 05/16/2014 Elsevier Interactive Patient Education  2017 [employer].

## 2024-04-10 ENCOUNTER — Ambulatory Visit: Payer: Self-pay | Admitting: Speech Pathology

## 2024-04-15 ENCOUNTER — Ambulatory Visit: Admitting: Internal Medicine

## 2024-04-22 ENCOUNTER — Ambulatory Visit: Payer: Self-pay | Attending: Otolaryngology | Admitting: Speech Pathology

## 2024-04-22 ENCOUNTER — Encounter: Payer: Self-pay | Admitting: Speech Pathology

## 2024-04-22 DIAGNOSIS — R498 Other voice and resonance disorders: Secondary | ICD-10-CM | POA: Insufficient documentation

## 2024-04-22 NOTE — Therapy (Signed)
 " OUTPATIENT SPEECH LANGUAGE PATHOLOGY VOICE TREATMENT & DISCHARGE SUMMARY   Patient Name: Monica Diaz MRN: [id number] DOB:1940/08/25, 83 y.o., female Today's Date: 04/22/2024  PCP: Mahlon Comer BRAVO, MD REFERRING PROVIDER: Okey Burns MD  SPEECH THERAPY DISCHARGE SUMMARY  Visits from Start of Care: 7  Current functional level related to goals / functional outcomes: Has met all goals and has seem some improvement.    Remaining deficits: Suspected VCD   Education / Equipment: Completed    Patient agrees to discharge. Patient goals were met. Patient is being discharged due to meeting the stated rehab goals..     END OF SESSION:  End of Session - 04/22/24 1105     Visit Number 7    Date for Recertification  04/26/23    SLP Start Time 1100    SLP Stop Time  1138    SLP Time Calculation (min) 38 min    Activity Tolerance Patient tolerated treatment well          Past Medical History:  Diagnosis Date   Allergy    Anemia    Anxiety    Arthritis    Breast cancer (HCC) 2015   Cancer (HCC) 2015   breast   GERD (gastroesophageal reflux disease)    Glaucoma    HOH (hard of hearing)    Hx of radiation therapy 07/18/13-08/14/13   right breast total dose 50 Gy   Hyperlipidemia    Neuromuscular disorder (HCC)    Osteoporosis    Personal history of radiation therapy 2015   Seasonal allergies    Sleep apnea    uses a cpap   Thyroid  disease    Wears glasses    Past Surgical History:  Procedure Laterality Date   ABDOMINAL HYSTERECTOMY     fibroids   BREAST BIOPSY Right 2015   core    BREAST BIOPSY Left 06/18/2018   DENSE FIBROSIS WITH CALCIFICATIONS AND CHRONIC   BREAST BIOPSY Left 06/07/2017   BREAST EXCISIONAL BIOPSY Right 2005   BREAST LUMPECTOMY Right    cataract surgery Left 10/04/2023   COLONOSCOPY     LEFT OOPHORECTOMY     PARTIAL MASTECTOMY WITH NEEDLE LOCALIZATION AND AXILLARY SENTINEL LYMPH NODE BX Right 05/28/2013   Procedure:  PARTIAL MASTECTOMY WITH NEEDLE LOCALIZATION AND AXILLARY SENTINEL LYMPH NODE BIOPSY;  Surgeon: Elon CHRISTELLA Pacini, MD;  Location: [facility];  Service: General;  Laterality: Right;   Patient Active Problem List   Diagnosis Date Noted   Vocal cord dysfunction 12/12/2023   GERD (gastroesophageal reflux disease) 12/12/2023   Overweight (BMI 25.0-29.9) 10/31/2023   PAD (peripheral artery disease) 09/22/2022   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   Neuropathy of both feet 03/13/2017   Bilateral hand numbness 02/25/2015   Chronic hand pain 12/08/2014   Sleep behavior disorder, 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   Other neutropenia 05/13/2011   General medical examination 05/13/2011   BUNION 03/25/2010   Hypothyroidism 12/02/2009   Vitamin D  deficiency 02/10/2009   Hyperlipidemia 12/22/2008   Allergic rhinitis 12/22/2008   NECK PAIN 12/22/2008   Osteoporosis 12/22/2008   VERTIGO 12/22/2008    Onset date: referred on 01/02/24  REFERRING DIAG:  Diagnosis  R06.00 (ICD-10-CM) - Dyspnea, unspecified type    THERAPY DIAG:  Other voice and resonance disorders  Rationale for Evaluation and Treatment: Rehabilitation  SUBJECTIVE:   SUBJECTIVE STATEMENT: No instances of breathing difficulty since she was last year.   Pt accompanied by: self  PERTINENT HISTORY: Hypothyroid  PAIN:  Are you  having pain? No  FALLS: Has patient fallen in last 6 months? 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 (4, 8 oz)  -  recommending increase intake - add flavoring and/or try straw Caffeine/alcohol intake: 3 large cups of coffee/day; 1-2/week  Daily voice use: minimal; living alone, but talks on the phone to relatives  PERCEPTUAL VOICE ASSESSMENT: Voice quality: hoarse and strained; intermittent Vocal abuse: habitual throat clearing Resonance: normal Respiratory function: thoracic breathing  OBJECTIVE VOICE ASSESSMENT: Maximum phonation time for sustained ah: - Conversational pitch average: - Hz Conversational pitch range: - Hz Conversational loudness average: - dB Conversational loudness range: - dB S/z ratio: - (Suggestive of dysfunction >1.0)  PATIENT REPORTED OUTCOME MEASURES (PROM): @ eval: [facility]): 15.9 (73/98 pts) - Total obstruction: 5 - Total pain/thermal: 6.6  -Total throat tickle: 4.3    @ d/c: 87/98   *Higher scores indicate less bothersome sx   Vocal Cord Dysfunction Questionnaire (VCD-Q): 37/60 *Score 12 and below is considered normal                                                                                                                           TREATMENT DATE:   04/22/24: Pt was seen for skilled ST services targeting follow up before discharge. Pt is being followed by pulmonology. Coughing was worse this last week; however, pt was in [facility] which could have been more triggering for her. Prior to trip, pt reports reduction in cough and throat clearing. She has had no episodes  since she last was here. Pt reports she continues to practice the sniff-sniff breathe and blow out. SLP readministered [facility] PROM. Not  readministering VCD-Q as sx are the same when patient has an episode. Pt will continue to follow up with pulmonologist and cardiologist if needed. She meets with her PCP on 1/13 to discuss next steps and review results of testing. At this time, pt is able to complete necessary rescue breathing techniques and has reduced chronic cough/throat clear. Pt  and therapist in agreement with d/c.   Cough x111  Throat clearing: x0  03/05/24: Pt was seen for skilled ST services targeting suspected VCD. She reports she had a suspected VCD episode. She reports she was panicked and jumped up because she didn't want to pass out.  She tried to do the sniff sniff breathe, but wasn't able to do it completely. She reports she grabbed some water and this made it much easier to breathe. She reports it was not one of her worst episodes. She tried to do the  sniff sniff breath, but wasn't able to do it completely. She shared she has completely changed her diet to be more conscious of acid reflux triggers. What pt is describing seems more consistent with laryngospasm. SLP suggested the following re: sniff sniff breathe, sip of water. SLP found research on laryngospasm notch - SLP suggested trying this if other strategies don't work.  To recert and place on hold for continued practice of strategies to reduce likelihood of episode occurring.   Throat clear: [id number] Cough: x1   02/09/24: Pt was seen for skilled ST services targeting suspected VCD. She has not had any suspected VCD episodes. She reports when she starts to feel breathless, she employs her VCD strategies. She has practiced diaphragmatic breathing. She reports when she starts to breathe and it feels shallow - she may begin to feel a little anxious. When she starts her breathing exercises, the shallow feeling goes away.  She reports throat clears/coughing has really improved. Pt reports she has increased water tremendously and has been using sips of water and hard swallow to reduce. Pt is implementing all strategies provided independently. To reduce to x1 next month for check in before certification date is up. We will discuss next steps at that time.   Throat clears: x3 in 40 min session *NO COUGH  01/25/24: Pt was seen for skilled ST services targeting suspected VCD. Pt reports she saw a pulmonologist since  she has last been here. They are going to schedule a PFT to r/o other possible causes. She reports her coughing has been less frequent after starting to use the strategies. She attempted hard swallow and sips of water; though these have not totally eliminated episodes of coughing/throat clears.  TC: x6 *no coughing today Continued practice on diaphragmatic breathing. Requiring maxA verbal and visual cueing while attempting during sitting. Brief education on acid reflux and directed to [employer].   01/17/24: Pt was seen for skilled ST services targeting suspected VCD. Questions about how the breathing exercises work physiologically. Initiated session with diaphragmatic belly breathing - supine was able to achieve vs sitting. She has initiated acid reflux  and post nasal meds.  Cough x 5II  Educated on cough/throat clear substitution strategies. Pt reports she would be most likely to use re: hard swallow and sips of water.    01/10/24: Pt was seen for skilled ST services targeting suspected VCD. SLP initiated education on rescue breathing techniques with demonstration and practice. Pt required overall minA to complete sniff-breathe and overall maxA verbal/visual cues for diaphragmatic breathing. SLP provided hand out with instructions for continued home practice. Cough/throat clears were <10 (total) today.    PATIENT EDUCATION: Education details: VCD; chronic cough Person educated: Patient Education method: Explanation Education comprehension: verbalized understanding and needs further education   GOALS: Goals reviewed with patient? Yes  SHORT TERM GOALS: Target date: 02/04/24  Pt will identify (and attempt to reduce) acid reflux triggers  Baseline: Goal status: MET  2.  Pt will verbalize cough substitution strategies  Baseline:  Goal status: MET  3.  Pt will demonstrate rescue breathing techniques with minA Baseline:  Goal status: MET    LONG TERM GOALS: Target date:  03/06/24; 04/26/23  Pt will reduce cough/throat clears to <5 per session  Baseline:  Goal status: MET  2.  Pt will improve score on PROM Baseline:  Goal status: MET    ASSESSMENT:  CLINICAL IMPRESSION: Pt is a 83 yo female who presented to ST OP referred for possible  Vocal Cord Dysfunction. To discharge from therapy. No other instances have been observed. Pt feels comfortable with rescue breathing techniques. She is being followed by pulmonology. She understands she can return if needed with referral from provider. No other questions at this time.   @ eval: Pt endorses she would be talking and she would have a sudden episode where she could not INHALE or EXHALE - I am just stuck. She had one instance of choking a while back and she feels like these episodes are similar. Overall, she has had 3 episodes in the past 3 months. Episodes began with an inhale. SLP observed intermittent dysphonia - with strangled and hoarse vocal quality.  Pt was assessed using PROM measures re: [facility] Laryngeal Hypersensitivity Questionnaire, Vocal Cord Dysfunction Questionnaire, and case history. See above for results. Pt coughed and throat cleared for the entirety of today's session. SLP observed pt to cough x12 and throat clear x8 during today's 40 minute session. She reports this started several years ago and then went away. She reported it started up again around 4-6 months ago. Some coughs are productive, though not all. She also reports globus sensation. Denies any swallowing problems. SLP rec skilled ST services to address rescue breathing techniques, throat clear/cough substitution strategies and acid reflux management .     OBJECTIVE IMPAIRMENTS: include voice disorder. These impairments are limiting patient from effectively communicating at home and in community. Factors affecting potential to achieve goals and functional outcome are NA.SABRA Patient will benefit from skilled SLP services to address above  impairments and improve overall function.  REHAB POTENTIAL: Good  PLAN:  SLP FREQUENCY: 2  SLP DURATION: 8 weeks  PLANNED INTERVENTIONS: Environmental controls, Functional tasks, SLP instruction and feedback, Compensatory strategies, Patient/family education, and [id number] Treatment of speech (30 or 45 min)     [employer], CCC-SLP 04/22/2024, 11:06 AM      "

## 2024-04-26 ENCOUNTER — Other Ambulatory Visit (INDEPENDENT_AMBULATORY_CARE_PROVIDER_SITE_OTHER): Payer: Self-pay | Admitting: Otolaryngology

## 2024-04-26 ENCOUNTER — Other Ambulatory Visit: Payer: Self-pay | Admitting: Family Medicine

## 2024-05-03 ENCOUNTER — Telehealth (INDEPENDENT_AMBULATORY_CARE_PROVIDER_SITE_OTHER): Payer: Self-pay

## 2024-05-03 NOTE — Telephone Encounter (Signed)
 Per Dr. Roark I was calling patient to explain that we can not refill their medication until they are seen with a new provider before they can get a refill. Patient did not answer I left a voicemail letting the patient know to give us  a call back.

## 2024-05-03 NOTE — Telephone Encounter (Signed)
 Patient called back call was answered patient stated she would like to be seen in office. Looked at the schedule and confirmed an appointment for 05/09/2024 at 11:45 AM. Patient confirmed time.

## 2024-05-07 ENCOUNTER — Encounter: Payer: Self-pay | Admitting: Family Medicine

## 2024-05-07 ENCOUNTER — Ambulatory Visit: Admitting: Family Medicine

## 2024-05-07 VITALS — BP 114/72 | HR 76 | Temp 97.9°F | Resp 14 | Ht 64.0 in | Wt 161.4 lb

## 2024-05-07 DIAGNOSIS — E559 Vitamin D deficiency, unspecified: Secondary | ICD-10-CM

## 2024-05-07 DIAGNOSIS — R1310 Dysphagia, unspecified: Secondary | ICD-10-CM | POA: Diagnosis not present

## 2024-05-07 DIAGNOSIS — E039 Hypothyroidism, unspecified: Secondary | ICD-10-CM

## 2024-05-07 DIAGNOSIS — Z0000 Encounter for general adult medical examination without abnormal findings: Secondary | ICD-10-CM | POA: Diagnosis not present

## 2024-05-07 LAB — HEPATIC FUNCTION PANEL
ALT: 10 U/L (ref 3–35)
AST: 14 U/L (ref 5–37)
Albumin: 4 g/dL (ref 3.5–5.2)
Alkaline Phosphatase: 44 U/L (ref 39–117)
Bilirubin, Direct: 0.1 mg/dL (ref 0.1–0.3)
Total Bilirubin: 0.4 mg/dL (ref 0.2–1.2)
Total Protein: 7.3 g/dL (ref 6.0–8.3)

## 2024-05-07 LAB — CBC WITH DIFFERENTIAL/PLATELET
Basophils Absolute: 0 K/uL (ref 0.0–0.1)
Basophils Relative: 0.2 % (ref 0.0–3.0)
Eosinophils Absolute: 0.1 K/uL (ref 0.0–0.7)
Eosinophils Relative: 1.4 % (ref 0.0–5.0)
HCT: 35.7 % — ABNORMAL LOW (ref 36.0–46.0)
Hemoglobin: 11.8 g/dL — ABNORMAL LOW (ref 12.0–15.0)
Lymphocytes Relative: 39 % (ref 12.0–46.0)
Lymphs Abs: 1.9 K/uL (ref 0.7–4.0)
MCHC: 33 g/dL (ref 30.0–36.0)
MCV: 91.6 fl (ref 78.0–100.0)
Monocytes Absolute: 0.6 K/uL (ref 0.1–1.0)
Monocytes Relative: 12.9 % — ABNORMAL HIGH (ref 3.0–12.0)
Neutro Abs: 2.3 K/uL (ref 1.4–7.7)
Neutrophils Relative %: 46.5 % (ref 43.0–77.0)
Platelets: 246 K/uL (ref 150.0–400.0)
RBC: 3.89 Mil/uL (ref 3.87–5.11)
RDW: 14.8 % (ref 11.5–15.5)
WBC: 4.9 K/uL (ref 4.0–10.5)

## 2024-05-07 LAB — BASIC METABOLIC PANEL WITH GFR
BUN: 12 mg/dL (ref 6–23)
CO2: 29 meq/L (ref 19–32)
Calcium: 9 mg/dL (ref 8.4–10.5)
Chloride: 105 meq/L (ref 96–112)
Creatinine, Ser: 1 mg/dL (ref 0.40–1.20)
GFR: 52.67 mL/min — ABNORMAL LOW
Glucose, Bld: 74 mg/dL (ref 70–99)
Potassium: 4.6 meq/L (ref 3.5–5.1)
Sodium: 138 meq/L (ref 135–145)

## 2024-05-07 LAB — LIPID PANEL
Cholesterol: 189 mg/dL (ref 28–200)
HDL: 64.2 mg/dL
LDL Cholesterol: 109 mg/dL — ABNORMAL HIGH (ref 10–99)
NonHDL: 124.36
Total CHOL/HDL Ratio: 3
Triglycerides: 78 mg/dL (ref 10.0–149.0)
VLDL: 15.6 mg/dL (ref 0.0–40.0)

## 2024-05-07 LAB — TSH: TSH: 0.24 u[IU]/mL — ABNORMAL LOW (ref 0.35–5.50)

## 2024-05-07 LAB — VITAMIN D 25 HYDROXY (VIT D DEFICIENCY, FRACTURES): VITD: 53.07 ng/mL (ref 30.00–100.00)

## 2024-05-07 NOTE — Patient Instructions (Signed)
 Follow up in 6 months to recheck thyroid  We'll notify you of your lab results and make any changes if needed We'll call you to schedule your GI appt to evaluate swallowing Call with any questions or concerns Stay Safe!  Stay Healthy! Happy New Year!!

## 2024-05-07 NOTE — Progress Notes (Signed)
" ° °  Subjective:    Patient ID: Monica Diaz, female    DOB: Feb 05, 1941, 84 y.o.   MRN: [id number]  HPI CPE- UTD on mammo, PNA, flu.    Health Maintenance  Topic Date Due   DTaP/Tdap/Td (2 - Tdap) 03/18/2024   Mammogram  06/25/2024   COVID-19 Vaccine (8 - Pfizer risk 2025-26 season) 08/14/2024   Medicare Annual Wellness (AWV)  04/09/2025   Pneumococcal Vaccine: 50+ Years  Completed   Influenza Vaccine  Completed   Bone Density Scan  Completed   Zoster Vaccines- Shingrix  Completed   Meningococcal B Vaccine  Aged Out    Patient Care Team    Relationship Specialty Notifications Start End  Mahlon Comer BRAVO, MD PCP - General   04/07/10    Comment: Lavell Gores, Dedra, MD Consulting Physician Neurology  05/22/13   Kristie Lamprey, MD Consulting Physician Gastroenterology  08/27/15   Camella Fallow, MD Consulting Physician Orthopedic Surgery  09/14/16   Lavonia Lye, MD Consulting Physician Ophthalmology  10/31/23       Review of Systems Patient reports no vision/hearing changes, adenopathy,fever, weight change, chest pain, palpitations, edema, persistant/recurrent cough, hemoptysis, dyspnea (rest/exertional/paroxysmal nocturnal), gastrointestinal bleeding (melena, rectal bleeding), abdominal pain, significant heartburn, bowel changes, GU symptoms (dysuria, hematuria, incontinence), Gyn symptoms (abnormal  bleeding, pain),  syncope, focal weakness, memory loss, numbness & tingling, skin/hair/nail changes, abnormal bruising or bleeding, anxiety, or depression.   + ongoing hoarseness- known vocal cord dysfxn + 'breathing episodes'- when inhaling has the sensation that something is 'stuck' and she is unable to exhale.  Has seen ENT, Pulmonary, and Speech Tx.  Episodes have cleared with water. + intermittent dysphagia- can occur w/ own salvia/secretions    Objective:   Physical Exam General Appearance:    Alert, cooperative, no distress, appears stated age  Head:    Normocephalic,  without obvious abnormality, atraumatic  Eyes:    PERRL, conjunctiva/corneas clear, EOM's intact both eyes  Ears:    Normal TM's and external ear canals, both ears  Nose:   Nares normal, septum midline, mucosa normal, no drainage    or sinus tenderness  Throat:   Lips, mucosa, and tongue normal; teeth and gums normal  Neck:   Supple, symmetrical, trachea midline, no adenopathy;    Thyroid : no enlargement/tenderness/nodules  Back:     Symmetric, no curvature, ROM normal, no CVA tenderness  Lungs:     Clear to auscultation bilaterally, respirations unlabored  Chest Wall:    No tenderness or deformity   Heart:    Regular rate and rhythm, S1 and S2 normal, no murmur, rub   or gallop  Breast Exam:    Deferred to GYN  Abdomen:     Soft, non-tender, bowel sounds active all four quadrants,    no masses, no organomegaly  Genitalia:    Deferred to GYN  Rectal:    Extremities:   Extremities normal, atraumatic, no cyanosis or edema  Pulses:   2+ and symmetric all extremities  Skin:   Skin color, texture, turgor normal, no rashes or lesions  Lymph nodes:   Cervical, supraclavicular, and axillary nodes normal  Neurologic:   CNII-XII intact, normal strength, sensation and reflexes    throughout          Assessment & Plan:    "

## 2024-05-07 NOTE — Assessment & Plan Note (Signed)
 Pt's PE WNL.  UTD on mammo, PNA, flu.  Due for Tdap- will get at pharmacy.  Check labs.  Anticipatory guidance provided.

## 2024-05-08 ENCOUNTER — Ambulatory Visit: Payer: Self-pay | Admitting: Family Medicine

## 2024-05-09 ENCOUNTER — Encounter (INDEPENDENT_AMBULATORY_CARE_PROVIDER_SITE_OTHER): Payer: Self-pay

## 2024-05-09 ENCOUNTER — Ambulatory Visit (INDEPENDENT_AMBULATORY_CARE_PROVIDER_SITE_OTHER)

## 2024-05-09 VITALS — BP 82/59 | HR 94 | Ht 64.0 in | Wt 161.0 lb

## 2024-05-09 DIAGNOSIS — J310 Chronic rhinitis: Secondary | ICD-10-CM

## 2024-05-09 DIAGNOSIS — R0982 Postnasal drip: Secondary | ICD-10-CM

## 2024-05-09 MED ORDER — LEVOCETIRIZINE DIHYDROCHLORIDE 5 MG PO TABS: 5.0000 mg | ORAL_TABLET | Freq: Every evening | ORAL | 6 refills | Status: AC

## 2024-05-09 NOTE — Progress Notes (Signed)
 Dear Dr. Mahlon, Here is my assessment for our mutual patient, Monica Diaz. Thank you for allowing me the opportunity to care for your patient. Please do not hesitate to contact me should you have any other questions. Sincerely, Dr. Hadassah Parody  Otolaryngology Clinic Note Referring provider: Dr. Mahlon HPI:   Initial HPI (01/02/24 - Okey) Monica Diaz is an 84 year old female who presents with episodes of shortness of breath. She was referred by her primary care doctor for evaluation of her breathing difficulties.  She has experienced episodes of shortness of breath over the past four years, initially triggered by having a piece of candy in her mouth. Subsequent episodes occurred without any oral obstruction. During these episodes, she experiences difficulty inhaling and is unable to exhale, requiring assistance from others to recover. The episodes are sporadic, with the most recent ones occurring between June and early August of this year. She describes the sensation as an inability to exhale after inhaling, without any associated choking.  She has been prescribed medication for reflux and a nasal spray, which she has not yet started using. No choking occurs during the episodes of shortness of breath.  --------------------------------------------------------- 05/09/2024  84 year old female here for follow-up.  Shortness of breath episodes are better.  She is still having some postnasal drip.  She is unsure if she needs to continue her Xyzal . She did recently have a upper respiratory tract infection and having more postnasal drip from this.  Says she is unsure if the postnasal drip is due to stopping her Xyzal  or because she has been sick.    Independent Review of Additional Tests or Records:  04/22/2024 speech therapy note reviewed Monica Diaz CCC-SLP: Patient feels that her goals of all been met, discharge from voice therapy  CBC 05/07/2024 eosinophils  1.4  01/24/2024 chest x-ray reviewed showing no acute abnormalities PMH/Meds/All/SocHx/FamHx/ROS:   Past Medical History:  Diagnosis Date   Allergy    Anemia    Anxiety    Arthritis    Breast cancer (HCC) 2015   Cancer (HCC) 2015   breast   Cataract    GERD (gastroesophageal reflux disease)    Glaucoma    HOH (hard of hearing)    Hx of radiation therapy 07/18/13-08/14/13   right breast total dose 50 Gy   Hyperlipidemia    Neuromuscular disorder (HCC)    Osteoporosis    Personal history of radiation therapy 2015   Seasonal allergies    Sleep apnea    uses a cpap   Thyroid  disease    Wears glasses      Past Surgical History:  Procedure Laterality Date   ABDOMINAL HYSTERECTOMY     fibroids   BREAST BIOPSY Right 2015   core    BREAST BIOPSY Left 06/18/2018   DENSE FIBROSIS WITH CALCIFICATIONS AND CHRONIC   BREAST BIOPSY Left 06/07/2017   BREAST EXCISIONAL BIOPSY Right 2005   BREAST LUMPECTOMY Right    cataract surgery Left 10/04/2023   COLONOSCOPY     EYE SURGERY     LEFT OOPHORECTOMY     PARTIAL MASTECTOMY WITH NEEDLE LOCALIZATION AND AXILLARY SENTINEL LYMPH NODE BX Right 05/28/2013   Procedure: PARTIAL MASTECTOMY WITH NEEDLE LOCALIZATION AND AXILLARY SENTINEL LYMPH NODE BIOPSY;  Surgeon: Elon CHRISTELLA Pacini, MD;  Location: [facility];  Service: General;  Laterality: Right;    Family History  Problem Relation Age of Onset   Hypertension Mother    Diabetes Maternal Aunt  Stroke Other        grandfather   Cancer Maternal Uncle    Breast cancer Neg Hx      Social Connections: Moderately Integrated (04/09/2024)   Social Connection and Isolation Panel    Frequency of Communication with Friends and Family: More than three times a week    Frequency of Social Gatherings with Friends and Family: Once a week    Attends Religious Services: More than 4 times per year    Active Member of [employer] or Organizations: Yes    Attends Engineer, Structural:  More than 4 times per year    Marital Status: Divorced     Current Outpatient Medications  Medication Instructions   alendronate  (FOSAMAX ) 70 MG tablet TAKE 1 TABLET BY MOUTH 1 TIME EVERY WEEK WITH A FULL GLASS OF WATER AND ON AN EMPTY STOMACH   ascorbic acid (VITAMIN C) 500 mg, 2 times daily   calcium  citrate-vitamin D  (CITRACAL+D) 315-200 MG-UNIT per tablet 1 tablet, 2 times daily   Coenzyme Q10 (COQ10) 200 MG CAPS 1 capsule, Daily at bedtime   esomeprazole  (NEXIUM ) 40 MG capsule TAKE 1 CAPSULE(40 MG) BY MOUTH DAILY   fluticasone  (FLONASE ) 50 MCG/ACT nasal spray 2 sprays, Each Nare, 2 times daily   hydroxypropyl methylcellulose / hypromellose (ISOPTO TEARS / GONIOVISC) 2.5 % ophthalmic solution 1 drop, As needed   levocetirizine (XYZAL ) 5 mg, Oral, Every evening   levothyroxine  (SYNTHROID ) 100 MCG tablet TAKE 1 TABLET(100 MCG) BY MOUTH DAILY   MAGNESIUM  CITRATE PO 250 mg, Daily   Multiple Vitamins-Minerals (ZINC PO) Take by mouth.   pyridoxine (B-6) 100 mg, 2 times daily   Red Yeast Rice Extract ([employer] RED YEAST RICE) 1,200 mg, Oral, 2 times daily   vitamin B-12 (CYANOCOBALAMIN ) 50 mcg   Zinc 50 mg, Daily     Physical Exam:   BP (!) 82/59 (BP Location: Left Arm, Patient Position: Sitting, Cuff Size: Large)   Pulse 94   Ht 5' 4 (1.626 m)   Wt 161 lb (73 kg)   SpO2 91%   BMI 27.64 kg/m   Salient findings:  CN II-XII intact   Bilateral EAC clear and TM intact with well pneumatized middle ear spaces  Anterior rhinoscopy: bilateral inferior turbinates with hypertrophy  No lesions of oral cavity/oropharynx  No obviously palpable neck masses/lymphadenopathy/thyromegaly  No respiratory distress or stridor  Seprately Identifiable Procedures:  Prior to initiating any procedures, risks/benefits/alternatives were explained to the patient and verbal consent obtained. None  Impression & Plans:  Monica Diaz is a 84 y.o. female with   1. Chronic rhinitis   2. Post-nasal  drip     Assessment and Plan Assessment & Plan Chronic rhinitis Chronic rhinitis with intermittent postnasal drainage and pharyngeal mucus, currently improved and without acute symptoms. Mucus is typically clear unless associated with an upper respiratory infection. Examination revealed no concerning findings. She prefers to minimize unnecessary medication use. - Recommended continued use of fluticasone  nasal spray. - Prescribed levocetirizine with instructions to defer use until after recovery from her current upper respiratory infection. - Advised to restart levocetirizine if postnasal drainage or pharyngeal mucus recurs after resolution of her cold. - Provided anticipatory guidance that levocetirizine may reduce drainage if needed. - Emphasized avoidance of unnecessary medication if symptoms are controlled. - Instructed to request refills as needed and to return for follow-up only if symptoms worsen.   See below regarding exact medications prescribed this encounter including dosages and route: Meds ordered this  encounter  Medications   levocetirizine (XYZAL ) 5 MG tablet    Sig: Take 1 tablet (5 mg total) by mouth every evening.    Dispense:  30 tablet    Refill:  6    Thank you for allowing me the opportunity to care for your patient. Please do not hesitate to contact me should you have any other questions.  Sincerely, Hadassah Parody, MD Otolaryngologist (ENT), [facility] Phone: [phone] Fax: [phone]  MDM:  Level 4 Complexity/Problems addressed: -  Data complexity: 4-  independent review of SLP note, 1 lab, CXR  - Morbidity: 4- med mgmt   - Prescription Drug prescribed or managed: yes

## 2024-05-16 ENCOUNTER — Other Ambulatory Visit: Payer: Self-pay | Admitting: Family Medicine

## 2024-05-16 DIAGNOSIS — Z1231 Encounter for screening mammogram for malignant neoplasm of breast: Secondary | ICD-10-CM

## 2024-06-28 ENCOUNTER — Ambulatory Visit

## 2024-07-15 ENCOUNTER — Ambulatory Visit
